# Patient Record
Sex: Male | Born: 1950
Health system: Southern US, Community
[De-identification: ages and names within clinical notes are randomized; demographics above are authoritative.]

## PROBLEM LIST (undated history)

## (undated) DIAGNOSIS — Z860100 Personal history of colon polyps, unspecified: Secondary | ICD-10-CM

## (undated) DIAGNOSIS — E785 Hyperlipidemia, unspecified: Secondary | ICD-10-CM

## (undated) DIAGNOSIS — E559 Vitamin D deficiency, unspecified: Secondary | ICD-10-CM

## (undated) DIAGNOSIS — G5603 Carpal tunnel syndrome, bilateral upper limbs: Secondary | ICD-10-CM

## (undated) DIAGNOSIS — T7840XA Allergy, unspecified, initial encounter: Secondary | ICD-10-CM

## (undated) DIAGNOSIS — G4730 Sleep apnea, unspecified: Secondary | ICD-10-CM

## (undated) DIAGNOSIS — I10 Essential (primary) hypertension: Secondary | ICD-10-CM

## (undated) DIAGNOSIS — S8290XA Unspecified fracture of unspecified lower leg, initial encounter for closed fracture: Secondary | ICD-10-CM

## (undated) HISTORY — DX: Allergy, unspecified, initial encounter: T78.40XA

## (undated) HISTORY — PX: TOTAL ANKLE REPLACEMENT: SUR1218

## (undated) HISTORY — DX: Vitamin D deficiency, unspecified: E55.9

## (undated) HISTORY — DX: Personal history of colon polyps, unspecified: Z86.0100

## (undated) HISTORY — DX: Essential (primary) hypertension: I10

## (undated) HISTORY — DX: Hyperlipidemia, unspecified: E78.5

## (undated) HISTORY — PX: CYST REMOVAL HAND: SHX6279

## (undated) HISTORY — DX: Personal history of colonic polyps: Z86.010

## (undated) HISTORY — DX: Unspecified fracture of unspecified lower leg, initial encounter for closed fracture: S82.90XA

## (undated) HISTORY — DX: Carpal tunnel syndrome, bilateral upper limbs: G56.03

## (undated) HISTORY — DX: Sleep apnea, unspecified: G47.30

---

## 1969-02-12 HISTORY — PX: LEG SURGERY: SHX1003

## 1997-02-12 HISTORY — PX: SPINE SURGERY: SHX786

## 1997-09-06 ENCOUNTER — Encounter: Admission: RE | Admit: 1997-09-06 | Discharge: 1997-12-05 | Payer: Self-pay | Admitting: Neurological Surgery

## 1999-06-27 ENCOUNTER — Emergency Department (HOSPITAL_COMMUNITY): Admission: EM | Admit: 1999-06-27 | Discharge: 1999-06-27 | Payer: Self-pay | Admitting: Emergency Medicine

## 1999-06-27 ENCOUNTER — Encounter: Payer: Self-pay | Admitting: Emergency Medicine

## 1999-09-06 ENCOUNTER — Encounter: Payer: Self-pay | Admitting: Neurological Surgery

## 1999-09-06 ENCOUNTER — Encounter: Admission: RE | Admit: 1999-09-06 | Discharge: 1999-09-06 | Payer: Self-pay | Admitting: Neurological Surgery

## 1999-09-27 ENCOUNTER — Encounter: Payer: Self-pay | Admitting: Neurological Surgery

## 1999-09-27 ENCOUNTER — Ambulatory Visit (HOSPITAL_COMMUNITY): Admission: RE | Admit: 1999-09-27 | Discharge: 1999-09-27 | Payer: Self-pay | Admitting: Neurological Surgery

## 2001-07-14 ENCOUNTER — Encounter: Payer: Self-pay | Admitting: Orthopedic Surgery

## 2001-07-14 ENCOUNTER — Ambulatory Visit (HOSPITAL_COMMUNITY): Admission: RE | Admit: 2001-07-14 | Discharge: 2001-07-14 | Payer: Self-pay | Admitting: Orthopedic Surgery

## 2010-01-19 ENCOUNTER — Emergency Department (HOSPITAL_COMMUNITY): Admission: EM | Admit: 2010-01-19 | Discharge: 2009-08-30 | Payer: Self-pay | Admitting: Emergency Medicine

## 2012-05-28 ENCOUNTER — Encounter: Payer: Self-pay | Admitting: Family Medicine

## 2012-05-28 ENCOUNTER — Ambulatory Visit (INDEPENDENT_AMBULATORY_CARE_PROVIDER_SITE_OTHER): Payer: 59 | Admitting: Family Medicine

## 2012-05-28 VITALS — BP 133/83 | HR 69 | Temp 97.4°F | Ht 67.0 in | Wt 207.8 lb

## 2012-05-28 DIAGNOSIS — Z23 Encounter for immunization: Secondary | ICD-10-CM

## 2012-05-28 DIAGNOSIS — W57XXXA Bitten or stung by nonvenomous insect and other nonvenomous arthropods, initial encounter: Secondary | ICD-10-CM

## 2012-05-28 DIAGNOSIS — R5381 Other malaise: Secondary | ICD-10-CM

## 2012-05-28 DIAGNOSIS — E559 Vitamin D deficiency, unspecified: Secondary | ICD-10-CM

## 2012-05-28 DIAGNOSIS — Z139 Encounter for screening, unspecified: Secondary | ICD-10-CM

## 2012-05-28 DIAGNOSIS — T148 Other injury of unspecified body region: Secondary | ICD-10-CM

## 2012-05-28 DIAGNOSIS — Z2911 Encounter for prophylactic immunotherapy for respiratory syncytial virus (RSV): Secondary | ICD-10-CM

## 2012-05-28 DIAGNOSIS — R5383 Other fatigue: Secondary | ICD-10-CM

## 2012-05-28 DIAGNOSIS — E785 Hyperlipidemia, unspecified: Secondary | ICD-10-CM

## 2012-05-28 LAB — POCT CBC
Hemoglobin: 14.9 g/dL (ref 14.1–18.1)
MPV: 7.3 fL (ref 0–99.8)
POC Granulocyte: 4.4 (ref 2–6.9)
Platelet Count, POC: 244 10*3/uL (ref 142–424)
RBC: 5.1 M/uL (ref 4.69–6.13)

## 2012-05-28 LAB — HEPATIC FUNCTION PANEL
Albumin: 4.4 g/dL (ref 3.5–5.2)
Alkaline Phosphatase: 76 U/L (ref 39–117)
Indirect Bilirubin: 0.5 mg/dL (ref 0.0–0.9)
Total Bilirubin: 0.7 mg/dL (ref 0.3–1.2)

## 2012-05-28 LAB — BASIC METABOLIC PANEL WITH GFR
Chloride: 101 mEq/L (ref 96–112)
GFR, Est Non African American: >89 mL/min
Potassium: 4.5 mEq/L (ref 3.5–5.3)

## 2012-05-28 MED ORDER — ZOSTER VACCINE LIVE 19400 UNT/0.65ML ~~LOC~~ SOLR: 0.6500 mL | Freq: Once | SUBCUTANEOUS | Status: DC

## 2012-05-28 NOTE — Patient Instructions (Addendum)
Continue current meds and therapeutic lifestyle changes Herpes Zoster Virus Vaccine What is this medicine? HERPES ZOSTER VIRUS VACCINE (HUR peez ZOS ter vahy ruhs vak SEEN) is a vaccine. It is used to prevent shingles in adults 64 years old and over. This vaccine is not used to treat shingles or nerve pain from shingles. This medicine may be used for other purposes; ask your health care provider or pharmacist if you have questions. What should I tell my health care provider before I take this medicine? They need to know if you have any of these conditions: -cancer like leukemia or lymphoma -immune system problems or therapy -infection with fever -tuberculosis -an unusual or allergic reaction to vaccines, neomycin, gelatin, other medicines, foods, dyes, or preservatives -pregnant or trying to get pregnant -breast-feeding How should I use this medicine? This vaccine is for injection under the skin. It is given by a health care professional. Talk to your pediatrician regarding the use of this medicine in children. This medicine is not approved for use in children. Overdosage: If you think you have taken too much of this medicine contact a poison control center or emergency room at once. NOTE: This medicine is only for you. Do not share this medicine with others. What if I miss a dose? This does not apply. What may interact with this medicine? Do not take this medicine with any of the following medications: -adalimumab -anakinra -etanercept -infliximab -medicines to treat cancer -medicines that suppress your immune system This medicine may also interact with the following medications: -immunoglobulins -steroid medicines like prednisone or cortisone This list may not describe all possible interactions. Give your health care provider a list of all the medicines, herbs, non-prescription drugs, or dietary supplements you use. Also tell them if you smoke, drink alcohol, or use illegal drugs.  Some items may interact with your medicine. What should I watch for while using this medicine? Visit your doctor for regular check ups. This vaccine, like all vaccines, may not fully protect everyone. After receiving this vaccine it may be possible to pass chickenpox infection to others. Avoid people with immune system problems, pregnant women who have not had chickenpox, and newborns of women who have not had chickenpox. Talk to your doctor for more information. What side effects may I notice from receiving this medicine? Side effects that you should report to your doctor or health care professional as soon as possible: -allergic reactions like skin rash, itching or hives, swelling of the face, lips, or tongue -breathing problems -feeling faint or lightheaded, falls -fever, flu-like symptoms -pain, tingling, numbness in the hands or feet -swelling of the ankles, feet, hands -unusually weak or tired Side effects that usually do not require medical attention (report to your doctor or health care professional if they continue or are bothersome): -aches or pains -chickenpox-like rash -diarrhea -headache -loss of appetite -nausea, vomiting -redness, pain, swelling at site where injected -runny nose This list may not describe all possible side effects. Call your doctor for medical advice about side effects. You may report side effects to FDA at 1-800-FDA-1088. Where should I keep my medicine? This drug is given in a hospital or clinic and will not be stored at home. NOTE: This sheet is a summary. It may not cover all possible information. If you have questions about this medicine, talk to your doctor, pharmacist, or health care provider.  2013, [employer]vier/[employer]. (07/18/2009 5:43:50 PM)

## 2012-05-28 NOTE — Progress Notes (Signed)
  Subjective:    Patient ID: Daniel Macias, male    DOB: 1950-11-14, 62 y.o.   MRN: [id number]  HPI This patient presents for recheck of multiple medical problems. No one accompanies the patient today.  Patient Active Problem List  Diagnosis  . Hyperlipemia      The allergies, current medications, past medical history, surgical history, family and social history are reviewed.  Immunizations reviewed.  Health maintenance reviewed.  The following items are outstanding: Tdap and Zostavax      Review of Systems  HENT: Negative.   Eyes: Negative.   Respiratory: Negative.   Cardiovascular: Negative.   Gastrointestinal: Negative.   Genitourinary: Negative.   Musculoskeletal: Negative.   Skin: Negative.   Neurological: Negative.   Psychiatric/Behavioral: Negative.        Objective:   Physical Exam BP 133/83  Pulse 69  Temp(Src) 97.4 F (36.3 C) (Oral)  Ht 5\' 7"  (1.702 m)  Wt 207 lb 12.8 oz (94.257 kg)  BMI 32.54 kg/m2  The patient appeared well nourished and normally developed, alert and oriented to time and place. Speech, behavior and judgement appear normal. Vital signs as documented.  Head exam is unremarkable. No scleral icterus or pallor noted. Slight nasal congestion. Neck is without jugular venous distension, thyromegally, or carotid bruits. Carotid upstrokes are brisk bilaterally. No cervical adenopathy. Lungs are clear anteriorly and posteriorly to auscultation. Normal respiratory effort. Cardiac exam reveals regular rate and rhythm @ 72/min. First and second heart sounds normal. No murmurs, rubs or gallops.  Abdominal exam reveals normal bowl sounds, no masses, no organomegaly and no aortic enlargement. No inguinal adenopathy. Extremities are nonedematous and both femoral  pulses are normal. Skin without pallor or jaundice.  Warm and dry, without rash. Multiple bite sites on the trunk  from tick removal. Neurologic exam reveals normal deep tendon reflexes  and normal sensation.          Assessment & Plan:  1. Hyperlipemia - Hepatic function panel; Standing - NMR Lipoprofile with Lipids; Standing  2. Screening - POCT CBC; Standing - BASIC METABOLIC PANEL WITH GFR; Standing  3. Vitamin D deficiency disease - Vitamin D 25 hydroxy; Standing  4.Tick bites  5.Fatigue -Sleep apnea evaluation to be ordered  6. We'll give shingles shot today     Check on the state registry for Tdap

## 2012-05-29 LAB — NMR LIPOPROFILE WITH LIPIDS
HDL Size: 9.2 nm (ref >=9.2)
HDL-C: 59 mg/dL (ref >=40)
LDL (calc): 51 mg/dL (ref <100)
LDL Particle Number: 711 nmol/L (ref <1000)
Triglycerides: 83 mg/dL (ref <150)
VLDL Size: 46.3 nm (ref <=46.6)

## 2012-05-29 LAB — VITAMIN D 25 HYDROXY (VIT D DEFICIENCY, FRACTURES): Vit D, 25-Hydroxy: 24 ng/mL — ABNORMAL LOW (ref 30–89)

## 2012-06-03 ENCOUNTER — Encounter: Payer: Self-pay | Admitting: *Deleted

## 2012-06-03 ENCOUNTER — Ambulatory Visit: Payer: 59 | Attending: Family Medicine | Admitting: Sleep Medicine

## 2012-06-03 VITALS — Ht 68.0 in | Wt 201.0 lb

## 2012-06-03 DIAGNOSIS — R5383 Other fatigue: Secondary | ICD-10-CM

## 2012-06-03 DIAGNOSIS — G4733 Obstructive sleep apnea (adult) (pediatric): Secondary | ICD-10-CM | POA: Insufficient documentation

## 2012-06-03 DIAGNOSIS — Z6831 Body mass index (BMI) 31.0-31.9, adult: Secondary | ICD-10-CM | POA: Insufficient documentation

## 2012-06-03 NOTE — Progress Notes (Signed)
Quick Note:  Letter of blood work results sent to patientt ______

## 2012-06-04 NOTE — Progress Notes (Signed)
Split night protocol not met d/t AHI <15 in 1st 2 hrs total sleep time and at @2  AM (cut off time for Split night)

## 2012-06-07 NOTE — Procedures (Signed)
[facility] Daniel Toto A. Gerilyn Pilgrim, MD     [email or web address]        NAMERYKIN, ROUTE              ACCOUNT NO.:  [id number]  MEDICAL RECORD NO.:  [id number]          PATIENT TYPE:  OUT  LOCATION:  SLEEP LAB                     FACILITY:  [facility]  PHYSICIAN:  Taksh Hjort A. Gerilyn Macias, M.D. DATE OF BIRTH:  1950/04/07  DATE OF STUDY:  06/03/2012                           NOCTURNAL POLYSOMNOGRAM  REFERRING PHYSICIAN:  Ernestina Penna, M.D.  INDICATIONS:  This is a 62 year old man who presents with restless sleep hypersomnia and snoring.  MEDICATIONS:  Vitamin D and Crestor.  EPWORTH SLEEPINESS SCALE:  18.  BMI:  31.  ARCHITECTURAL SUMMARY:  The total recording time is 421 minutes, sleep latency is 9 minutes.  REM latency 75 minutes.  Stage N1 11%, N2 69%, N3 3%, and REM sleep 17%.  RESPIRATORY SUMMARY:  Baseline oxygen saturation is 94%, lowest saturation 74% during REM sleep.  Diagnostic AHI is 13 and RDI 14.  The patient had more events during REM sleep associated with even lower desaturation.  The REM AHI is 30.  LIMB MOVEMENT SUMMARY:  PLM index 0.  ELECTROCARDIOGRAM SUMMARY:  Average heart rate is 77 with no significant dysrhythmias observed.  IMPRESSION:  Mild-to-moderate obstructive sleep apnea syndrome worse during REM sleep.  RECOMMENDATION:  Formal CPAP titration study.  Thanks for this referral.    Lanisha Stepanian A. Gerilyn Macias, M.D.    KAD/MEDQ  D:  06/07/2012 19:43:35  T:  06/07/2012 16:10:96  Job:  [id number]

## 2012-06-11 ENCOUNTER — Telehealth: Payer: Self-pay | Admitting: Family Medicine

## 2012-06-12 NOTE — Telephone Encounter (Signed)
Pt notified of results of labs and sleep study RX for Vit D called into [facility]

## 2012-10-01 ENCOUNTER — Encounter: Payer: Self-pay | Admitting: *Deleted

## 2012-11-25 ENCOUNTER — Encounter (INDEPENDENT_AMBULATORY_CARE_PROVIDER_SITE_OTHER): Payer: Self-pay

## 2012-11-25 ENCOUNTER — Encounter: Payer: Self-pay | Admitting: Family Medicine

## 2012-11-25 ENCOUNTER — Ambulatory Visit (INDEPENDENT_AMBULATORY_CARE_PROVIDER_SITE_OTHER): Payer: 59 | Admitting: Family Medicine

## 2012-11-25 VITALS — BP 128/84 | HR 91 | Temp 97.3°F | Ht 68.0 in | Wt 208.0 lb

## 2012-11-25 DIAGNOSIS — E785 Hyperlipidemia, unspecified: Secondary | ICD-10-CM

## 2012-11-25 DIAGNOSIS — Z0000 Encounter for general adult medical examination without abnormal findings: Secondary | ICD-10-CM

## 2012-11-25 DIAGNOSIS — G4733 Obstructive sleep apnea (adult) (pediatric): Secondary | ICD-10-CM | POA: Insufficient documentation

## 2012-11-25 DIAGNOSIS — R7309 Other abnormal glucose: Secondary | ICD-10-CM

## 2012-11-25 DIAGNOSIS — N400 Enlarged prostate without lower urinary tract symptoms: Secondary | ICD-10-CM | POA: Insufficient documentation

## 2012-11-25 DIAGNOSIS — G4730 Sleep apnea, unspecified: Secondary | ICD-10-CM

## 2012-11-25 DIAGNOSIS — R799 Abnormal finding of blood chemistry, unspecified: Secondary | ICD-10-CM

## 2012-11-25 DIAGNOSIS — E559 Vitamin D deficiency, unspecified: Secondary | ICD-10-CM

## 2012-11-25 DIAGNOSIS — Z23 Encounter for immunization: Secondary | ICD-10-CM

## 2012-11-25 DIAGNOSIS — R7989 Other specified abnormal findings of blood chemistry: Secondary | ICD-10-CM

## 2012-11-25 LAB — POCT CBC
HCT, POC: 46 % (ref 43.5–53.7)
MCHC: 33.2 g/dL (ref 31.8–35.4)
MCV: 85.9 fL (ref 80–97)
RDW, POC: 12.8 %
WBC: 7.2 10*3/uL (ref 4.6–10.2)

## 2012-11-25 NOTE — Patient Instructions (Signed)
Continue current medications. Continue good therapeutic lifestyle changes.  Fall precautions discussed with patient. Follow up as planned and earlier as needed.   

## 2012-11-25 NOTE — Progress Notes (Signed)
Subjective:    Patient ID: Daniel Macias, male    DOB: January 28, 1951, 62 y.o.   MRN: [id number]  HPI Pt here for follow up and management of chronic medical problems and annual exam. Patient is doing well and he has no particular complaints. The patient's family history was reviewed with him today. Both parents died at 32 years of age. His father died of COPD. His mother had a history of hypertension and most likely died from metastatic uterine cancer .     Patient Active Problem List   Diagnosis Date Noted  . Hyperlipemia 05/28/2012  . Vitamin D deficiency disease 05/28/2012   Outpatient Encounter Prescriptions as of 11/25/2012  Medication Sig Dispense Refill  . cholecalciferol (VITAMIN D) 1000 UNITS tablet Take 1,000 Units by mouth daily.      . Probiotic Product (PROBIOTIC DAILY PO) Take 1 capsule by mouth daily.      . rosuvastatin (CRESTOR) 10 MG tablet Take 10 mg by mouth daily.       No facility-administered encounter medications on file as of 11/25/2012.    Review of Systems  Constitutional: Negative.   HENT: Negative.   Eyes: Negative.   Respiratory: Negative.   Cardiovascular: Negative.   Gastrointestinal: Negative.   Endocrine: Negative.   Genitourinary: Negative.   Musculoskeletal: Negative.   Skin: Negative.   Allergic/Immunologic: Negative.   Neurological: Negative.   Hematological: Negative.   Psychiatric/Behavioral: Negative.        Objective:   Physical Exam  Nursing note and vitals reviewed. Constitutional: He is oriented to person, place, and time. He appears well-developed and well-nourished.  HENT:  Head: Normocephalic and atraumatic.  Right Ear: External ear normal.  Left Ear: External ear normal.  Nose: Nose normal.  Mouth/Throat: Oropharynx is clear and moist. No oropharyngeal exudate.  Eyes: Conjunctivae and EOM are normal. Pupils are equal, round, and reactive to light. Right eye exhibits no discharge. Left eye exhibits no discharge. No  scleral icterus.  Neck: Normal range of motion. Neck supple. No tracheal deviation present. No thyromegaly present.  No carotid bruits  Cardiovascular: Normal rate, regular rhythm, normal heart sounds and intact distal pulses.  Exam reveals no gallop and no friction rub.   No murmur heard. At 72 per minute  Pulmonary/Chest: Effort normal and breath sounds normal. No respiratory distress. He has no wheezes. He has no rales. He exhibits no tenderness.  Abdominal: Soft. Bowel sounds are normal. He exhibits no distension and no mass. There is no tenderness. There is no rebound and no guarding.  Genitourinary: Rectum normal and penis normal.  Prostate was slightly enlarged. There were no nodules or lumps. There were no rectal masses. There was no inguinal hernia palpated.  Musculoskeletal: Normal range of motion. He exhibits no edema and no tenderness.  Lymphadenopathy:    He has no cervical adenopathy.  Neurological: He is alert and oriented to person, place, and time. He has normal reflexes. No cranial nerve deficit.  Skin: Skin is warm and dry. No rash noted. He is not diaphoretic. No erythema. No pallor.  Psychiatric: He has a normal mood and affect. His behavior is normal. Judgment and thought content normal.   BP 128/84  Pulse 91  Temp(Src) 97.3 F (36.3 C) (Oral)  Ht 5\' 8"  (1.727 m)  Wt 208 lb (94.348 kg)  BMI 31.63 kg/m2        Assessment & Plan:   1. Annual physical exam   2. Vitamin D deficiency disease  3. Hyperlipidemia   4. Need for Tdap vaccination   5. Unspecified sleep apnea   6. BPH (benign prostatic hyperplasia)   7. Obstructive sleep apnea    Orders Placed This Encounter  Procedures  . Tdap vaccine greater than or equal to 7yo IM  . Hepatic function panel  . BMP8+EGFR  . NMR, lipoprofile  . Vit D  25 hydroxy (rtn osteoporosis monitoring)  . PSA, total and free  . POCT CBC  . Cpap titration    Standing Status: Future     Number of Occurrences:       Standing Expiration Date: 11/25/2013    Order Specific Question:  Where should this test be performed:    Answer:  [facility]   Meds ordered this encounter  Medications  . Probiotic Product (PROBIOTIC DAILY PO)    Sig: Take 1 capsule by mouth daily.   Patient Instructions  Continue current medications. Continue good therapeutic lifestyle changes.  Fall precautions discussed with patient. Follow up as planned and earlier as needed.     We will arrange for followup visit with the neurologist for the additional sleep study that is needed  Nyra Capes MD

## 2012-11-27 ENCOUNTER — Ambulatory Visit: Payer: 59 | Admitting: Family Medicine

## 2012-11-27 LAB — NMR, LIPOPROFILE
HDL Particle Number: 40.9 umol/L (ref >=30.5)
LDL Size: 21.5 nm (ref >20.5)
LDLC SERPL CALC-MCNC: 63 mg/dL (ref <100)
LP-IR Score: 34 (ref <=45)

## 2012-11-27 LAB — BMP8+EGFR
BUN/Creatinine Ratio: 16 (ref 10–22)
BUN: 15 mg/dL (ref 8–27)
CO2: 22 mmol/L (ref 18–29)
Chloride: 103 mmol/L (ref 97–108)
Glucose: 122 mg/dL — ABNORMAL HIGH (ref 65–99)
Potassium: 5 mmol/L (ref 3.5–5.2)

## 2012-11-27 LAB — HEPATIC FUNCTION PANEL
AST: 21 IU/L (ref 0–40)
Albumin: 4.6 g/dL (ref 3.6–4.8)
Alkaline Phosphatase: 85 IU/L (ref 39–117)
Bilirubin, Direct: 0.11 mg/dL (ref 0.00–0.40)

## 2012-11-27 LAB — PSA, TOTAL AND FREE: PSA, Free Pct: 41.7 %

## 2012-11-27 LAB — VITAMIN D 25 HYDROXY (VIT D DEFICIENCY, FRACTURES): Vit D, 25-Hydroxy: 31.9 ng/mL (ref 30.0–100.0)

## 2012-12-02 ENCOUNTER — Other Ambulatory Visit: Payer: Self-pay | Admitting: Family Medicine

## 2012-12-02 LAB — POCT GLYCOSYLATED HEMOGLOBIN (HGB A1C): Hemoglobin A1C: 6.4

## 2012-12-02 NOTE — Telephone Encounter (Signed)
Last seen 11/25/12 [facility]  Last Vit D level 31.9  10/14

## 2012-12-02 NOTE — Addendum Note (Signed)
Addended by: Monica Becton on: 12/02/2012 06:41 PM   Modules accepted: Orders

## 2012-12-03 ENCOUNTER — Encounter: Payer: Self-pay | Admitting: *Deleted

## 2013-02-17 ENCOUNTER — Telehealth: Payer: Self-pay | Admitting: Family Medicine

## 2013-02-17 NOTE — Telephone Encounter (Signed)
appt 1/7 with moore

## 2013-02-18 ENCOUNTER — Encounter: Payer: Self-pay | Admitting: Family Medicine

## 2013-02-18 ENCOUNTER — Ambulatory Visit (INDEPENDENT_AMBULATORY_CARE_PROVIDER_SITE_OTHER): Payer: 59 | Admitting: Family Medicine

## 2013-02-18 VITALS — BP 143/83 | HR 85 | Temp 97.6°F | Ht 68.0 in | Wt 212.0 lb

## 2013-02-18 DIAGNOSIS — R51 Headache: Secondary | ICD-10-CM

## 2013-02-18 DIAGNOSIS — I10 Essential (primary) hypertension: Secondary | ICD-10-CM

## 2013-02-18 DIAGNOSIS — J329 Chronic sinusitis, unspecified: Secondary | ICD-10-CM

## 2013-02-18 DIAGNOSIS — J3489 Other specified disorders of nose and nasal sinuses: Secondary | ICD-10-CM

## 2013-02-18 DIAGNOSIS — R0981 Nasal congestion: Secondary | ICD-10-CM

## 2013-02-18 DIAGNOSIS — R05 Cough: Secondary | ICD-10-CM

## 2013-02-18 DIAGNOSIS — R059 Cough, unspecified: Secondary | ICD-10-CM

## 2013-02-18 MED ORDER — FLUTICASONE PROPIONATE 50 MCG/ACT NA SUSP: 2.0000 | Freq: Every day | NASAL | Status: DC

## 2013-02-18 MED ORDER — LISINOPRIL 10 MG PO TABS: 10.0000 mg | ORAL_TABLET | Freq: Every day | ORAL | Status: DC

## 2013-02-18 MED ORDER — AZELASTINE HCL 0.1 % NA SOLN: 2.0000 | Freq: Every day | NASAL | Status: DC

## 2013-02-18 MED ORDER — AMOXICILLIN 500 MG PO CAPS: 500.0000 mg | ORAL_CAPSULE | Freq: Three times a day (TID) | ORAL | Status: DC

## 2013-02-18 NOTE — Patient Instructions (Signed)
Take medications as directed, continue to watch salt intake Return to clinic in 2-4 weeks and get a BMP and have the nurse check your blood pressure and bring blood pressure readings from home at that time. Bring your monitor with you when you come back so we can compare it his accuracy to our blood pressure monitor Continue to drink plenty of fluids

## 2013-02-18 NOTE — Progress Notes (Signed)
Subjective:    Patient ID: Daniel Macias, male    DOB: 11/21/1950, 63 y.o.   MRN: [id number]  HPI Patient here today for sinus issues and headache for approximately 3 weeks. He has been taking a lot of Advil for the headaches. His blood pressures at home have been running anywhere from 140-150/90. I. he is sunburned recently and it was 150/76. He was able to monitor blood pressures at home. He watches his salt intake. The drainage from the congestion is green in color. And he does have a slight cough.       Patient Active Problem List   Diagnosis Date Noted  . BPH (benign prostatic hyperplasia) 11/25/2012  . Obstructive sleep apnea 11/25/2012  . Hyperlipemia 05/28/2012  . Vitamin D deficiency disease 05/28/2012   Outpatient Encounter Prescriptions as of 02/18/2013  Medication Sig  . cholecalciferol (VITAMIN D) 1000 UNITS tablet Take 1,000 Units by mouth daily.  . Probiotic Product (PROBIOTIC DAILY PO) Take 1 capsule by mouth daily.  . rosuvastatin (CRESTOR) 10 MG tablet Take 10 mg by mouth daily.  . Vitamin D, Ergocalciferol, (DRISDOL) 50000 UNITS CAPS capsule TAKE ONE CAPSULE EVERY WEEK    Review of Systems  Constitutional: Negative.   HENT: Positive for sinus pressure. Negative for ear pain.   Eyes: Negative.  Negative for pain and itching.  Respiratory: Negative.  Negative for cough.   Cardiovascular: Negative.   Gastrointestinal: Negative.   Endocrine: Negative.   Genitourinary: Negative.   Musculoskeletal: Negative.   Skin: Negative.   Allergic/Immunologic: Negative.   Neurological: Positive for headaches. Negative for dizziness.  Hematological: Negative.   Psychiatric/Behavioral: Negative.        Objective:   Physical Exam  Nursing note and vitals reviewed. Constitutional: He is oriented to person, place, and time. He appears well-developed and well-nourished. No distress.  HENT:  Head: Normocephalic and atraumatic.  Right Ear: External ear normal.  Left  Ear: External ear normal.  Nose: Nose normal.  Mouth/Throat: Oropharynx is clear and moist. No oropharyngeal exudate.  Nasal congestion bilaterally and redness and posterior throat.  Eyes: Conjunctivae and EOM are normal. Pupils are equal, round, and reactive to light. Right eye exhibits no discharge. Left eye exhibits no discharge. No scleral icterus.  Neck: Normal range of motion. Neck supple. No thyromegaly present.  Cardiovascular: Normal rate, regular rhythm, normal heart sounds and intact distal pulses.  Exam reveals no gallop and no friction rub.   No murmur heard. Pulmonary/Chest: Effort normal and breath sounds normal. No respiratory distress. He has no wheezes. He has no rales. He exhibits no tenderness.  Dry cough  Abdominal: Soft. Bowel sounds are normal.  Genitourinary: Rectum normal, prostate normal and penis normal.  Musculoskeletal: Normal range of motion.  Lymphadenopathy:    He has no cervical adenopathy.  Neurological: He is alert and oriented to person, place, and time. He has normal reflexes.  Skin: Skin is warm and dry. No rash noted.  Psychiatric: He has a normal mood and affect. His behavior is normal. Judgment and thought content normal.   BP 143/83  Pulse 85  Temp(Src) 97.6 F (36.4 C) (Oral)  Ht 5\' 8"  (1.727 m)  Wt 212 lb (96.163 kg)  BMI 32.24 kg/m2        Assessment & Plan:  1. Rhinosinusitis  2. Hypertension  3. Headache(784.0)  4. Head congestion  5. Cough  Flonase, Astelin, amoxicillin, and lisinopril 10, were called in to the pharmacy  Patient Instructions  Take medications as directed, continue to watch salt intake Return to clinic in 2-4 weeks and get a BMP and have the nurse check your blood pressure and bring blood pressure readings from home at that time. Bring your monitor with you when you come back so we can compare it his accuracy to our blood pressure monitor Continue to drink plenty of fluids     Arrie Senate MD

## 2013-02-26 ENCOUNTER — Other Ambulatory Visit: Payer: Self-pay | Admitting: Family Medicine

## 2013-04-21 ENCOUNTER — Telehealth: Payer: Self-pay | Admitting: Family Medicine

## 2013-04-22 MED ORDER — LISINOPRIL 10 MG PO TABS: 10.0000 mg | ORAL_TABLET | Freq: Every day | ORAL | Status: DC

## 2013-04-22 NOTE — Telephone Encounter (Signed)
Done, has appt next month

## 2013-04-23 ENCOUNTER — Other Ambulatory Visit: Payer: Self-pay | Admitting: Family Medicine

## 2013-05-27 ENCOUNTER — Encounter: Payer: Self-pay | Admitting: Family Medicine

## 2013-05-27 ENCOUNTER — Ambulatory Visit (INDEPENDENT_AMBULATORY_CARE_PROVIDER_SITE_OTHER): Payer: 59 | Admitting: Family Medicine

## 2013-05-27 VITALS — BP 131/85 | HR 75 | Temp 97.4°F | Ht 68.0 in | Wt 210.0 lb

## 2013-05-27 DIAGNOSIS — N400 Enlarged prostate without lower urinary tract symptoms: Secondary | ICD-10-CM

## 2013-05-27 DIAGNOSIS — E559 Vitamin D deficiency, unspecified: Secondary | ICD-10-CM

## 2013-05-27 DIAGNOSIS — E785 Hyperlipidemia, unspecified: Secondary | ICD-10-CM

## 2013-05-27 DIAGNOSIS — I10 Essential (primary) hypertension: Secondary | ICD-10-CM

## 2013-05-27 DIAGNOSIS — R000 Tachycardia, unspecified: Secondary | ICD-10-CM

## 2013-05-27 DIAGNOSIS — G4730 Sleep apnea, unspecified: Secondary | ICD-10-CM

## 2013-05-27 DIAGNOSIS — J309 Allergic rhinitis, unspecified: Secondary | ICD-10-CM

## 2013-05-27 DIAGNOSIS — E8881 Metabolic syndrome: Secondary | ICD-10-CM | POA: Insufficient documentation

## 2013-05-27 LAB — POCT CBC
Granulocyte percent: 65.9 %G (ref 37–80)
HCT, POC: 44.8 % (ref 43.5–53.7)
Hemoglobin: 14.6 g/dL (ref 14.1–18.1)
LYMPH, POC: 1.8 (ref 0.6–3.4)
MCH, POC: 28.1 pg (ref 27–31.2)
MCHC: 32.5 g/dL (ref 31.8–35.4)
MCV: 86.4 fL (ref 80–97)
MPV: 7.3 fL (ref 0–99.8)
POC Granulocyte: 3.7 (ref 2–6.9)
POC LYMPH %: 31.3 % (ref 10–50)
Platelet Count, POC: 217 10*3/uL (ref 142–424)
RBC: 5.2 M/uL (ref 4.69–6.13)
RDW, POC: 12.8 %
WBC: 5.6 10*3/uL (ref 4.6–10.2)

## 2013-05-27 LAB — POCT GLYCOSYLATED HEMOGLOBIN (HGB A1C): HEMOGLOBIN A1C: 6

## 2013-05-27 MED ORDER — ROSUVASTATIN CALCIUM 10 MG PO TABS: ORAL_TABLET | ORAL | Status: DC

## 2013-05-27 MED ORDER — LISINOPRIL 10 MG PO TABS: 10.0000 mg | ORAL_TABLET | Freq: Every day | ORAL | Status: DC

## 2013-05-27 NOTE — Addendum Note (Signed)
Addended by: Zannie Cove on: 05/27/2013 10:24 AM   Modules accepted: Orders

## 2013-05-27 NOTE — Patient Instructions (Addendum)
Continue current medications. Continue good therapeutic lifestyle changes which include good diet and exercise. Fall precautions discussed with patient. If an FOBT was given today- please return it to our front desk. If you are over 65 years old - you may need Prevnar 31 or the adult Pneumonia vaccine.  We will refill your lisinopril for one year one month at a time We will give you a 3 months supply of Crestor with a coupon card to help to save money Schedule you to see the pulmonologist because of a history of sleep apnea We will schedule you for a visit with the cardiologist because of these episodes of tachycardia Continue to avoid caffeine You will receive the Prevnar vaccine when you turn 63

## 2013-05-27 NOTE — Progress Notes (Signed)
Subjective:    Patient ID: Daniel Macias, male    DOB: [birth year], 63 y.o.   MRN: [id number]  HPI Pt here for follow up and management of chronic medical problems. The patient is doing well with no specific complaints. He will be given an FOBT to return today. He will also get lab work today. The patient will wait  until he is 83 to get his Prevnar vaccine. He has a history of obstructive sleep apnea but does not use a CPAP machine.       Patient Active Problem List   Diagnosis Date Noted  . BPH (benign prostatic hyperplasia) 11/25/2012  . Obstructive sleep apnea 11/25/2012  . Hyperlipemia 05/28/2012  . Vitamin D deficiency disease 05/28/2012   Outpatient Encounter Prescriptions as of 05/27/2013  Medication Sig  . CRESTOR 10 MG tablet TAKE 1 TABLET EVERY DAY  . lisinopril (PRINIVIL,ZESTRIL) 10 MG tablet Take 1 tablet (10 mg total) by mouth daily.  . Vitamin D, Ergocalciferol, (DRISDOL) 50000 UNITS CAPS capsule TAKE ONE CAPSULE EVERY WEEK  . [DISCONTINUED] cholecalciferol (VITAMIN D) 1000 UNITS tablet Take 1,000 Units by mouth daily.  Marland Kitchen azelastine (ASTELIN) 137 MCG/SPRAY nasal spray Place 2 sprays into both nostrils at bedtime. Use in each nostril as directed  . fluticasone (FLONASE) 50 MCG/ACT nasal spray Place 2 sprays into both nostrils at bedtime.  . [DISCONTINUED] amoxicillin (AMOXIL) 500 MG capsule Take 1 capsule (500 mg total) by mouth 3 (three) times daily.  . [DISCONTINUED] Probiotic Product (PROBIOTIC DAILY PO) Take 1 capsule by mouth daily.    Review of Systems  Constitutional: Negative.   HENT: Negative.   Eyes: Negative.   Respiratory: Negative.   Cardiovascular: Negative.   Gastrointestinal: Negative.   Endocrine: Negative.   Genitourinary: Negative.   Musculoskeletal: Negative.   Skin: Negative.   Allergic/Immunologic: Negative.   Neurological: Negative.   Hematological: Negative.   Psychiatric/Behavioral: Negative.        Objective:   Physical Exam   Nursing note and vitals reviewed. Constitutional: He is oriented to person, place, and time. He appears well-developed and well-nourished. No distress.  HENT:  Head: Normocephalic and atraumatic.  Right Ear: External ear normal.  Left Ear: External ear normal.  Mouth/Throat: Oropharynx is clear and moist. No oropharyngeal exudate.  Nasal congestion and rhinitis left greater than right  Eyes: Conjunctivae and EOM are normal. Pupils are equal, round, and reactive to light. Right eye exhibits no discharge. Left eye exhibits no discharge. No scleral icterus.  Neck: Normal range of motion. Neck supple. No thyromegaly present.  Cardiovascular: Normal rate, regular rhythm, normal heart sounds and intact distal pulses.  Exam reveals no gallop and no friction rub.   No murmur heard. At 84 per minute  Pulmonary/Chest: Effort normal and breath sounds normal. No respiratory distress. He has no wheezes. He has no rales. He exhibits no tenderness.  Abdominal: Soft. Bowel sounds are normal. He exhibits no mass. There is no tenderness. There is no rebound and no guarding.  Musculoskeletal: Normal range of motion.  Lymphadenopathy:    He has no cervical adenopathy.  Neurological: He is alert and oriented to person, place, and time. He has normal reflexes. No cranial nerve deficit.  Skin: Skin is warm and dry. No rash noted. No erythema. No pallor.  Psychiatric: He has a normal mood and affect. His behavior is normal. Judgment and thought content normal.   BP 131/85  Pulse 75  Temp(Src) 97.4 F (36.3 C) (Oral)  Ht '5\' 8"'  (1.727 m)  Wt 210 lb (95.255 kg)  BMI 31.94 kg/m2        Assessment & Plan:  1. BPH (benign prostatic hyperplasia) - POCT CBC  2. Hyperlipemia - POCT CBC - BMP8+EGFR - Hepatic function panel - NMR, lipoprofile  3. Vitamin D deficiency disease - POCT CBC - Vit D  25 hydroxy (rtn osteoporosis monitoring)  4. Metabolic syndrome - POCT glycosylated hemoglobin (Hb  A1C)  5. Allergic rhinitis  6. Hypertension  7. Tachycardia -Appointment with cardiology  Meds ordered this encounter  Medications  . lisinopril (PRINIVIL,ZESTRIL) 10 MG tablet    Sig: Take 1 tablet (10 mg total) by mouth daily.    Dispense:  30 tablet    Refill:  11  . rosuvastatin (CRESTOR) 10 MG tablet    Sig: TAKE 1 TABLET EVERY DAY    Dispense:  90 tablet    Refill:  0   Patient Instructions  Continue current medications. Continue good therapeutic lifestyle changes which include good diet and exercise. Fall precautions discussed with patient. If an FOBT was given today- please return it to our front desk. If you are over 60 years old - you may need Prevnar 60 or the adult Pneumonia vaccine.  We will refill your lisinopril for one year one month at a time We will give you a 3 months supply of Crestor with a coupon card to help to save money Schedule you to see the pulmonologist because of a history of sleep apnea We will schedule you for a visit with the cardiologist because of these episodes of tachycardia Continue to avoid caffeine You will receive the Prevnar vaccine when you turn 63    Arrie Senate MD

## 2013-05-28 LAB — NMR, LIPOPROFILE
Cholesterol: 133 mg/dL (ref <200)
HDL Cholesterol by NMR: 54 mg/dL (ref >=40)
HDL Particle Number: 41 umol/L (ref >=30.5)
LDL Particle Number: 729 nmol/L (ref <1000)
LDL Size: 21.2 nm (ref >20.5)
LDLC SERPL CALC-MCNC: 63 mg/dL (ref <100)
LP-IR Score: <25 (ref <=45)
Small LDL Particle Number: 340 nmol/L (ref <=527)
Triglycerides by NMR: 79 mg/dL (ref <150)

## 2013-05-28 LAB — HEPATIC FUNCTION PANEL
ALT: 27 IU/L (ref 0–44)
AST: 24 IU/L (ref 0–40)
Albumin: 4.3 g/dL (ref 3.6–4.8)
Alkaline Phosphatase: 72 IU/L (ref 39–117)
Bilirubin, Direct: 0.13 mg/dL (ref 0.00–0.40)
Total Bilirubin: 0.4 mg/dL (ref 0.0–1.2)
Total Protein: 6.9 g/dL (ref 6.0–8.5)

## 2013-05-28 LAB — BMP8+EGFR
BUN/Creatinine Ratio: 14 (ref 10–22)
BUN: 12 mg/dL (ref 8–27)
CO2: 26 mmol/L (ref 18–29)
Calcium: 9.1 mg/dL (ref 8.6–10.2)
Chloride: 101 mmol/L (ref 97–108)
Creatinine, Ser: 0.88 mg/dL (ref 0.76–1.27)
GFR calc Af Amer: 105 mL/min/{1.73_m2} (ref >59)
GFR calc non Af Amer: 91 mL/min/{1.73_m2} (ref >59)
Glucose: 107 mg/dL — ABNORMAL HIGH (ref 65–99)
Potassium: 4.7 mmol/L (ref 3.5–5.2)
Sodium: 139 mmol/L (ref 134–144)

## 2013-05-28 LAB — VITAMIN D 25 HYDROXY (VIT D DEFICIENCY, FRACTURES): Vit D, 25-Hydroxy: 26.7 ng/mL — ABNORMAL LOW (ref 30.0–100.0)

## 2013-05-30 ENCOUNTER — Other Ambulatory Visit: Payer: Self-pay | Admitting: Family Medicine

## 2013-06-09 ENCOUNTER — Other Ambulatory Visit: Payer: 59

## 2013-06-09 NOTE — Progress Notes (Signed)
Pt came in for lab  only 

## 2013-06-11 LAB — FECAL OCCULT BLOOD, IMMUNOCHEMICAL: Fecal Occult Bld: NEGATIVE

## 2013-06-15 ENCOUNTER — Encounter: Payer: Self-pay | Admitting: *Deleted

## 2013-06-24 ENCOUNTER — Encounter: Payer: Self-pay | Admitting: Pulmonary Disease

## 2013-06-24 ENCOUNTER — Ambulatory Visit (INDEPENDENT_AMBULATORY_CARE_PROVIDER_SITE_OTHER): Payer: 59 | Admitting: Pulmonary Disease

## 2013-06-24 VITALS — BP 140/98 | HR 84 | Ht 68.0 in | Wt 214.0 lb

## 2013-06-24 DIAGNOSIS — G4733 Obstructive sleep apnea (adult) (pediatric): Secondary | ICD-10-CM

## 2013-06-24 NOTE — Progress Notes (Signed)
Chief Complaint  Patient presents with  . Sleep Study    referred by Dr. Laurance Flatten for OSA    History of Present Illness: Daniel Macias is a 63 y.o. male for evaluation of sleep problems.  He had a sleep study a year ago, and was found to have mild sleep apnea.  He has not been set up on therapy.  He was recently started on blood pressure medicine, and there was concern his apnea was contributing to this.  He has noticed more trouble with his sleep.  He snores, and is a restless sleeper.  He had back surgery years ago, and has trouble sleeping on his back.  He will get sleepy after lunch, and when watching TV.  He goes to sleep at 11 pm.  He falls asleep after 15 minutes.  He wakes up one or two times to switch positions.  He gets out of bed at 8 am.  He feels tired in the morning.  He denies morning headache.  He does not use anything to help him fall sleep or stay awake.  He denies sleep walking, sleep talking, bruxism, or nightmares.  There is no history of restless legs.  He denies sleep hallucinations, sleep paralysis, or cataplexy.  The Epworth score is 10 out of 24.  Tests: PSG 06/07/12 >> AHI 13, SaO2 74%  Daniel Macias  has a past medical history of Hyperlipidemia; Vitamin D deficiency; colonic polyps; Carpal tunnel syndrome, bilateral; and Allergy.  Daniel Macias  has past surgical history that includes Spine surgery (1999); Cyst removal hand (Right); and Leg Surgery (Left, 1971).  Prior to Admission medications   Medication Sig Start Date End Date Taking? Authorizing Provider  azelastine (ASTELIN) 137 MCG/SPRAY nasal spray Place 2 sprays into both nostrils at bedtime. Use in each nostril as directed 02/18/13  Yes Chipper Herb, MD  CRESTOR 10 MG tablet TAKE 1 TABLET EVERY DAY   Yes Chipper Herb, MD  fluticasone [facility]) 50 MCG/ACT nasal spray Place 2 sprays into both nostrils at bedtime. 02/18/13  Yes Chipper Herb, MD  lisinopril (PRINIVIL,ZESTRIL) 10 MG tablet Take 1  tablet (10 mg total) by mouth daily. 05/27/13  Yes Chipper Herb, MD  Vitamin D, Ergocalciferol, (DRISDOL) 50000 UNITS CAPS capsule TAKE ONE CAPSULE EVERY WEEK   Yes Chipper Herb, MD    Allergies  Allergen Reactions  . Morphine And Related Itching    His family history includes Cancer in his mother; Emphysema in his father; Hypertension in his mother.  He  reports that he has never smoked. He does not have any smokeless tobacco history on file. He reports that he drinks alcohol. He reports that he does not use illicit drugs.  Review of Systems  Constitutional: Negative for fever and unexpected weight change.  HENT: Negative for congestion, dental problem, ear pain, nosebleeds, postnasal drip, rhinorrhea, sinus pressure, sneezing, sore throat and trouble swallowing.   Eyes: Negative for redness and itching.  Respiratory: Negative for cough, chest tightness, shortness of breath and wheezing.   Cardiovascular: Negative for palpitations and leg swelling.  Gastrointestinal: Negative for nausea and vomiting.  Genitourinary: Negative for dysuria.  Musculoskeletal: Negative for joint swelling.  Skin: Negative for rash.  Neurological: Negative for headaches.  Hematological: Does not bruise/bleed easily.  Psychiatric/Behavioral: Negative for dysphoric mood. The patient is not nervous/anxious.    Physical Exam:  General - No distress ENT - No sinus tenderness, no oral exudate, no LAN, no  thyromegaly, TM clear, pupils equal/reactive, MP 3, extensive dental work Cardiac - s1s2 regular, no murmur, pulses symmetric Chest - No wheeze/rales/dullness, good air entry, normal respiratory excursion Back - No focal tenderness Abd - Soft, non-tender, no organomegaly, + bowel sounds Ext - No edema Neuro - Normal strength, cranial nerves intact Skin - No rashes Psych - Normal mood, and behavior  Assessment/plan:  Chesley Mires, M.D. Pager [phone]

## 2013-06-24 NOTE — Progress Notes (Deleted)
   Subjective:    Patient ID: Daniel Macias, male    DOB: 11/02/1950, 63 y.o.   MRN: [id number]  HPI    Review of Systems  Constitutional: Negative for fever and unexpected weight change.  HENT: Negative for congestion, dental problem, ear pain, nosebleeds, postnasal drip, rhinorrhea, sinus pressure, sneezing, sore throat and trouble swallowing.   Eyes: Negative for redness and itching.  Respiratory: Negative for cough, chest tightness, shortness of breath and wheezing.   Cardiovascular: Negative for palpitations and leg swelling.  Gastrointestinal: Negative for nausea and vomiting.  Genitourinary: Negative for dysuria.  Musculoskeletal: Negative for joint swelling.  Skin: Negative for rash.  Neurological: Negative for headaches.  Hematological: Does not bruise/bleed easily.  Psychiatric/Behavioral: Negative for dysphoric mood. The patient is not nervous/anxious.        Objective:   Physical Exam        Assessment & Plan:

## 2013-06-24 NOTE — Patient Instructions (Signed)
Will arrange for CPAP set up Follow up in 2 months 

## 2013-06-24 NOTE — Assessment & Plan Note (Signed)
He has snoring, sleep disruption, and daytime sleepiness.  He has history of hypertension.  His sleep study shows mild sleep apnea with oxygen desaturation.  I have reviewed the recent sleep study results with the patient.  We discussed how sleep apnea can affect various health problems including risks for hypertension, cardiovascular disease, and diabetes.  We also discussed how sleep disruption can increase risks for accident, such as while driving.  Weight loss as a means of improving sleep apnea was also reviewed.  Additional treatment options discussed were CPAP therapy, oral appliance, and surgical intervention.  Will arrange for auto CPAP set up.

## 2013-07-15 ENCOUNTER — Encounter: Payer: Self-pay | Admitting: *Deleted

## 2013-07-17 ENCOUNTER — Telehealth: Payer: Self-pay | Admitting: Cardiology

## 2013-07-22 ENCOUNTER — Institutional Professional Consult (permissible substitution): Payer: 59 | Admitting: Cardiology

## 2013-08-23 ENCOUNTER — Other Ambulatory Visit: Payer: Self-pay | Admitting: Family Medicine

## 2013-08-26 ENCOUNTER — Encounter (INDEPENDENT_AMBULATORY_CARE_PROVIDER_SITE_OTHER): Payer: Self-pay

## 2013-08-26 ENCOUNTER — Other Ambulatory Visit: Payer: Self-pay | Admitting: Family Medicine

## 2013-08-26 ENCOUNTER — Ambulatory Visit (INDEPENDENT_AMBULATORY_CARE_PROVIDER_SITE_OTHER): Payer: 59 | Admitting: Pulmonary Disease

## 2013-08-26 ENCOUNTER — Encounter: Payer: Self-pay | Admitting: Pulmonary Disease

## 2013-08-26 VITALS — BP 102/68 | HR 89 | Ht 68.0 in | Wt 207.0 lb

## 2013-08-26 DIAGNOSIS — G4733 Obstructive sleep apnea (adult) (pediatric): Secondary | ICD-10-CM

## 2013-08-26 DIAGNOSIS — Z9989 Dependence on other enabling machines and devices: Secondary | ICD-10-CM

## 2013-08-26 NOTE — Progress Notes (Signed)
Chief Complaint  Patient presents with  . Follow-up    Pt wearing cpap 6 hours per night, every night. Pt states he doesn't sleep on his back, has trouble keeping mask on when he lays on his side.      History of Present Illness: Daniel Macias is a 63 y.o. male with OSA.  He is sleeping 6 hours per night.  He has full face mask.  He uses machine every night.  His mask shifts when he sleeps on his side.  He feels more rested during the day.  TESTS: PSG 06/07/12 >> AHI 13, SaO2 74%  Daniel Macias  has a past medical history of Hyperlipidemia; Vitamin D deficiency; colonic polyps; Carpal tunnel syndrome, bilateral; and Allergy.  Daniel Macias  has past surgical history that includes Spine surgery (1999); Cyst removal hand (Right); and Leg Surgery (Left, 1971).  Prior to Admission medications   Medication Sig Start Date End Date Taking? Authorizing Provider  azelastine (ASTELIN) 137 MCG/SPRAY nasal spray Place 2 sprays into both nostrils at bedtime. Use in each nostril as directed 02/18/13  Yes Chipper Herb, MD  CRESTOR 10 MG tablet TAKE 1 TABLET EVERY DAY   Yes Chipper Herb, MD  fluticasone [facility]) 50 MCG/ACT nasal spray Place 2 sprays into both nostrils daily as needed. 02/18/13  Yes Chipper Herb, MD  lisinopril (PRINIVIL,ZESTRIL) 10 MG tablet Take 1 tablet (10 mg total) by mouth daily. 05/27/13  Yes Chipper Herb, MD  Vitamin D, Ergocalciferol, (DRISDOL) 50000 UNITS CAPS capsule TAKE ONE CAPSULE EVERY WEEK   Yes Chipper Herb, MD    Allergies  Allergen Reactions  . Morphine And Related Itching     Physical Exam:  General - No distress ENT - No sinus tenderness, no oral exudate, no LAN, MP 3, extensive dental work Cardiac - s1s2 regular, no murmur Chest - No wheeze/rales/dullness Back - No focal tenderness Abd - Soft, non-tender Ext - No edema Neuro - Normal strength Skin - No rashes Psych - normal mood, and behavior   Assessment/Plan:  Daniel Mires,  MD [place] [facility] Pager:  [phone]

## 2013-08-26 NOTE — Patient Instructions (Signed)
Will get report from CPAP machine Follow up in 1 year

## 2013-08-26 NOTE — Assessment & Plan Note (Signed)
He is compliant with CPAP and reports benefit.  Will get report from his machine.  Discussed the importance of allowing enough time to sleep.  Discussed option of using CPAP pillow to help with mask fit issues.

## 2013-09-01 ENCOUNTER — Other Ambulatory Visit: Payer: Self-pay | Admitting: Family Medicine

## 2013-09-16 ENCOUNTER — Telehealth: Payer: Self-pay | Admitting: Pulmonary Disease

## 2013-09-16 NOTE — Telephone Encounter (Signed)
Auto CPAP 08/11/13 to 09/09/13 >> used on 27 of 30 nights with average 6 hrs and 9 min.  Average AHI is 0.6 with median CPAP 11 cm H2O and 95 th percentile CPAP 13 cm H20.  Will have my nurse inform pt that CPAP reports shows excellent control of sleep apnea.  No change to current set up needed.

## 2013-09-17 NOTE — Telephone Encounter (Signed)
Results have been explained to patient, pt expressed understanding. Nothing further needed.  

## 2013-09-30 ENCOUNTER — Encounter: Payer: Self-pay | Admitting: Cardiology

## 2013-09-30 ENCOUNTER — Ambulatory Visit (INDEPENDENT_AMBULATORY_CARE_PROVIDER_SITE_OTHER): Payer: 59 | Admitting: Cardiology

## 2013-09-30 ENCOUNTER — Institutional Professional Consult (permissible substitution): Payer: 59 | Admitting: Cardiology

## 2013-09-30 ENCOUNTER — Other Ambulatory Visit: Payer: Self-pay | Admitting: Family Medicine

## 2013-09-30 VITALS — BP 116/75 | HR 81 | Ht 68.0 in | Wt 202.0 lb

## 2013-09-30 DIAGNOSIS — R002 Palpitations: Secondary | ICD-10-CM | POA: Insufficient documentation

## 2013-09-30 DIAGNOSIS — R000 Tachycardia, unspecified: Secondary | ICD-10-CM

## 2013-09-30 DIAGNOSIS — I10 Essential (primary) hypertension: Secondary | ICD-10-CM

## 2013-09-30 NOTE — Patient Instructions (Signed)
The current medical regimen is effective;  continue present plan and medications.  Follow up as needed 

## 2013-09-30 NOTE — Progress Notes (Signed)
HPI The patient presents for evaluation of palpitations. He has a history of occasional rapid heart rates. This typically happens when he has been active or at least making some sunlight getting out of his truck. He doesn't think he's had any episodes for about 4 months. He's had 3 or 4 episodes over the last 5 years. He will feel his heart racing like he ran a race. It'll last for about 45 minutes. He has not get chest pressure, neck or arm discomfort. He does not get particularly short of breath and has no PND or orthopnea. He has not had any presyncope or syncope. Actually since he started using CPAP recently he's had none of these episodes.  Allergies  Allergen Reactions  . Morphine And Related Itching    Current Outpatient Prescriptions  Medication Sig Dispense Refill  . azelastine (ASTELIN) 137 MCG/SPRAY nasal spray Place 2 sprays into both nostrils at bedtime. Use in each nostril as directed  30 mL  6  . CRESTOR 10 MG tablet TAKE 1 TABLET EVERY DAY  90 tablet  0  . fluticasone (FLONASE) 50 MCG/ACT nasal spray Place 2 sprays into both nostrils daily as needed.      Marland Kitchen lisinopril (PRINIVIL,ZESTRIL) 10 MG tablet Take 1 tablet (10 mg total) by mouth daily.  30 tablet  11  . Vitamin D, Ergocalciferol, (DRISDOL) 50000 UNITS CAPS capsule TAKE ONE CAPSULE EVERY WEEK  12 capsule  1   No current facility-administered medications for this visit.    Past Medical History  Diagnosis Date  . Hyperlipidemia   . Vitamin D deficiency   . Hx of colonic polyps   . Carpal tunnel syndrome, bilateral   . Allergy     Past Surgical History  Procedure Laterality Date  . Spine surgery  1999    DDD  . Cyst removal hand Right     wrist   . Leg surgery Left 1971    Family History  Problem Relation Age of Onset  . Hypertension Mother   . Cancer Mother     uterine  . Emphysema Father     History   Social History  . Marital Status: Married    Spouse Name: N/A    Number of Children: N/A  .  Years of Education: N/A   Occupational History  . Not on file.   Social History Main Topics  . Smoking status: Never Smoker   . Smokeless tobacco: Not on file  . Alcohol Use: Yes     Comment: occasional  . Drug Use: No  . Sexual Activity: Not on file   Other Topics Concern  . Not on file   Social History Narrative  . No narrative on file    ROS:  Positive for reflux. Otherwise as stated in the history of present illness and negative for all other systems.  PHYSICAL EXAM BP 116/75  Pulse 81  Ht 5\' 8"  (1.727 m)  Wt 202 lb (91.627 kg)  BMI 30.72 kg/m2 GENERAL:  Well appearing HEENT:  Pupils equal round and reactive, fundi not visualized, oral mucosa unremarkable NECK:  No jugular venous distention, waveform within normal limits, carotid upstroke brisk and symmetric, no bruits, no thyromegaly LYMPHATICS:  No cervical, inguinal adenopathy LUNGS:  Clear to auscultation bilaterally BACK:  No CVA tenderness CHEST:  Unremarkable HEART:  PMI not displaced or sustained,S1 and S2 within normal limits, no S3, no S4, no clicks, no rubs, no murmurs ABD:  Flat, positive bowel sounds normal  in frequency in pitch, no bruits, no rebound, no guarding, no midline pulsatile mass, no hepatomegaly, no splenomegaly EXT:  2 plus pulses throughout, no edema, no cyanosis no clubbing SKIN:  No rashes no nodules NEURO:  Cranial nerves II through XII grossly intact, motor grossly intact throughout [facility]:  Cognitively intact, oriented to person place and time   EKG:  Sinus rhythm, leftward axis, incomplete right bundle-branch block, no acute ST-T wave changes, rate 81  09/30/2013   ASSESSMENT AND PLAN  TACHYCARDIA: the patient has had no recurrence of this recently. He might be having a supraventricular tachycardia. We did discuss this and I reviewed the physiology as well as vagal maneuvers that he might employ. I would like him to try to get a rhythm strip or EKG when he is having one of these  episodes. However, due to the fact that they are infrequent I don't think that further testing is indicated at this point. I will defer lab work to the next time he sees Dr. Laurance Flatten if he still having any symptoms. He should have a TSH.

## 2013-09-30 NOTE — Progress Notes (Signed)
Future order put in for thyroid panel.

## 2013-11-16 ENCOUNTER — Other Ambulatory Visit: Payer: Self-pay | Admitting: Family Medicine

## 2013-11-26 ENCOUNTER — Encounter: Payer: Self-pay | Admitting: Family Medicine

## 2013-11-26 ENCOUNTER — Encounter (INDEPENDENT_AMBULATORY_CARE_PROVIDER_SITE_OTHER): Payer: Self-pay

## 2013-11-26 ENCOUNTER — Ambulatory Visit (INDEPENDENT_AMBULATORY_CARE_PROVIDER_SITE_OTHER): Payer: 59 | Admitting: Family Medicine

## 2013-11-26 VITALS — BP 115/78 | HR 75 | Temp 97.1°F | Ht 68.0 in | Wt 207.0 lb

## 2013-11-26 DIAGNOSIS — G4733 Obstructive sleep apnea (adult) (pediatric): Secondary | ICD-10-CM

## 2013-11-26 DIAGNOSIS — E785 Hyperlipidemia, unspecified: Secondary | ICD-10-CM

## 2013-11-26 DIAGNOSIS — I10 Essential (primary) hypertension: Secondary | ICD-10-CM

## 2013-11-26 DIAGNOSIS — E559 Vitamin D deficiency, unspecified: Secondary | ICD-10-CM

## 2013-11-26 DIAGNOSIS — E8881 Metabolic syndrome: Secondary | ICD-10-CM

## 2013-11-26 DIAGNOSIS — N400 Enlarged prostate without lower urinary tract symptoms: Secondary | ICD-10-CM

## 2013-11-26 LAB — POCT CBC
GRANULOCYTE PERCENT: 66.1 % (ref 37–80)
HEMATOCRIT: 44 % (ref 43.5–53.7)
HEMOGLOBIN: 14.4 g/dL (ref 14.1–18.1)
Lymph, poc: 1.8 (ref 0.6–3.4)
MCH, POC: 28.1 pg (ref 27–31.2)
MCHC: 32.7 g/dL (ref 31.8–35.4)
MCV: 85.9 fL (ref 80–97)
MPV: 7.2 fL (ref 0–99.8)
POC Granulocyte: 4 (ref 2–6.9)
POC LYMPH PERCENT: 29.6 %L (ref 10–50)
Platelet Count, POC: 229 10*3/uL (ref 142–424)
RBC: 5.1 M/uL (ref 4.69–6.13)
RDW, POC: 12.8 %
WBC: 6.1 10*3/uL (ref 4.6–10.2)

## 2013-11-26 MED ORDER — LISINOPRIL 10 MG PO TABS: 10.0000 mg | ORAL_TABLET | Freq: Every day | ORAL | Status: DC

## 2013-11-26 NOTE — Progress Notes (Signed)
Subjective:    Patient ID: GIBSON LAD, male    DOB: 07-17-1950, 63 y.o.   MRN: [id number]  HPI Pt here for follow up and management of chronic medical problems. The patient comes in for his 6 month followup for hypertension hyperlipidemia and metabolic syndrome. He has no specific complaints. He sees for lab work today and a chest x-ray. He will get his flu shot at work and will wait for Medicare or to start before he gets his Prevnar vaccine. The patient has been diagnosed with obstructive sleep apnea and he indicates that his treatment may have helped his fatigue issues during the day some. He is still wearing the equipment and getting used to it.       Patient Active Problem List   Diagnosis Date Noted  . Palpitation 09/30/2013  . Allergic rhinitis 05/27/2013  . Hypertension 05/27/2013  . Metabolic syndrome 26/83/4196  . BPH (benign prostatic hyperplasia) 11/25/2012  . Obstructive sleep apnea 11/25/2012  . Hyperlipemia 05/28/2012  . Vitamin D deficiency disease 05/28/2012   Outpatient Encounter Prescriptions as of 11/26/2013  Medication Sig  . azelastine (ASTELIN) 137 MCG/SPRAY nasal spray Place 2 sprays into both nostrils at bedtime. Use in each nostril as directed  . CRESTOR 10 MG tablet TAKE 1 TABLET EVERY DAY  . fluticasone (FLONASE) 50 MCG/ACT nasal spray Place 2 sprays into both nostrils daily as needed.  Marland Kitchen lisinopril (PRINIVIL,ZESTRIL) 10 MG tablet Take 1 tablet (10 mg total) by mouth daily.  . Vitamin D, Ergocalciferol, (DRISDOL) 50000 UNITS CAPS capsule TAKE ONE CAPSULE EVERY WEEK    Review of Systems  Constitutional: Negative.   HENT: Negative.   Eyes: Negative.   Respiratory: Negative.   Cardiovascular: Negative.   Gastrointestinal: Negative.   Endocrine: Negative.   Genitourinary: Negative.   Musculoskeletal: Negative.   Skin: Negative.   Allergic/Immunologic: Negative.   Neurological: Negative.   Hematological: Negative.   Psychiatric/Behavioral:  Negative.        Objective:   Physical Exam  Nursing note and vitals reviewed. Constitutional: He is oriented to person, place, and time. He appears well-developed and well-nourished.  HENT:  Head: Normocephalic and atraumatic.  Right Ear: External ear normal.  Left Ear: External ear normal.  Nose: Nose normal.  Mouth/Throat: Oropharynx is clear and moist. No oropharyngeal exudate.  Eyes: Conjunctivae and EOM are normal. Pupils are equal, round, and reactive to light. Right eye exhibits no discharge. Left eye exhibits no discharge. No scleral icterus.  Neck: Normal range of motion. Neck supple. No thyromegaly present.  No carotid bruit  Cardiovascular: Normal rate, regular rhythm, normal heart sounds and intact distal pulses.  Exam reveals no gallop and no friction rub.   No murmur heard. At 60 per minute  Pulmonary/Chest: Effort normal and breath sounds normal. No respiratory distress. He has no wheezes. He has no rales. He exhibits no tenderness.  Abdominal: Soft. Bowel sounds are normal. He exhibits no mass. There is no tenderness. There is no rebound and no guarding.  Musculoskeletal: Normal range of motion. He exhibits no edema and no tenderness.  Lymphadenopathy:    He has no cervical adenopathy.  Neurological: He is alert and oriented to person, place, and time. He has normal reflexes. No cranial nerve deficit.  Skin: Skin is warm and dry. No rash noted. No erythema. No pallor.  Psychiatric: He has a normal mood and affect. His behavior is normal. Judgment and thought content normal.   BP 115/78  Pulse 75  Temp(Src) 97.1 F (36.2 C) (Oral)  Ht _0  (1.727 m)  Wt 207 lb (93.895 kg)  BMI 31.48 kg/m2        Assessment & Plan:  1. Metabolic syndrome - POCT CBC  2. Vitamin D deficiency disease - POCT CBC - Vit D  25 hydroxy (rtn osteoporosis monitoring)  3. Essential hypertension - POCT CBC - BMP8+EGFR - Hepatic function panel  4. BPH (benign prostatic  hyperplasia) - POCT CBC  5. Hyperlipemia - POCT CBC - NMR, lipoprofile  6. Obstructive sleep apnea  Meds ordered this encounter  Medications  . lisinopril (PRINIVIL,ZESTRIL) 10 MG tablet    Sig: Take 1 tablet (10 mg total) by mouth daily.    Dispense:  90 tablet    Refill:  3    Patient Instructions                       Medicare Annual Wellness Visit  [facility] and the medical providers at [facility] strive to bring you the best medical care.  In doing so we not only want to address your current medical conditions and concerns but also to detect new conditions early and prevent illness, disease and health-related problems.    Medicare offers a yearly Wellness Visit which allows our clinical staff to assess your need for preventative services including immunizations, lifestyle education, counseling to decrease risk of preventable diseases and screening for fall risk and other medical concerns.    This visit is provided free of charge (no copay) for all Medicare recipients. The clinical pharmacists at [facility] have begun to conduct these Wellness Visits which will also include a thorough review of all your medications.    As you primary medical provider recommend that you make an appointment for your Annual Wellness Visit if you have not done so already this year.  You may set up this appointment before you leave today or you may call back ([phone]) and schedule an appointment.  Please make sure when you call that you mention that you are scheduling your Annual Wellness Visit with the clinical pharmacist so that the appointment may be made for the proper length of time.     Continue current medications. Continue good therapeutic lifestyle changes which include good diet and exercise. Fall precautions discussed with patient. If an FOBT was given today- please return it to our front desk. If you are over 32 years old - you may need  Prevnar 40 or the adult Pneumonia vaccine.  Flu Shots will be available at our office starting mid- September. Please call and schedule a FLU CLINIC APPOINTMENT.   The patient has plans to get his flu shot tomorrow He will return to the clinic for his Prevnar vaccine If he still remains lightheaded with bending over and standing up, especially in the summertime, he can reduce his lisinopril to one half by mouth daily. He should continue to drink plenty of fluids and avoid caffeine   Arrie Senate MD

## 2013-11-26 NOTE — Patient Instructions (Addendum)
Medicare Annual Wellness Visit  [facility] and the medical providers at [facility] strive to bring you the best medical care.  In doing so we not only want to address your current medical conditions and concerns but also to detect new conditions early and prevent illness, disease and health-related problems.    Medicare offers a yearly Wellness Visit which allows our clinical staff to assess your need for preventative services including immunizations, lifestyle education, counseling to decrease risk of preventable diseases and screening for fall risk and other medical concerns.    This visit is provided free of charge (no copay) for all Medicare recipients. The clinical pharmacists at [facility] have begun to conduct these Wellness Visits which will also include a thorough review of all your medications.    As you primary medical provider recommend that you make an appointment for your Annual Wellness Visit if you have not done so already this year.  You may set up this appointment before you leave today or you may call back [phone]) and schedule an appointment.  Please make sure when you call that you mention that you are scheduling your Annual Wellness Visit with the clinical pharmacist so that the appointment may be made for the proper length of time.     Continue current medications. Continue good therapeutic lifestyle changes which include good diet and exercise. Fall precautions discussed with patient. If an FOBT was given today- please return it to our front desk. If you are over 66 years old - you may need Prevnar 57 or the adult Pneumonia vaccine.  Flu Shots will be available at our office starting mid- September. Please call and schedule a FLU CLINIC APPOINTMENT.   The patient has plans to get his flu shot tomorrow He will return to the clinic for his Prevnar vaccine If he still remains lightheaded with bending  over and standing up, especially in the summertime, he can reduce his lisinopril to one half by mouth daily. He should continue to drink plenty of fluids and avoid caffeine

## 2013-11-27 ENCOUNTER — Telehealth: Payer: Self-pay | Admitting: Family Medicine

## 2013-11-27 LAB — HEPATIC FUNCTION PANEL
ALBUMIN: 4.5 g/dL (ref 3.6–4.8)
ALK PHOS: 73 IU/L (ref 39–117)
ALT: 23 IU/L (ref 0–44)
AST: 27 IU/L (ref 0–40)
Bilirubin, Direct: 0.19 mg/dL (ref 0.00–0.40)
Total Bilirubin: 0.6 mg/dL (ref 0.0–1.2)
Total Protein: 7.2 g/dL (ref 6.0–8.5)

## 2013-11-27 LAB — BMP8+EGFR
BUN / CREAT RATIO: 14 (ref 10–22)
BUN: 14 mg/dL (ref 8–27)
CO2: 24 mmol/L (ref 18–29)
CREATININE: 1 mg/dL (ref 0.76–1.27)
Calcium: 8.9 mg/dL (ref 8.6–10.2)
Chloride: 100 mmol/L (ref 97–108)
GFR, EST AFRICAN AMERICAN: 91 mL/min/{1.73_m2} (ref >59)
GFR, EST NON AFRICAN AMERICAN: 79 mL/min/{1.73_m2} (ref >59)
Glucose: 107 mg/dL — ABNORMAL HIGH (ref 65–99)
Potassium: 4.5 mmol/L (ref 3.5–5.2)
Sodium: 140 mmol/L (ref 134–144)

## 2013-11-27 LAB — VITAMIN D 25 HYDROXY (VIT D DEFICIENCY, FRACTURES): VIT D 25 HYDROXY: 35.6 ng/mL (ref 30.0–100.0)

## 2013-11-27 LAB — NMR, LIPOPROFILE
Cholesterol: 130 mg/dL (ref 100–199)
HDL Cholesterol by NMR: 54 mg/dL (ref >39)
HDL Particle Number: 37.4 umol/L (ref >=30.5)
LDL Particle Number: 709 nmol/L (ref <1000)
LDL Size: 20.9 nm (ref >20.5)
LDLC SERPL CALC-MCNC: 63 mg/dL (ref 0–99)
LP-IR Score: 42 (ref <=45)
SMALL LDL PARTICLE NUMBER: 231 nmol/L (ref <=527)
Triglycerides by NMR: 66 mg/dL (ref 0–149)

## 2013-11-27 NOTE — Telephone Encounter (Signed)
Message copied by Cline Crock on Fri Nov 27, 2013  3:17 PM ------      Message from: Chipper Herb      Created: Fri Nov 27, 2013  7:42 AM       The blood sugar slightly elevated at 107. The creatinine, the most important kidney function test is within normal limits. The electrolytes including potassium are within normal limit      All liver function tests are within normal limits      All cholesterol numbers with advanced lipid testing are excellent and at goal----continue with current treatment and aggressive therapeutic lifestyle changes which include diet and exercise      The vitamin D level is within normal limits at 35.6.------ the patient should continue with his current vitamin D dose, which appears to be 50,000 units once weekly ------

## 2013-12-02 ENCOUNTER — Other Ambulatory Visit: Payer: Self-pay | Admitting: Family Medicine

## 2013-12-07 ENCOUNTER — Encounter: Payer: Self-pay | Admitting: *Deleted

## 2013-12-07 NOTE — Telephone Encounter (Signed)
Patient aware.

## 2014-01-12 ENCOUNTER — Other Ambulatory Visit: Payer: Self-pay | Admitting: Family Medicine

## 2014-01-13 NOTE — Telephone Encounter (Signed)
Last seen 11/26/13 [facility]  Last Vit D 11/26/13 35.6 normal

## 2014-02-13 ENCOUNTER — Other Ambulatory Visit: Payer: Self-pay | Admitting: Family Medicine

## 2014-02-15 NOTE — Telephone Encounter (Signed)
Last seen 11/26/13 [facility]  Last Vit D 11/26/13 normal 35.6

## 2014-05-31 ENCOUNTER — Ambulatory Visit (INDEPENDENT_AMBULATORY_CARE_PROVIDER_SITE_OTHER): Payer: 59

## 2014-05-31 ENCOUNTER — Ambulatory Visit (INDEPENDENT_AMBULATORY_CARE_PROVIDER_SITE_OTHER): Payer: 59 | Admitting: Family Medicine

## 2014-05-31 ENCOUNTER — Encounter: Payer: Self-pay | Admitting: Family Medicine

## 2014-05-31 VITALS — BP 104/69 | HR 81 | Temp 97.2°F | Ht 68.0 in | Wt 213.0 lb

## 2014-05-31 DIAGNOSIS — E559 Vitamin D deficiency, unspecified: Secondary | ICD-10-CM | POA: Diagnosis not present

## 2014-05-31 DIAGNOSIS — E785 Hyperlipidemia, unspecified: Secondary | ICD-10-CM | POA: Diagnosis not present

## 2014-05-31 DIAGNOSIS — N400 Enlarged prostate without lower urinary tract symptoms: Secondary | ICD-10-CM | POA: Diagnosis not present

## 2014-05-31 DIAGNOSIS — E8881 Metabolic syndrome: Secondary | ICD-10-CM

## 2014-05-31 DIAGNOSIS — I10 Essential (primary) hypertension: Secondary | ICD-10-CM

## 2014-05-31 DIAGNOSIS — G4733 Obstructive sleep apnea (adult) (pediatric): Secondary | ICD-10-CM | POA: Diagnosis not present

## 2014-05-31 LAB — POCT UA - MICROSCOPIC ONLY
Bacteria, U Microscopic: NEGATIVE
CASTS, UR, LPF, POC: NEGATIVE
CRYSTALS, UR, HPF, POC: NEGATIVE
MUCUS UA: NEGATIVE
RBC, urine, microscopic: NEGATIVE
WBC, Ur, HPF, POC: NEGATIVE
YEAST UA: NEGATIVE

## 2014-05-31 LAB — POCT CBC
GRANULOCYTE PERCENT: 60.1 % (ref 37–80)
HCT, POC: 45.6 % (ref 43.5–53.7)
HEMOGLOBIN: 14.4 g/dL (ref 14.1–18.1)
LYMPH, POC: 2.1 (ref 0.6–3.4)
MCH, POC: 27.3 pg (ref 27–31.2)
MCHC: 31.7 g/dL — AB (ref 31.8–35.4)
MCV: 86.1 fL (ref 80–97)
MPV: 7.4 fL (ref 0–99.8)
POC Granulocyte: 3.7 (ref 2–6.9)
POC LYMPH %: 34.2 % (ref 10–50)
Platelet Count, POC: 247 10*3/uL (ref 142–424)
RBC: 5.29 M/uL (ref 4.69–6.13)
RDW, POC: 12.8 %
WBC: 6.2 10*3/uL (ref 4.6–10.2)

## 2014-05-31 LAB — POCT URINALYSIS DIPSTICK
Bilirubin, UA: NEGATIVE
Blood, UA: NEGATIVE
GLUCOSE UA: NEGATIVE
Ketones, UA: NEGATIVE
Leukocytes, UA: NEGATIVE
NITRITE UA: NEGATIVE
PROTEIN UA: NEGATIVE
SPEC GRAV UA: 1.015
Urobilinogen, UA: NEGATIVE
pH, UA: 6

## 2014-05-31 MED ORDER — FLUTICASONE PROPIONATE 50 MCG/ACT NA SUSP: 2.0000 | Freq: Every day | NASAL | Status: DC | PRN

## 2014-05-31 MED ORDER — AZELASTINE HCL 0.1 % NA SOLN: 2.0000 | Freq: Every day | NASAL | Status: DC

## 2014-05-31 MED ORDER — LISINOPRIL 10 MG PO TABS: 10.0000 mg | ORAL_TABLET | Freq: Every day | ORAL | Status: DC

## 2014-05-31 MED ORDER — VITAMIN D (ERGOCALCIFEROL) 1.25 MG (50000 UNIT) PO CAPS: ORAL_CAPSULE | ORAL | Status: DC

## 2014-05-31 MED ORDER — ROSUVASTATIN CALCIUM 10 MG PO TABS: 10.0000 mg | ORAL_TABLET | Freq: Every day | ORAL | Status: DC

## 2014-05-31 NOTE — Patient Instructions (Addendum)
Medicare Annual Wellness Visit  [facility] and the medical providers at [facility] strive to bring you the best medical care.  In doing so we not only want to address your current medical conditions and concerns but also to detect new conditions early and prevent illness, disease and health-related problems.    Medicare offers a yearly Wellness Visit which allows our clinical staff to assess your need for preventative services including immunizations, lifestyle education, counseling to decrease risk of preventable diseases and screening for fall risk and other medical concerns.    This visit is provided free of charge (no copay) for all Medicare recipients. The clinical pharmacists at [facility] have begun to conduct these Wellness Visits which will also include a thorough review of all your medications.    As you primary medical provider recommend that you make an appointment for your Annual Wellness Visit if you have not done so already this year.  You may set up this appointment before you leave today or you may call back ([phone]) and schedule an appointment.  Please make sure when you call that you mention that you are scheduling your Annual Wellness Visit with the clinical pharmacist so that the appointment may be made for the proper length of time.     Continue current medications. Continue good therapeutic lifestyle changes which include good diet and exercise. Fall precautions discussed with patient. If an FOBT was given today- please return it to our front desk. If you are over 34 years old - you may need Prevnar 39 or the adult Pneumonia vaccine.  Flu Shots are still available at our office. If you still haven't had one please call to set up a nurse visit to get one.   After your visit with [place] today you will receive a survey in the mail or online from [employer] regarding your care with [place]. Please take a moment to  fill this out. Your feedback is very important to [place] as you can help [place] better understand your patient needs as well as improve your experience and satisfaction. WE CARE ABOUT YOU!!!   Continue current medication You will receive the Prevnar vaccine today and it may make your arm sore You will need to get the Pneumovax the adult pneumonia vaccine in 1 year Return the FOBT Her next colonoscopy is not due until 2023 You will need yearly rectal exams

## 2014-05-31 NOTE — Progress Notes (Signed)
Subjective:    Patient ID: Daniel Macias, male    DOB: 01-10-1951, 64 y.o.   MRN: [id number]  HPI Pt here for follow up and management of chronic medical problems which includes hypertension and hyperlipidemia. He is taking medications regularly. The patient has been doing well with no specific complaints. He does have occasional indigestion when he eats certain spicy foods but this is not a regular thing. He denies chest pain shortness of breath trouble swallowing blood in the stool or black tarry bowel movements. He has no voiding issues or problems. He questioned that he might could use some medication for erectile dysfunction. He does have BPH history. But no symptoms related to this.        Patient Active Problem List   Diagnosis Date Noted  . Palpitation 09/30/2013  . Allergic rhinitis 05/27/2013  . Hypertension 05/27/2013  . Metabolic syndrome 02/77/4128  . BPH (benign prostatic hyperplasia) 11/25/2012  . Obstructive sleep apnea 11/25/2012  . Hyperlipemia 05/28/2012  . Vitamin D deficiency disease 05/28/2012   Outpatient Encounter Prescriptions as of 05/31/2014  Medication Sig  . azelastine (ASTELIN) 137 MCG/SPRAY nasal spray Place 2 sprays into both nostrils at bedtime. Use in each nostril as directed  . CRESTOR 10 MG tablet TAKE 1 TABLET EVERY DAY  . fluticasone (FLONASE) 50 MCG/ACT nasal spray Place 2 sprays into both nostrils daily as needed.  Marland Kitchen lisinopril (PRINIVIL,ZESTRIL) 10 MG tablet Take 1 tablet (10 mg total) by mouth daily.  . Vitamin D, Ergocalciferol, (DRISDOL) 50000 UNITS CAPS capsule TAKE 1 CAPSULE EVERY 7 DAYS (Patient not taking: Reported on 05/31/2014)  . [DISCONTINUED] Vitamin D, Ergocalciferol, (DRISDOL) 50000 UNITS CAPS capsule TAKE ONE CAPSULE EVERY WEEK    Review of Systems  Constitutional: Negative.   HENT: Negative.   Eyes: Negative.   Respiratory: Negative.   Cardiovascular: Negative.   Gastrointestinal: Negative.   Endocrine: Negative.     Genitourinary: Negative.   Musculoskeletal: Negative.   Skin: Negative.   Allergic/Immunologic: Negative.   Neurological: Negative.   Hematological: Negative.   Psychiatric/Behavioral: Negative.        Objective:   Physical Exam  Constitutional: He is oriented to person, place, and time. He appears well-developed and well-nourished. No distress.  The patient is alert and in good spirits with no specific complaints today.  HENT:  Head: Normocephalic and atraumatic.  Right Ear: External ear normal.  Left Ear: External ear normal.  Mouth/Throat: Oropharynx is clear and moist. No oropharyngeal exudate.  Minimal nasal congestion  Eyes: Conjunctivae and EOM are normal. Pupils are equal, round, and reactive to light. Right eye exhibits no discharge. Left eye exhibits no discharge. No scleral icterus.  Neck: Normal range of motion. Neck supple. No thyromegaly present.  No anterior cervical adenopathy or carotid bruits auscultated  Cardiovascular: Normal rate, regular rhythm, normal heart sounds and intact distal pulses.  Exam reveals no gallop and no friction rub.   No murmur heard. Rhythm is regular at 84/m  Pulmonary/Chest: Effort normal and breath sounds normal. No respiratory distress. He has no wheezes. He has no rales. He exhibits no tenderness.  No axillary adenopathy. Clear anteriorly and posteriorly  Abdominal: Soft. Bowel sounds are normal. He exhibits no mass. There is no tenderness. There is no rebound and no guarding.  The abdomen is mildly obese without masses tenderness or organ enlargement or bruits  Genitourinary: Rectum normal and penis normal.  There were no inguinal nodes. The prostate is slightly enlarged without lumps  or masses. There were no rectal masses. There are no inguinal hernias palpable. External genitalia were within normal limits.  Musculoskeletal: Normal range of motion. He exhibits no edema or tenderness.  Lymphadenopathy:    He has no cervical  adenopathy.  Neurological: He is alert and oriented to person, place, and time. He has normal reflexes. No cranial nerve deficit.  Skin: Skin is warm and dry. No rash noted. No erythema. No pallor.  Patient does have a warty-type skin lesion on his right posterior shoulder and upper chest and he will be scheduled for a time to come back for cryotherapy with this.  Psychiatric: He has a normal mood and affect. His behavior is normal. Judgment and thought content normal.  Nursing note and vitals reviewed.   BP 104/69 mmHg  Pulse 81  Temp(Src) 97.2 F (36.2 C) (Oral)  Ht 5' 8" (1.727 m)  Wt 213 lb (96.616 kg)  BMI 32.39 kg/m2  [facility] reading (PRIMARY) by  Dr. Brunilda Payor x-ray--  no active disease, degenerative changes in spine                                     Assessment & Plan:  1. Vitamin D deficiency disease -The patient has not had his vitamin D 50,000 units for a couple weeks and we will be rechecking this in the lab today and most likely he will need to continue with this dosage. It will be refilled. - POCT CBC - Vit D  25 hydroxy (rtn osteoporosis monitoring)  2. Essential hypertension -The blood pressure is good and we will continue with current treatment - POCT CBC - BMP8+EGFR - Hepatic function panel - DG Chest 2 View; Future  3. Metabolic syndrome -The patient continues with exercise and physical activity and continues to watch his diet as closely as possible. - POCT CBC - BMP8+EGFR  4. BPH (benign prostatic hyperplasia) -The patient is having no problems with this at the current time and we will follow-up with a PSA today and make sure this is stable. - POCT CBC - POCT UA - Microscopic Only - POCT urinalysis dipstick - PSA, total and free  5. Hyperlipemia -He will continue with his current treatment and aggressive therapeutic lifestyle changes pending results of lab work today. - POCT CBC - NMR, lipoprofile  6. Obstructive sleep apnea -He is doing better  and feeling more energy as result of using his CPAP. - POCT CBC  Meds ordered this encounter  Medications  . azelastine (ASTELIN) 0.1 % nasal spray    Sig: Place 2 sprays into both nostrils at bedtime. Use in each nostril as directed    Dispense:  30 mL    Refill:  6  . rosuvastatin (CRESTOR) 10 MG tablet    Sig: Take 1 tablet (10 mg total) by mouth daily.    Dispense:  90 tablet    Refill:  3  . fluticasone (FLONASE) 50 MCG/ACT nasal spray    Sig: Place 2 sprays into both nostrils daily as needed.    Dispense:  16 g    Refill:  6  . lisinopril (PRINIVIL,ZESTRIL) 10 MG tablet    Sig: Take 1 tablet (10 mg total) by mouth daily.    Dispense:  90 tablet    Refill:  3  . Vitamin D, Ergocalciferol, (DRISDOL) 50000 UNITS CAPS capsule    Sig: TAKE 1 CAPSULE EVERY 7 DAYS  Dispense:  12 capsule    Refill:  6   Patient Instructions                       Medicare Annual Wellness Visit  [facility] and the medical providers at [facility] strive to bring you the best medical care.  In doing so we not only want to address your current medical conditions and concerns but also to detect new conditions early and prevent illness, disease and health-related problems.    Medicare offers a yearly Wellness Visit which allows our clinical staff to assess your need for preventative services including immunizations, lifestyle education, counseling to decrease risk of preventable diseases and screening for fall risk and other medical concerns.    This visit is provided free of charge (no copay) for all Medicare recipients. The clinical pharmacists at [facility] have begun to conduct these Wellness Visits which will also include a thorough review of all your medications.    As you primary medical provider recommend that you make an appointment for your Annual Wellness Visit if you have not done so already this year.  You may set up this appointment before you  leave today or you may call back ([phone]) and schedule an appointment.  Please make sure when you call that you mention that you are scheduling your Annual Wellness Visit with the clinical pharmacist so that the appointment may be made for the proper length of time.     Continue current medications. Continue good therapeutic lifestyle changes which include good diet and exercise. Fall precautions discussed with patient. If an FOBT was given today- please return it to our front desk. If you are over 65 years old - you may need Prevnar 21 or the adult Pneumonia vaccine.  Flu Shots are still available at our office. If you still haven't had one please call to set up a nurse visit to get one.   After your visit with [place] today you will receive a survey in the mail or online from [employer] regarding your care with [place]. Please take a moment to fill this out. Your feedback is very important to [place] as you can help [place] better understand your patient needs as well as improve your experience and satisfaction. WE CARE ABOUT YOU!!!   Continue current medication You will receive the Prevnar vaccine today and it may make your arm sore You will need to get the Pneumovax the adult pneumonia vaccine in 1 year Return the FOBT Her next colonoscopy is not due until 2023 You will need yearly rectal exams   Patient was given samples of Cialis 5 to try to see if this will help his erectile dysfunction.  Arrie Senate MD

## 2014-06-01 ENCOUNTER — Ambulatory Visit (INDEPENDENT_AMBULATORY_CARE_PROVIDER_SITE_OTHER): Payer: 59 | Admitting: *Deleted

## 2014-06-01 DIAGNOSIS — Z23 Encounter for immunization: Secondary | ICD-10-CM

## 2014-06-01 LAB — BMP8+EGFR
BUN/Creatinine Ratio: 19 (ref 10–22)
BUN: 15 mg/dL (ref 8–27)
CO2: 24 mmol/L (ref 18–29)
Calcium: 9 mg/dL (ref 8.6–10.2)
Chloride: 100 mmol/L (ref 97–108)
Creatinine, Ser: 0.81 mg/dL (ref 0.76–1.27)
GFR, EST AFRICAN AMERICAN: 108 mL/min/{1.73_m2} (ref >59)
GFR, EST NON AFRICAN AMERICAN: 93 mL/min/{1.73_m2} (ref >59)
GLUCOSE: 102 mg/dL — AB (ref 65–99)
Potassium: 4.7 mmol/L (ref 3.5–5.2)
Sodium: 139 mmol/L (ref 134–144)

## 2014-06-01 LAB — HEPATIC FUNCTION PANEL
ALBUMIN: 4.3 g/dL (ref 3.6–4.8)
ALK PHOS: 79 IU/L (ref 39–117)
ALT: 24 IU/L (ref 0–44)
AST: 22 IU/L (ref 0–40)
BILIRUBIN TOTAL: 0.5 mg/dL (ref 0.0–1.2)
BILIRUBIN, DIRECT: 0.15 mg/dL (ref 0.00–0.40)
TOTAL PROTEIN: 7.1 g/dL (ref 6.0–8.5)

## 2014-06-01 LAB — NMR, LIPOPROFILE
CHOLESTEROL: 141 mg/dL (ref 100–199)
HDL CHOLESTEROL BY NMR: 58 mg/dL (ref >39)
HDL Particle Number: 38.1 umol/L (ref >=30.5)
LDL PARTICLE NUMBER: 681 nmol/L (ref <1000)
LDL Size: 21 nm (ref >20.5)
LDL-C: 69 mg/dL (ref 0–99)
LP-IR Score: 40 (ref <=45)
Small LDL Particle Number: 317 nmol/L (ref <=527)
TRIGLYCERIDES BY NMR: 71 mg/dL (ref 0–149)

## 2014-06-01 LAB — PSA, TOTAL AND FREE
PSA FREE: 0.26 ng/mL
PSA, Free Pct: 43.3 %
PSA: 0.6 ng/mL (ref 0.0–4.0)

## 2014-06-01 LAB — VITAMIN D 25 HYDROXY (VIT D DEFICIENCY, FRACTURES): VIT D 25 HYDROXY: 29 ng/mL — AB (ref 30.0–100.0)

## 2014-06-01 NOTE — Patient Instructions (Signed)
Pneumococcal Conjugate Vaccine: What You Need to Know  Your doctor recommends that you, or your child, get a dose of PCV13 today.  1. Why get vaccinated?  Pneumococcal conjugate vaccine (called PCV13 or Prevnar 13) is recommended to protect infants and toddlers, and some older children and adults with certain health conditions, from pneumococcal disease.  Pneumococcal disease is caused by infection with Streptococcus pneumoniae bacteria. These bacteria can spread from person to person through close contact.  Pneumococcal disease can lead to severe health problems, including pneumonia, blood infections, and meningitis.  Meningitis is an infection of the covering of the brain. Pneumococcal meningitis is fairly rare (less than 1 case per 100,000 people each year), but it leads to other health problems, including deafness and brain damage. In children, it is fatal in about 1 case out of 10.  Children younger than two are at higher risk for serious disease than older children.  People with certain medical conditions, people over age 65, and cigarette smokers are also at higher risk.  Before vaccine, pneumococcal infections caused many problems each year in the [place] in children younger than 5, including:  · more than 700 cases of meningitis,  · 13,000 blood infections,  · about 5 million ear infections, and  · about 200 deaths.  About 4,000 adults still die each year because of pneumococcal infections.  Pneumococcal infections can be hard to treat because some strains are resistant to antibiotics. This makes prevention through vaccination even more important.  2. PCV13 vaccine  There are more than 90 types of pneumococcal bacteria. PCV13 protects against 13 of them. These 13 strains cause most severe infections in children and about half of infections in adults.   PCV13 is routinely given to children at 2, 4, 6, and 12-15 months of age. Children in this age range are at greatest risk for serious diseases caused  by pneumococcal infection.  PCV13 vaccine may also be recommended for some older children or adults. Your doctor can give you details.  A second type of pneumococcal vaccine, called PPSV23, may also be given to some children and adults, including anyone over age 65. There is a separate Vaccine Information Statement for this vaccine.  3. Precautions   Anyone who has ever had a life-threatening allergic reaction to a dose of this vaccine, to an earlier pneumococcal vaccine called PCV7 (or Prevnar), or to any vaccine containing diphtheria toxoid (for example, DTaP), should not get PCV13.  Anyone with a severe allergy to any component of PCV13 should not get the vaccine. Tell your doctor if the person being vaccinated has any severe allergies.  If the person scheduled for vaccination is sick, your doctor might decide to reschedule the shot on another day.  Your doctor can give you more information about any of these precautions.  4. What are the risks of PCV13 vaccine?   With any medicine, including vaccines, there is a chance of side effects. These are usually mild and go away on their own, but serious reactions are also possible.  Reported problems associated with PCV13 vary by dose and age, but generally:  · About half of children became drowsy after the shot, had a temporary loss of appetite, or had redness or tenderness where the shot was given.  · About 1 out of 3 had swelling where the shot was given.  · About 1 out of 3 had a mild fever, and about 1 in 20 had a higher fever (over 102.2°F).  ·   Up to about 8 out of 10 became fussy or irritable.  Adults receiving the vaccine have reported redness, pain, and swelling where the shot was given. Mild fever, fatigue, headache, chills, or muscle pain have also been reported.  Life-threatening allergic reactions from any vaccine are very rare.  5. What if there is a serious reaction?  What should I look for?  · Look for anything that concerns you, such as signs of a  severe allergic reaction, very high fever, or behavior changes.  Signs of a severe allergic reaction can include hives, swelling of the face and throat, difficulty breathing, a fast heartbeat, dizziness, and weakness. These would start a few minutes to a few hours after the vaccination.  What should I do?  · If you think it is a severe allergic reaction or other emergency that can't wait, call 9-1-1 or get the person to the nearest hospital. Otherwise, call your doctor.  · Afterward, the reaction should be reported to the Vaccine Adverse Event Reporting System (VAERS). Your doctor might file this report, or you can do it yourself through the VAERS web site at www.vaers.hhs.gov, or by calling 1-800-822-7967.  VAERS is only for reporting reactions. They do not give medical advice.  6. The National Vaccine Injury Compensation Program  The National Vaccine Injury Compensation Program (VICP) is a federal program that was created to compensate people who may have been injured by certain vaccines.  Persons who believe they may have been injured by a vaccine can learn about the program and about filing a claim by calling 1-800-338-2382 or visiting the VICP website at www.hrsa.gov/vaccinecompensation.  7. How can I learn more?  · Ask your doctor.  · Call your local or state health department.  · Contact the Centers for Disease Control and Prevention (CDC):  ¨ Call 1-800-232-4636 (1-800-CDC-INFO) or  ¨ Visit CDC's website at www.cdc.gov/vaccines  CDC PCV13 Vaccine VIS (Interim) (04/11/11)  Document Released: 11/26/2005 Document Revised: 06/15/2013 Document Reviewed: 03/20/2013  ExitCare® Patient Information ©2015 [employer]. This information is not intended to replace advice given to you by your health care provider. Make sure you discuss any questions you have with your health care provider.

## 2014-06-02 ENCOUNTER — Telehealth: Payer: Self-pay | Admitting: *Deleted

## 2014-06-02 NOTE — Telephone Encounter (Signed)
lmtcb regarding test results. 

## 2014-06-02 NOTE — Telephone Encounter (Signed)
-----   Message from Chipper Herb, MD sent at 06/01/2014  7:33 AM EDT ----- The blood sugar slightly elevated at 102. The creatinine, the most important kidney function test is within normal limits. The electrolytes including potassium are within normal limits. All liver function tests are within normal limits. All cholesterol numbers with advanced lipid testing are excellent and at goal with a total LDL particle number of 681. The LDL C is good at 69. The triglycerides are good at 71. The patient should continue with his Crestor and with his aggressive therapeutic lifestyle changes which include diet and exercise The PSA remains low and within normal limits. The vitamin D level is low and the patient indicated he had not been taking the vitamin D for the past couple of weeks. He should resume his vitamin D 50,000 units weekly and this prescription has already been refill. He should not stop taking it in the future.

## 2014-06-04 NOTE — Telephone Encounter (Signed)
Patient aware.

## 2014-06-24 ENCOUNTER — Other Ambulatory Visit: Payer: Medicare Other

## 2014-06-24 DIAGNOSIS — Z1212 Encounter for screening for malignant neoplasm of rectum: Secondary | ICD-10-CM

## 2014-06-24 NOTE — Progress Notes (Signed)
Lab only 

## 2014-06-26 LAB — FECAL OCCULT BLOOD, IMMUNOCHEMICAL: Fecal Occult Bld: NEGATIVE

## 2014-10-13 ENCOUNTER — Other Ambulatory Visit: Payer: Self-pay | Admitting: Family Medicine

## 2014-12-01 ENCOUNTER — Encounter: Payer: Self-pay | Admitting: Family Medicine

## 2014-12-01 ENCOUNTER — Ambulatory Visit (INDEPENDENT_AMBULATORY_CARE_PROVIDER_SITE_OTHER): Payer: 59 | Admitting: Family Medicine

## 2014-12-01 ENCOUNTER — Ambulatory Visit (INDEPENDENT_AMBULATORY_CARE_PROVIDER_SITE_OTHER): Payer: 59

## 2014-12-01 VITALS — BP 106/71 | HR 77 | Temp 97.9°F | Ht 68.0 in | Wt 206.0 lb

## 2014-12-01 DIAGNOSIS — E8881 Metabolic syndrome: Secondary | ICD-10-CM | POA: Diagnosis not present

## 2014-12-01 DIAGNOSIS — J301 Allergic rhinitis due to pollen: Secondary | ICD-10-CM

## 2014-12-01 DIAGNOSIS — N400 Enlarged prostate without lower urinary tract symptoms: Secondary | ICD-10-CM

## 2014-12-01 DIAGNOSIS — M25572 Pain in left ankle and joints of left foot: Secondary | ICD-10-CM

## 2014-12-01 DIAGNOSIS — I10 Essential (primary) hypertension: Secondary | ICD-10-CM | POA: Diagnosis not present

## 2014-12-01 DIAGNOSIS — E785 Hyperlipidemia, unspecified: Secondary | ICD-10-CM

## 2014-12-01 DIAGNOSIS — E559 Vitamin D deficiency, unspecified: Secondary | ICD-10-CM | POA: Diagnosis not present

## 2014-12-01 DIAGNOSIS — Z23 Encounter for immunization: Secondary | ICD-10-CM

## 2014-12-01 MED ORDER — MELOXICAM 15 MG PO TABS: 15.0000 mg | ORAL_TABLET | Freq: Every day | ORAL | Status: DC

## 2014-12-01 NOTE — Progress Notes (Signed)
Subjective:    Patient ID: Daniel Macias, male    DOB: November 26, 1950, 64 y.o.   MRN: [id number]  HPI Pt here for follow up and management of chronic medical problems which includes hypertension and hyperlipidemia. He is taking medications regularly. The patient does today complain of some left ankle pain. He had a history of surgery on this in 1971. His ankle was injured when he was thrown off of a horse at that time. Now it throbs and it hurts with walking on it and it's mostly in the top of the foot between the medial and lateral malleoli. The patient wants to get his flu shot today. The patient denies chest pain shortness of breath heartburn trouble swallowing nausea vomiting diarrhea or blood in the stool. He is passing his water without problems.       Patient Active Problem List   Diagnosis Date Noted  . Palpitation 09/30/2013  . Allergic rhinitis 05/27/2013  . Hypertension 05/27/2013  . Metabolic syndrome 08/12/1599  . BPH (benign prostatic hyperplasia) 11/25/2012  . Obstructive sleep apnea 11/25/2012  . Hyperlipemia 05/28/2012  . Vitamin D deficiency disease 05/28/2012   Outpatient Encounter Prescriptions as of 12/01/2014  Medication Sig  . azelastine (ASTELIN) 0.1 % nasal spray Place 2 sprays into both nostrils at bedtime. Use in each nostril as directed  . fluticasone (FLONASE) 50 MCG/ACT nasal spray Place 2 sprays into both nostrils daily as needed.  Marland Kitchen lisinopril (PRINIVIL,ZESTRIL) 10 MG tablet TAKE 1 TABLET (10 MG TOTAL) BY MOUTH DAILY.  . rosuvastatin (CRESTOR) 10 MG tablet Take 1 tablet (10 mg total) by mouth daily.  . Vitamin D, Ergocalciferol, (DRISDOL) 50000 UNITS CAPS capsule TAKE 1 CAPSULE EVERY 7 DAYS   No facility-administered encounter medications on file as of 12/01/2014.      Review of Systems  Constitutional: Negative.   HENT: Negative.   Eyes: Negative.   Respiratory: Negative.   Cardiovascular: Negative.   Gastrointestinal: Negative.   Endocrine:  Negative.   Genitourinary: Negative.   Musculoskeletal: Positive for arthralgias (left ankle pain - hx of surgery).  Skin: Negative.   Allergic/Immunologic: Negative.   Neurological: Negative.   Hematological: Negative.   Psychiatric/Behavioral: Negative.        Objective:   Physical Exam  Constitutional: He is oriented to person, place, and time. He appears well-developed and well-nourished. No distress.  HENT:  Head: Normocephalic and atraumatic.  Right Ear: External ear normal.  Left Ear: External ear normal.  Mouth/Throat: Oropharynx is clear and moist. No oropharyngeal exudate.  There is some nasal congestion bilaterally  Eyes: Conjunctivae and EOM are normal. Pupils are equal, round, and reactive to light. Right eye exhibits no discharge. Left eye exhibits no discharge. No scleral icterus.  Neck: Normal range of motion. Neck supple. No thyromegaly present.  Without bruits thyromegaly or adenopathy  Cardiovascular: Normal rate, regular rhythm, normal heart sounds and intact distal pulses.  Exam reveals no friction rub.   No murmur heard. The rhythm is regular at 84/m  Pulmonary/Chest: Effort normal and breath sounds normal. No respiratory distress. He has no wheezes. He has no rales. He exhibits no tenderness.  There is no axillary adenopathy there is no chest wall masses and the lungs are clear anteriorly and posteriorly  Abdominal: Soft. Bowel sounds are normal. He exhibits no mass. There is no tenderness. There is no rebound and no guarding.  The abdomen is nontender without organ enlargement masses or bruits and there are no inguinal nodes.  Musculoskeletal: Normal range of motion. He exhibits tenderness. He exhibits no edema.  There is definite tenderness in the dorsal foot between the medial and lateral malleoli. Is no rubor or erythema.  Lymphadenopathy:    He has no cervical adenopathy.  Neurological: He is alert and oriented to person, place, and time. He has normal  reflexes. No cranial nerve deficit.  Skin: Skin is warm and dry. No rash noted. No erythema.  Psychiatric: He has a normal mood and affect. His behavior is normal. Judgment and thought content normal.  Nursing note and vitals reviewed.  BP 106/71 mmHg  Pulse 77  Temp(Src) 97.9 F (36.6 C) (Oral)  Ht '5\' 8"'  (1.727 m)  Wt 206 lb (93.441 kg)  BMI 31.33 kg/m2  [facility] reading (PRIMARY) by  Dr.Davian Macias-left ankle--old fracture and degenerative changes in the tarsal joints                                        Assessment & Plan:  1. Vitamin D deficiency disease -Continue current treatment pending results of lab work - CBC with Differential/Platelet - Vit D  25 hydroxy (rtn osteoporosis monitoring)  2. Essential hypertension -The blood pressure is excellent today and his weight loss has a good deal of the dens and he needs to continue to work on this as well as watching his sodium intake. He should continue with his current treatment. - CBC with Differential/Platelet - BMP8+EGFR - Hepatic function panel  3. BPH (benign prostatic hyperplasia) -Is having no symptoms with passing his water - CBC with Differential/Platelet  4. Hyperlipemia -Continue current treatment pending results of lab work - CBC with Differential/Platelet - NMR, lipoprofile  5. Metabolic syndrome -Continue with weight loss and diet and drinking plenty of water - CBC with Differential/Platelet - BMP8+EGFR  6. Allergic rhinitis due to pollen -Continue to use Flonase and Astelin as needed specifically during times of greatest allergen exposure  7. Left ankle pain -X-ray today and take anti-inflammatory medicines and if no improvement in a couple weeks will refer to orthopedic specialist for further evaluation and possible steroid injection - meloxicam (MOBIC) 15 MG tablet; Take 1 tablet (15 mg total) by mouth daily.  Dispense: 30 tablet; Refill: 0 - DG Ankle Complete Left; Future  Meds ordered this encounter    Medications  . meloxicam (MOBIC) 15 MG tablet    Sig: Take 1 tablet (15 mg total) by mouth daily.    Dispense:  30 tablet    Refill:  0   Patient Instructions                       Medicare Annual Wellness Visit  Daniel Macias and the medical providers at [facility] strive to bring you the best medical care.  In doing so we not only want to address your current medical conditions and concerns but also to detect new conditions early and prevent illness, disease and health-related problems.    Medicare offers a yearly Wellness Visit which allows our clinical staff to assess your need for preventative services including immunizations, lifestyle education, counseling to decrease risk of preventable diseases and screening for fall risk and other medical concerns.    This visit is provided free of charge (no copay) for all Medicare recipients. The clinical pharmacists at [facility] have begun to conduct these Wellness Visits which  will also include a thorough review of all your medications.    As you primary medical provider recommend that you make an appointment for your Annual Wellness Visit if you have not done so already this year.  You may set up this appointment before you leave today or you may call back ([phone]) and schedule an appointment.  Please make sure when you call that you mention that you are scheduling your Annual Wellness Visit with the clinical pharmacist so that the appointment may be made for the proper length of time.     Continue current medications. Continue good therapeutic lifestyle changes which include good diet and exercise. Fall precautions discussed with patient. If an FOBT was given today- please return it to our front desk. If you are over 15 years old - you may need Prevnar 12 or the adult Pneumonia vaccine.  **Flu shots are available--- please call and schedule a FLU-CLINIC appointment**  After your visit with  [place] today you will receive a survey in the mail or online from [employer] regarding your care with [place]. Please take a moment to fill this out. Your feedback is very important to [place] as you can help [place] better understand your patient needs as well as improve your experience and satisfaction. WE CARE ABOUT YOU!!!   Take the anti-inflammatory medicine as directed. Take a whole one for the first 4 or 5 days and then reduce it to one half a one daily and make sure you take this after eating and if it bothers her stomach discontinue it. Call us in a couple weeks if you are no better and we will make arrangements for you to see Dr. Doran Durand with [facility] orthopedics This winter drink plenty of fluids and stay well hydrated Continue to work on the diet and keep the weight down as much as possible Continue to watch sodium intake   Arrie Senate MD

## 2014-12-01 NOTE — Patient Instructions (Addendum)
Medicare Annual Wellness Visit  Daniel Macias and the medical providers at [facility] strive to bring you the best medical care.  In doing so we not only want to address your current medical conditions and concerns but also to detect new conditions early and prevent illness, disease and health-related problems.    Medicare offers a yearly Wellness Visit which allows our clinical staff to assess your need for preventative services including immunizations, lifestyle education, counseling to decrease risk of preventable diseases and screening for fall risk and other medical concerns.    This visit is provided free of charge (no copay) for all Medicare recipients. The clinical pharmacists at [facility] have begun to conduct these Wellness Visits which will also include a thorough review of all your medications.    As you primary medical provider recommend that you make an appointment for your Annual Wellness Visit if you have not done so already this year.  You may set up this appointment before you leave today or you may call back ([phone]) and schedule an appointment.  Please make sure when you call that you mention that you are scheduling your Annual Wellness Visit with the clinical pharmacist so that the appointment may be made for the proper length of time.     Continue current medications. Continue good therapeutic lifestyle changes which include good diet and exercise. Fall precautions discussed with patient. If an FOBT was given today- please return it to our front desk. If you are over 30 years old - you may need Prevnar 55 or the adult Pneumonia vaccine.  **Flu shots are available--- please call and schedule a FLU-CLINIC appointment**  After your visit with [place] today you will receive a survey in the mail or online from [employer] regarding your care with [place]. Please take a moment to fill this out. Your feedback is very  important to [place] as you can help [place] better understand your patient needs as well as improve your experience and satisfaction. WE CARE ABOUT YOU!!!   Take the anti-inflammatory medicine as directed. Take a whole one for the first 4 or 5 days and then reduce it to one half a one daily and make sure you take this after eating and if it bothers her stomach discontinue it. Call us in a couple weeks if you are no better and we will make arrangements for you to see Dr. Doran Durand with [facility] orthopedics This winter drink plenty of fluids and stay well hydrated Continue to work on the diet and keep the weight down as much as possible Continue to watch sodium intake

## 2014-12-02 LAB — HEPATIC FUNCTION PANEL
ALK PHOS: 75 IU/L (ref 39–117)
ALT: 20 IU/L (ref 0–44)
AST: 16 IU/L (ref 0–40)
Albumin: 4.5 g/dL (ref 3.6–4.8)
BILIRUBIN, DIRECT: 0.19 mg/dL (ref 0.00–0.40)
Bilirubin Total: 0.6 mg/dL (ref 0.0–1.2)
Total Protein: 7.2 g/dL (ref 6.0–8.5)

## 2014-12-02 LAB — CBC WITH DIFFERENTIAL/PLATELET
BASOS: 0 %
Basophils Absolute: 0 10*3/uL (ref 0.0–0.2)
EOS (ABSOLUTE): 0.1 10*3/uL (ref 0.0–0.4)
EOS: 1 %
HEMATOCRIT: 44 % (ref 37.5–51.0)
HEMOGLOBIN: 15.1 g/dL (ref 12.6–17.7)
IMMATURE GRANS (ABS): 0 10*3/uL (ref 0.0–0.1)
Immature Granulocytes: 0 %
LYMPHS: 32 %
Lymphocytes Absolute: 2 10*3/uL (ref 0.7–3.1)
MCH: 30.4 pg (ref 26.6–33.0)
MCHC: 34.3 g/dL (ref 31.5–35.7)
MCV: 89 fL (ref 79–97)
MONOCYTES: 8 %
Monocytes Absolute: 0.5 10*3/uL (ref 0.1–0.9)
NEUTROS ABS: 3.8 10*3/uL (ref 1.4–7.0)
Neutrophils: 59 %
Platelets: 253 10*3/uL (ref 150–379)
RBC: 4.97 x10E6/uL (ref 4.14–5.80)
RDW: 13.7 % (ref 12.3–15.4)
WBC: 6.5 10*3/uL (ref 3.4–10.8)

## 2014-12-02 LAB — NMR, LIPOPROFILE
Cholesterol: 142 mg/dL (ref 100–199)
HDL Cholesterol by NMR: 56 mg/dL (ref >39)
HDL Particle Number: 38.7 umol/L (ref >=30.5)
LDL PARTICLE NUMBER: 803 nmol/L (ref <1000)
LDL Size: 20.9 nm (ref >20.5)
LDL-C: 73 mg/dL (ref 0–99)
LP-IR SCORE: 50 — AB (ref <=45)
Small LDL Particle Number: 392 nmol/L (ref <=527)
Triglycerides by NMR: 64 mg/dL (ref 0–149)

## 2014-12-02 LAB — BMP8+EGFR
BUN / CREAT RATIO: 22 (ref 10–22)
BUN: 16 mg/dL (ref 8–27)
CO2: 25 mmol/L (ref 18–29)
CREATININE: 0.72 mg/dL — AB (ref 0.76–1.27)
Calcium: 9.2 mg/dL (ref 8.6–10.2)
Chloride: 99 mmol/L (ref 97–106)
GFR, EST AFRICAN AMERICAN: 112 mL/min/{1.73_m2} (ref >59)
GFR, EST NON AFRICAN AMERICAN: 97 mL/min/{1.73_m2} (ref >59)
Glucose: 112 mg/dL — ABNORMAL HIGH (ref 65–99)
Potassium: 4.6 mmol/L (ref 3.5–5.2)
Sodium: 139 mmol/L (ref 136–144)

## 2014-12-02 LAB — VITAMIN D 25 HYDROXY (VIT D DEFICIENCY, FRACTURES): Vit D, 25-Hydroxy: 40.5 ng/mL (ref 30.0–100.0)

## 2014-12-27 ENCOUNTER — Other Ambulatory Visit: Payer: Self-pay | Admitting: Family Medicine

## 2015-01-10 ENCOUNTER — Encounter (INDEPENDENT_AMBULATORY_CARE_PROVIDER_SITE_OTHER): Payer: Self-pay

## 2015-01-10 ENCOUNTER — Ambulatory Visit (INDEPENDENT_AMBULATORY_CARE_PROVIDER_SITE_OTHER): Payer: 59 | Admitting: Family Medicine

## 2015-01-10 ENCOUNTER — Encounter: Payer: Self-pay | Admitting: Family Medicine

## 2015-01-10 VITALS — BP 126/76 | HR 79 | Temp 97.4°F | Ht 68.0 in | Wt 208.0 lb

## 2015-01-10 DIAGNOSIS — R05 Cough: Secondary | ICD-10-CM

## 2015-01-10 DIAGNOSIS — R059 Cough, unspecified: Secondary | ICD-10-CM

## 2015-01-10 DIAGNOSIS — J40 Bronchitis, not specified as acute or chronic: Secondary | ICD-10-CM

## 2015-01-10 DIAGNOSIS — J209 Acute bronchitis, unspecified: Secondary | ICD-10-CM

## 2015-01-10 MED ORDER — PREDNISONE 10 MG PO TABS: ORAL_TABLET | ORAL | Status: DC

## 2015-01-10 MED ORDER — ALBUTEROL SULFATE HFA 108 (90 BASE) MCG/ACT IN AERS: 1.0000 | INHALATION_SPRAY | Freq: Four times a day (QID) | RESPIRATORY_TRACT | Status: DC | PRN

## 2015-01-10 MED ORDER — AZITHROMYCIN 250 MG PO TABS: ORAL_TABLET | ORAL | Status: DC

## 2015-01-10 MED ORDER — METHYLPREDNISOLONE ACETATE 80 MG/ML IJ SUSP
60.0000 mg | Freq: Once | INTRAMUSCULAR | Status: AC
  Administered 2015-01-10: 60 mg via INTRAMUSCULAR

## 2015-01-10 NOTE — Progress Notes (Signed)
Subjective:    Patient ID: Daniel Macias, male    DOB: November 11, 1950, 64 y.o.   MRN: [id number]  HPI Patient here today for cough and congestion that started about 2 weeks ago. The patient has had cough and thick green sputum. He is also has some wheezing. The patient has recently been to [place] to a floor show and all the way back developed this cough and congestion this was about 2 weeks ago. He has trouble talking without coughing. He is not been running a lot of fever.      Patient Active Problem List   Diagnosis Date Noted  . Palpitation 09/30/2013  . Allergic rhinitis 05/27/2013  . Hypertension 05/27/2013  . Metabolic syndrome [id number]  . BPH (benign prostatic hyperplasia) 11/25/2012  . Obstructive sleep apnea 11/25/2012  . Hyperlipemia 05/28/2012  . Vitamin D deficiency disease 05/28/2012   Outpatient Encounter Prescriptions as of 01/10/2015  Medication Sig  . azelastine (ASTELIN) 0.1 % nasal spray Place 2 sprays into both nostrils at bedtime. Use in each nostril as directed  . fluticasone (FLONASE) 50 MCG/ACT nasal spray Place 2 sprays into both nostrils daily as needed.  Marland Kitchen lisinopril (PRINIVIL,ZESTRIL) 10 MG tablet TAKE 1 TABLET (10 MG TOTAL) BY MOUTH DAILY.  . meloxicam (MOBIC) 15 MG tablet TAKE 1 TABLET (15 MG TOTAL) BY MOUTH DAILY.  . rosuvastatin (CRESTOR) 10 MG tablet Take 1 tablet (10 mg total) by mouth daily.  . Vitamin D, Ergocalciferol, (DRISDOL) 50000 UNITS CAPS capsule TAKE 1 CAPSULE EVERY 7 DAYS   No facility-administered encounter medications on file as of 01/10/2015.      Review of Systems  Constitutional: Negative.  Negative for fever.  HENT: Positive for congestion (green and thick).   Eyes: Negative.   Respiratory: Positive for cough and wheezing.   Cardiovascular: Negative.   Gastrointestinal: Negative.   Endocrine: Negative.   Genitourinary: Negative.   Musculoskeletal: Negative.   Skin: Negative.   Allergic/Immunologic: Negative.     Neurological: Negative.   Hematological: Negative.   Psychiatric/Behavioral: Negative.        Objective:   Physical Exam  Constitutional: He is oriented to person, place, and time. He appears well-developed and well-nourished. No distress.  HENT:  Head: Normocephalic and atraumatic.  Right Ear: External ear normal.  Left Ear: External ear normal.  Mouth/Throat: No oropharyngeal exudate.  Some nasal congestion bilaterally and slight swelling in the posterior throat  Eyes: Conjunctivae and EOM are normal. Pupils are equal, round, and reactive to light. Right eye exhibits no discharge. Left eye exhibits no discharge. No scleral icterus.  Neck: Normal range of motion. Neck supple. No thyromegaly present.  No adenopathy  Cardiovascular: Normal rate, regular rhythm, normal heart sounds and intact distal pulses.  Exam reveals no gallop and no friction rub.   No murmur heard. Pulmonary/Chest: Effort normal. No respiratory distress. He has no wheezes. He has no rales. He exhibits no tenderness.  With deep breathing patient has a cough that is dry. There are no rales or rhonchi.  Lymphadenopathy:    He has no cervical adenopathy.  Neurological: He is alert and oriented to person, place, and time.  Skin: Skin is warm and dry. No rash noted.  Psychiatric: He has a normal mood and affect. His behavior is normal. Judgment and thought content normal.  Nursing note and vitals reviewed.   BP 126/76 mmHg  Pulse 79  Temp(Src) 97.4 F (36.3 C) (Oral)  Ht 5\' 8"  (1.727 m)  Wt  208 lb (94.348 kg)  BMI 31.63 kg/m2       Assessment & Plan:  1. Cough -Take Mucinex, blue and white in color as directed twice daily with a large glass of water - CBC with Differential/Platelet - methylPREDNISolone acetate (DEPO-MEDROL) injection 60 mg; Inject 0.75 mLs (60 mg total) into the muscle once.  2. Bronchitis with bronchospasm -Take antibiotic and prednisone as directed. The patient will be given a Z-Pak  and he will also take a tapering course of prednisone. -Use a cool mist humidifier if possible in the home at nighttime  Meds ordered this encounter  Medications  . methylPREDNISolone acetate (DEPO-MEDROL) injection 60 mg    Sig:   . predniSONE (DELTASONE) 10 MG tablet    Sig: Take 1 tab PO QID x 2 days,then take 1 tab PO TID x 2 days, then take 1 tab PO BID x 2 days, then 1 tab PO QD x 2 days, then stop.    Dispense:  20 tablet    Refill:  0   Patient Instructions  Drink plenty of fluids and stay well hydrated Take Mucinex, blue and white in color, 1 twice daily with a large glass of water for cough and congestion. Take this regularly for several weeks Keep the house as cool as possible Use nasal saline frequently each nostril through the day Take antibiotic and prednisone as directed   Arrie Senate MD

## 2015-01-10 NOTE — Patient Instructions (Addendum)
Drink plenty of fluids and stay well hydrated Take Mucinex, blue and white in color, 1 twice daily with a large glass of water for cough and congestion. Take this regularly for several weeks Keep the house as cool as possible Use nasal saline frequently each nostril through the day Take antibiotic and prednisone as directed We will call the patient with the results of the CBC is sent as those results become available If the cough gets worse or he starts running high fever he should come back to the office for recheck and get a chest x-ray.

## 2015-01-11 LAB — CBC WITH DIFFERENTIAL/PLATELET
BASOS: 1 %
Basophils Absolute: 0 10*3/uL (ref 0.0–0.2)
EOS (ABSOLUTE): 0.1 10*3/uL (ref 0.0–0.4)
EOS: 2 %
HEMATOCRIT: 42.5 % (ref 37.5–51.0)
Hemoglobin: 14.6 g/dL (ref 12.6–17.7)
IMMATURE GRANULOCYTES: 0 %
Immature Grans (Abs): 0 10*3/uL (ref 0.0–0.1)
LYMPHS ABS: 1.5 10*3/uL (ref 0.7–3.1)
Lymphs: 27 %
MCH: 30.3 pg (ref 26.6–33.0)
MCHC: 34.4 g/dL (ref 31.5–35.7)
MCV: 88 fL (ref 79–97)
MONOS ABS: 0.5 10*3/uL (ref 0.1–0.9)
Monocytes: 9 %
NEUTROS ABS: 3.5 10*3/uL (ref 1.4–7.0)
Neutrophils: 61 %
Platelets: 273 10*3/uL (ref 150–379)
RBC: 4.82 x10E6/uL (ref 4.14–5.80)
RDW: 13.7 % (ref 12.3–15.4)
WBC: 5.7 10*3/uL (ref 3.4–10.8)

## 2015-01-14 ENCOUNTER — Other Ambulatory Visit: Payer: Self-pay | Admitting: Family Medicine

## 2015-02-03 ENCOUNTER — Encounter: Payer: Self-pay | Admitting: Family Medicine

## 2015-02-03 ENCOUNTER — Ambulatory Visit (INDEPENDENT_AMBULATORY_CARE_PROVIDER_SITE_OTHER): Payer: 59 | Admitting: Family Medicine

## 2015-02-03 VITALS — BP 122/71 | HR 85 | Temp 98.0°F | Ht 68.0 in | Wt 210.0 lb

## 2015-02-03 DIAGNOSIS — J029 Acute pharyngitis, unspecified: Secondary | ICD-10-CM | POA: Diagnosis not present

## 2015-02-03 LAB — POCT RAPID STREP A (OFFICE): Rapid Strep A Screen: POSITIVE — AB

## 2015-02-03 MED ORDER — AMOXICILLIN 875 MG PO TABS: 875.0000 mg | ORAL_TABLET | Freq: Two times a day (BID) | ORAL | Status: DC

## 2015-02-03 NOTE — Progress Notes (Signed)
   Subjective:    Patient ID: Daniel Macias, male    DOB: 04/04/1950, 63 y.o.   MRN: [id number]  HPI 64 year old gentleman with sore throat. He's had some nasal drainage and discharge. About a month ago he was treated for a bronchial infection. He woke up this morning with a bad sore throat. He denies fever or headache. He has never had strep throat and we discussed the chances of this being strep throat are small given that history.    Review of Systems  Constitutional: Negative.   HENT: Positive for congestion and voice change.   Respiratory: Positive for cough.   Cardiovascular: Negative.       BP 122/71 mmHg  Pulse 85  Temp(Src) 98 F (36.7 C) (Oral)  Ht 5\' 8"  (1.727 m)  Wt 210 lb (95.255 kg)  BMI 31.94 kg/m2  Objective:   Physical Exam  Constitutional: He appears well-developed and well-nourished.  HENT:  Throat is pink but not very red there is no exudate and there is no significant adenopathy. Sinuses are nontender to percussion  Pulmonary/Chest: Effort normal and breath sounds normal.          Assessment & Plan:  1. Sore throat To my surprise, strep test was easily positive. Will treat with amoxicillin 500 mg 3 times a day for 10 days. - POCT rapid strep A Wardell Honour MD

## 2015-02-22 ENCOUNTER — Telehealth: Payer: Self-pay | Admitting: Family Medicine

## 2015-02-22 DIAGNOSIS — R05 Cough: Secondary | ICD-10-CM

## 2015-02-22 DIAGNOSIS — R059 Cough, unspecified: Secondary | ICD-10-CM

## 2015-02-22 NOTE — Telephone Encounter (Signed)
Chest x-ray, CBC, give sample of Symbicort or breo, call in a prescription for Levaquin 500 one daily for 7 days and get appointment with pulmonology. Continue Mucinex and nasal saline When patient comes in for chest x-ray let me listen to chest briefly in x-ray Continue with cool mist humidification

## 2015-02-22 NOTE — Telephone Encounter (Signed)
Pt still has lingering cough -  Since 01/10/15 he has taken   Depo Medrol 60 shot pred 10 taper And AMOX 500 for 10 days  Still doing Mucinex regularly.  Still doing flonase and AYR  Still has cough  - non productive Any other suggestions

## 2015-02-23 ENCOUNTER — Ambulatory Visit (INDEPENDENT_AMBULATORY_CARE_PROVIDER_SITE_OTHER): Payer: 59

## 2015-02-23 ENCOUNTER — Other Ambulatory Visit: Payer: Self-pay | Admitting: *Deleted

## 2015-02-23 ENCOUNTER — Other Ambulatory Visit (INDEPENDENT_AMBULATORY_CARE_PROVIDER_SITE_OTHER): Payer: 59

## 2015-02-23 DIAGNOSIS — R059 Cough, unspecified: Secondary | ICD-10-CM

## 2015-02-23 DIAGNOSIS — R05 Cough: Secondary | ICD-10-CM

## 2015-02-23 MED ORDER — LEVOFLOXACIN 500 MG PO TABS: 500.0000 mg | ORAL_TABLET | Freq: Every day | ORAL | Status: DC

## 2015-02-23 MED ORDER — FLUTICASONE FUROATE-VILANTEROL 100-25 MCG/INH IN AEPB: 1.0000 | INHALATION_SPRAY | Freq: Every day | RESPIRATORY_TRACT | Status: DC

## 2015-02-23 MED ORDER — BUDESONIDE-FORMOTEROL FUMARATE 160-4.5 MCG/ACT IN AERO: 2.0000 | INHALATION_SPRAY | Freq: Two times a day (BID) | RESPIRATORY_TRACT | Status: DC

## 2015-02-23 NOTE — Progress Notes (Signed)
Lab only 

## 2015-02-23 NOTE — Telephone Encounter (Signed)
Pt aware  -= he will come by at 10:00 for cxr and cbc Breo sample to be given and levaquin called in

## 2015-02-24 LAB — CBC WITH DIFFERENTIAL/PLATELET
BASOS ABS: 0 10*3/uL (ref 0.0–0.2)
Basos: 1 %
EOS (ABSOLUTE): 0.1 10*3/uL (ref 0.0–0.4)
Eos: 2 %
HEMATOCRIT: 44.4 % (ref 37.5–51.0)
Hemoglobin: 14.8 g/dL (ref 12.6–17.7)
IMMATURE GRANULOCYTES: 0 %
Immature Grans (Abs): 0 10*3/uL (ref 0.0–0.1)
LYMPHS ABS: 2.4 10*3/uL (ref 0.7–3.1)
Lymphs: 36 %
MCH: 29.5 pg (ref 26.6–33.0)
MCHC: 33.3 g/dL (ref 31.5–35.7)
MCV: 89 fL (ref 79–97)
MONOS ABS: 0.5 10*3/uL (ref 0.1–0.9)
Monocytes: 8 %
NEUTROS PCT: 53 %
Neutrophils Absolute: 3.6 10*3/uL (ref 1.4–7.0)
PLATELETS: 260 10*3/uL (ref 150–379)
RBC: 5.01 x10E6/uL (ref 4.14–5.80)
RDW: 13.2 % (ref 12.3–15.4)
WBC: 6.6 10*3/uL (ref 3.4–10.8)

## 2015-03-29 ENCOUNTER — Emergency Department (HOSPITAL_COMMUNITY)
Admission: EM | Admit: 2015-03-29 | Discharge: 2015-03-29 | Disposition: A | Discharge status: Home / Self Care | Payer: 59 | Attending: Emergency Medicine | Admitting: Emergency Medicine

## 2015-03-29 ENCOUNTER — Encounter (HOSPITAL_COMMUNITY): Payer: Self-pay | Admitting: Emergency Medicine

## 2015-03-29 DIAGNOSIS — E785 Hyperlipidemia, unspecified: Secondary | ICD-10-CM | POA: Insufficient documentation

## 2015-03-29 DIAGNOSIS — E559 Vitamin D deficiency, unspecified: Secondary | ICD-10-CM | POA: Insufficient documentation

## 2015-03-29 DIAGNOSIS — Z86010 Personal history of colonic polyps: Secondary | ICD-10-CM | POA: Insufficient documentation

## 2015-03-29 DIAGNOSIS — G4730 Sleep apnea, unspecified: Secondary | ICD-10-CM | POA: Insufficient documentation

## 2015-03-29 DIAGNOSIS — Z79899 Other long term (current) drug therapy: Secondary | ICD-10-CM | POA: Insufficient documentation

## 2015-03-29 DIAGNOSIS — I471 Supraventricular tachycardia: Secondary | ICD-10-CM

## 2015-03-29 DIAGNOSIS — Z9981 Dependence on supplemental oxygen: Secondary | ICD-10-CM | POA: Insufficient documentation

## 2015-03-29 LAB — BASIC METABOLIC PANEL
ANION GAP: 11 (ref 5–15)
BUN: 20 mg/dL (ref 6–20)
CHLORIDE: 103 mmol/L (ref 101–111)
CO2: 25 mmol/L (ref 22–32)
Calcium: 9.2 mg/dL (ref 8.9–10.3)
Creatinine, Ser: 1.03 mg/dL (ref 0.61–1.24)
GFR calc Af Amer: >60 mL/min (ref >60)
Glucose, Bld: 135 mg/dL — ABNORMAL HIGH (ref 65–99)
POTASSIUM: 3.9 mmol/L (ref 3.5–5.1)
SODIUM: 139 mmol/L (ref 135–145)

## 2015-03-29 LAB — CBC
HCT: 46.4 % (ref 39.0–52.0)
HEMOGLOBIN: 16 g/dL (ref 13.0–17.0)
MCH: 29.9 pg (ref 26.0–34.0)
MCHC: 34.5 g/dL (ref 30.0–36.0)
MCV: 86.7 fL (ref 78.0–100.0)
PLATELETS: 283 10*3/uL (ref 150–400)
RBC: 5.35 MIL/uL (ref 4.22–5.81)
RDW: 12.9 % (ref 11.5–15.5)
WBC: 11.6 10*3/uL — AB (ref 4.0–10.5)

## 2015-03-29 LAB — I-STAT TROPONIN, ED: TROPONIN I, POC: 0.04 ng/mL (ref 0.00–0.08)

## 2015-03-29 NOTE — ED Notes (Signed)
Patient here with complaint of chest palpitations with history of SVT. Previous incidents usually resolved with cessation of activity. HR 160 in triage.

## 2015-03-29 NOTE — Discharge Instructions (Signed)
Please follow up with your cardiologist!  Return to ER for new or worsening symptoms, any additional concerns.

## 2015-03-29 NOTE — ED Provider Notes (Signed)
CSN: [id number]     Arrival date & time 03/29/15  2059 History   First MD Initiated Contact with Patient 03/29/15 2218     Chief Complaint  Patient presents with  . Palpitations  . Tachycardia     (Consider location/radiation/quality/duration/timing/severity/associated sxs/prior Treatment) The history is provided by the patient and medical records. No language interpreter was used.    Daniel Macias is a 65 y.o. male  with a PMH of HTN, HLD, OSA who presents to the Emergency Department complaining of palpitations associated with fatigue that began today at 3:30 pm. Patient has history of similar events - this feels similar, however duration was much longer. Usually lasts 20 minutes - 1.5 hours but today was ongoing for > 6 hours. No increased level of activity today. At present, patient feels as if symptoms are resolving but not completely resolved. He was seen for the same by cardiology, Dr. Percival Spanish, who stated that this was likely SVT but did not have a rhythm captured to confirm. No medications taken PTA other than usual home meds. No alleviating or aggravating factors noted. Denies chest pain, shortness of breath, LOC.   Past Medical History  Diagnosis Date  . Hyperlipidemia   . Vitamin D deficiency   . Hx of colonic polyps   . Carpal tunnel syndrome, bilateral   . Allergy   . Sleep apnea     CPAP   Past Surgical History  Procedure Laterality Date  . Spine surgery  1999    DDD  . Cyst removal hand Right     wrist   . Leg surgery Left 1971   Family History  Problem Relation Age of Onset  . Hypertension Mother   . Cancer Mother     uterine  . Emphysema Father    Social History  Substance Use Topics  . Smoking status: Never Smoker   . Smokeless tobacco: None  . Alcohol Use: Yes     Comment: occasional    Review of Systems  Constitutional: Positive for fatigue. Negative for fever and chills.  HENT: Negative for congestion and sore throat.   Eyes: Negative for  visual disturbance.  Respiratory: Negative for cough, shortness of breath and wheezing.   Cardiovascular: Positive for palpitations. Negative for chest pain and leg swelling.  Gastrointestinal: Negative for nausea, vomiting and abdominal pain.  Genitourinary: Negative for dysuria.  Musculoskeletal: Negative for back pain and neck pain.  Skin: Negative for rash.  Neurological: Negative for syncope, weakness and headaches.      Allergies  Morphine and related  Home Medications   Prior to Admission medications   Medication Sig Start Date End Date Taking? Authorizing Provider  fluticasone (FLONASE) 50 MCG/ACT nasal spray Place 2 sprays into both nostrils daily as needed. 05/31/14  Yes Chipper Herb, MD  lisinopril (PRINIVIL,ZESTRIL) 10 MG tablet TAKE 1 TABLET (10 MG TOTAL) BY MOUTH DAILY. 01/14/15  Yes Chipper Herb, MD  rosuvastatin (CRESTOR) 10 MG tablet Take 1 tablet (10 mg total) by mouth daily. 05/31/14  Yes Chipper Herb, MD  Vitamin D, Ergocalciferol, (DRISDOL) 50000 UNITS CAPS capsule TAKE 1 CAPSULE EVERY 7 DAYS 05/31/14  Yes Chipper Herb, MD  amoxicillin (AMOXIL) 875 MG tablet Take 1 tablet (875 mg total) by mouth 2 (two) times daily. Patient not taking: Reported on 03/29/2015 02/03/15   Wardell Honour, MD  budesonide-formoterol [facility]) 160-4.5 MCG/ACT inhaler Inhale 2 puffs into the lungs 2 (two) times daily. Patient not taking: Reported  on 03/29/2015 02/23/15   Chipper Herb, MD  Fluticasone Furoate-Vilanterol 100-25 MCG/INH AEPB Inhale 1 puff into the lungs daily. Patient not taking: Reported on 03/29/2015 02/23/15   Chipper Herb, MD  levofloxacin (LEVAQUIN) 500 MG tablet Take 1 tablet (500 mg total) by mouth daily. Patient not taking: Reported on 03/29/2015 02/23/15   Chipper Herb, MD   BP 115/76 mmHg  Pulse 92  Temp(Src) 98.1 F (36.7 C) (Oral)  Resp 21  SpO2 97% Physical Exam  Constitutional: He is oriented to person, place, and time. He appears well-developed  and well-nourished.  Alert and in no acute distress  HENT:  Head: Normocephalic and atraumatic.  Cardiovascular: Normal rate, regular rhythm, normal heart sounds and intact distal pulses.  Exam reveals no gallop and no friction rub.   No murmur heard. Pulmonary/Chest: Effort normal and breath sounds normal. No respiratory distress. He has no wheezes. He has no rales. He exhibits no tenderness.  Abdominal: Soft. He exhibits no distension. There is no tenderness.  Musculoskeletal: He exhibits no edema.  Neurological: He is alert and oriented to person, place, and time.  Skin: Skin is warm and dry. No rash noted.  Cap refill < 3 seconds.  Psychiatric: He has a normal mood and affect. His behavior is normal. Judgment and thought content normal.  Nursing note and vitals reviewed.   ED Course  Procedures (including critical care time) Labs Review Labs Reviewed  BASIC METABOLIC PANEL - Abnormal; Notable for the following:    Glucose, Bld 135 (*)    All other components within normal limits  CBC - Abnormal; Notable for the following:    WBC 11.6 (*)    All other components within normal limits  I-STAT TROPOININ, ED    Imaging Review No results found. I have personally reviewed and evaluated these images and lab results as part of my medical decision-making.   EKG Interpretation None      MDM   Final diagnoses:  SVT (supraventricular tachycardia) ([place])   Daniel Macias presents with palpitations and fatigue. Initial EKG showed SVT with ventricular rate of 164 While in ED, patient felt improved, EKG in NSR with rate decreased to 96.   Labs: CBC, BMP, troponin reassuring.   Patient re-evaluated prior to discharge - symptoms have resolved. Patient feels comfortable going home and appears reasonable to follow up as encouraged.   A&P: SVT, now resolved - Follow up with cardiology for further evaluation and management.   Patient seen by and discussed with Dr. Lita Mains who  agrees with treatment plan.   [facility], PA-C 03/29/15 2325  Julianne Rice, MD 03/30/15 2111

## 2015-03-29 NOTE — ED Notes (Signed)
Dr Yelverton at bedside.  

## 2015-04-06 ENCOUNTER — Encounter: Payer: Self-pay | Admitting: Family Medicine

## 2015-04-06 ENCOUNTER — Ambulatory Visit (INDEPENDENT_AMBULATORY_CARE_PROVIDER_SITE_OTHER): Payer: Medicare Other | Admitting: Family Medicine

## 2015-04-06 ENCOUNTER — Encounter: Payer: Self-pay | Admitting: *Deleted

## 2015-04-06 ENCOUNTER — Telehealth: Payer: Self-pay | Admitting: Family Medicine

## 2015-04-06 VITALS — BP 123/64 | HR 86 | Temp 97.2°F | Ht 68.0 in | Wt 211.4 lb

## 2015-04-06 DIAGNOSIS — J0100 Acute maxillary sinusitis, unspecified: Secondary | ICD-10-CM | POA: Diagnosis not present

## 2015-04-06 MED ORDER — AMOXICILLIN-POT CLAVULANATE 875-125 MG PO TABS: 1.0000 | ORAL_TABLET | Freq: Two times a day (BID) | ORAL | Status: DC

## 2015-04-06 MED ORDER — LOSARTAN POTASSIUM 100 MG PO TABS: 100.0000 mg | ORAL_TABLET | Freq: Every day | ORAL | Status: DC

## 2015-04-06 MED ORDER — HYDROCODONE-HOMATROPINE 5-1.5 MG/5ML PO SYRP: 5.0000 mL | ORAL_SOLUTION | Freq: Three times a day (TID) | ORAL | Status: DC | PRN

## 2015-04-06 NOTE — Progress Notes (Signed)
   Subjective:    Patient ID: ONDRA KAMAI, male    DOB: 1950-12-19, 65 y.o.   MRN: [id number]  HPI 65 year old man with the productive cough sinus pressure pain drainage. He's had several rounds of antibiotics for bronchitis. He has also had some steroids. He has taken some of his wife's cough medicine and really thought that was effective.  Patient Active Problem List   Diagnosis Date Noted  . Palpitation 09/30/2013  . Allergic rhinitis 05/27/2013  . Hypertension 05/27/2013  . Metabolic syndrome [id number]  . BPH (benign prostatic hyperplasia) 11/25/2012  . Obstructive sleep apnea 11/25/2012  . Hyperlipemia 05/28/2012  . Vitamin D deficiency disease 05/28/2012   Outpatient Encounter Prescriptions as of 04/06/2015  Medication Sig  . budesonide-formoterol (SYMBICORT) 160-4.5 MCG/ACT inhaler Inhale 2 puffs into the lungs 2 (two) times daily.  . fluticasone (FLONASE) 50 MCG/ACT nasal spray Place 2 sprays into both nostrils daily as needed.  . Fluticasone Furoate-Vilanterol 100-25 MCG/INH AEPB Inhale 1 puff into the lungs daily.  Marland Kitchen lisinopril (PRINIVIL,ZESTRIL) 10 MG tablet TAKE 1 TABLET (10 MG TOTAL) BY MOUTH DAILY.  . rosuvastatin (CRESTOR) 10 MG tablet Take 1 tablet (10 mg total) by mouth daily.  . Vitamin D, Ergocalciferol, (DRISDOL) 50000 UNITS CAPS capsule TAKE 1 CAPSULE EVERY 7 DAYS  . [DISCONTINUED] amoxicillin (AMOXIL) 875 MG tablet Take 1 tablet (875 mg total) by mouth 2 (two) times daily. (Patient not taking: Reported on 03/29/2015)  . [DISCONTINUED] levofloxacin (LEVAQUIN) 500 MG tablet Take 1 tablet (500 mg total) by mouth daily. (Patient not taking: Reported on 03/29/2015)   No facility-administered encounter medications on file as of 04/06/2015.      Review of Systems  Constitutional: Negative.   HENT: Positive for congestion.   Respiratory: Positive for cough.        Objective:   Physical Exam  Constitutional: He is oriented to person, place, and time. He appears  well-developed and well-nourished.  HENT:  Maxillary sinuses are tender to percussion  Cardiovascular: Normal rate and regular rhythm.   Pulmonary/Chest: Effort normal and breath sounds normal. He has no wheezes.  Neurological: He is alert and oriented to person, place, and time.          Assessment & Plan:  1. Acute maxillary sinusitis, recurrence not specified I believe he has a sinus infection. The drainage is triggering the cough but I'm also concerned that the lisinopril may play a role in his cough. Begin Augmentin 875 twice a day. Hycodan 1 teaspoon 3 times a day as needed for cough. Discontinued 10 mg lisinopril and begin losartan 100 mg. Follow blood pressure and cough.  Wardell Honour MD

## 2015-04-06 NOTE — Addendum Note (Signed)
Addended by: Jamelle Haring on: 04/06/2015 10:30 AM   Modules accepted: Miquel Dunn

## 2015-04-22 ENCOUNTER — Ambulatory Visit (INDEPENDENT_AMBULATORY_CARE_PROVIDER_SITE_OTHER): Payer: Medicare Other | Admitting: Family Medicine

## 2015-04-22 ENCOUNTER — Encounter: Payer: Self-pay | Admitting: Family Medicine

## 2015-04-22 VITALS — BP 104/66 | HR 80 | Temp 97.3°F | Ht 68.0 in | Wt 210.0 lb

## 2015-04-22 DIAGNOSIS — I471 Supraventricular tachycardia: Secondary | ICD-10-CM

## 2015-04-22 DIAGNOSIS — I10 Essential (primary) hypertension: Secondary | ICD-10-CM

## 2015-04-22 DIAGNOSIS — E785 Hyperlipidemia, unspecified: Secondary | ICD-10-CM

## 2015-04-22 DIAGNOSIS — E559 Vitamin D deficiency, unspecified: Secondary | ICD-10-CM | POA: Diagnosis not present

## 2015-04-22 DIAGNOSIS — N400 Enlarged prostate without lower urinary tract symptoms: Secondary | ICD-10-CM | POA: Diagnosis not present

## 2015-04-22 MED ORDER — HYDROCODONE-HOMATROPINE 5-1.5 MG/5ML PO SYRP: 5.0000 mL | ORAL_SOLUTION | Freq: Two times a day (BID) | ORAL | Status: DC | PRN

## 2015-04-22 NOTE — Patient Instructions (Addendum)
Medicare Annual Wellness Visit  [facility] and the medical providers at [facility] strive to bring you the best medical care.  In doing so we not only want to address your current medical conditions and concerns but also to detect new conditions early and prevent illness, disease and health-related problems.    Medicare offers a yearly Wellness Visit which allows our clinical staff to assess your need for preventative services including immunizations, lifestyle education, counseling to decrease risk of preventable diseases and screening for fall risk and other medical concerns.    This visit is provided free of charge (no copay) for all Medicare recipients. The clinical pharmacists at [facility] have begun to conduct these Wellness Visits which will also include a thorough review of all your medications.    As you primary medical provider recommend that you make an appointment for your Annual Wellness Visit if you have not done so already this year.  You may set up this appointment before you leave today or you may call back [phone] and schedule an appointment.  Please make sure when you call that you mention that you are scheduling your Annual Wellness Visit with the clinical pharmacist so that the appointment may be made for the proper length of time.     Continue current medications. Continue good therapeutic lifestyle changes which include good diet and exercise. Fall precautions discussed with patient. If an FOBT was given today- please return it to our front desk. If you are over 26 years old - you may need Prevnar 64 or the adult Pneumonia vaccine.  **Flu shots are available--- please call and schedule a FLU-CLINIC appointment**  After your visit with [place] today you will receive a survey in the mail or online from [employer] regarding your care with [place]. Please take a moment to fill this out. Your feedback is very  important to [place] as you can help [place] better understand your patient needs as well as improve your experience and satisfaction. WE CARE ABOUT YOU!!!   The patient should reduce his low Sartin to one half of 100 mg and monitor his blood pressures frequently over the next couple weeks. If he is not seeing the cardiologist by that time he should bring these readings by the office for [place] to review. He still has a cough. We will see if reducing the low Sartin will help the cough and normalize the blood pressure. If he sees the cardiologist before the next 2 weeks she should take these readings with him to see the cardiologist He should avoid caffeine as much as possible He should start a probiotic like align to see if this helps with his bowel movements We will refill his Hycodan cough medicine to use as needed because of his persistent cough We will call with the lab work once it these results become available

## 2015-04-22 NOTE — Progress Notes (Signed)
Subjective:    Patient ID: Daniel Macias, male    DOB: 16-Feb-1950, 65 y.o.   MRN: [id number]  HPI Pt here for follow up and management of chronic medical problems which includes hypertension and hyperlipidemia. He is taking medications regularly. It is significant to note that this patient had a recent episode that was prolonged of supraventricular tachycardia and ended up in the emergency room. This lasted for about 5 hours according to the patient. He was not started on any additional medication. He has not seen the cardiologist since this event and we will arrange for him to see the cardiologist again about this. The patient still has a cough and it was concerning that the ACE inhibitor might be contributing to this to. He is now taking low Sartin and his blood pressures are running fairly low and we will consider maybe reducing his losartan to 50 mg versus 100 mg. He will get lab work today. The patient denies any chest pain or chest pressure or shortness of breath. He did not have any during his supraventricular tachycardia episode. He says he just felt like he had run a marathon. He is not having any problems with swallowing heartburn indigestion nausea vomiting diarrhea or blood in the stool. He does complain of stools that are soft and cleansing problems after bowel movements. He is passing his water without problems.      Patient Active Problem List   Diagnosis Date Noted  . Palpitation 09/30/2013  . Allergic rhinitis 05/27/2013  . Hypertension 05/27/2013  . Metabolic syndrome 35/57/3220  . BPH (benign prostatic hyperplasia) 11/25/2012  . Obstructive sleep apnea 11/25/2012  . Hyperlipemia 05/28/2012  . Vitamin D deficiency disease 05/28/2012   Outpatient Encounter Prescriptions as of 04/22/2015  Medication Sig  . budesonide-formoterol (SYMBICORT) 160-4.5 MCG/ACT inhaler Inhale 2 puffs into the lungs 2 (two) times daily.  . fluticasone (FLONASE) 50 MCG/ACT nasal spray Place 2  sprays into both nostrils daily as needed.  Marland Kitchen losartan (COZAAR) 100 MG tablet Take 1 tablet (100 mg total) by mouth daily.  . rosuvastatin (CRESTOR) 10 MG tablet Take 1 tablet (10 mg total) by mouth daily.  . Vitamin D, Ergocalciferol, (DRISDOL) 50000 UNITS CAPS capsule TAKE 1 CAPSULE EVERY 7 DAYS  . HYDROcodone-homatropine (HYCODAN) 5-1.5 MG/5ML syrup Take 5 mLs by mouth every 8 (eight) hours as needed for cough. (Patient not taking: Reported on 04/22/2015)  . [DISCONTINUED] amoxicillin (AMOXIL) 875 MG tablet Take 1 tablet (875 mg total) by mouth 2 (two) times daily. (Patient not taking: Reported on 03/29/2015)  . [DISCONTINUED] amoxicillin-clavulanate (AUGMENTIN) 875-125 MG tablet Take 1 tablet by mouth 2 (two) times daily.  . [DISCONTINUED] Fluticasone Furoate-Vilanterol 100-25 MCG/INH AEPB Inhale 1 puff into the lungs daily.  . [DISCONTINUED] levofloxacin (LEVAQUIN) 500 MG tablet Take 1 tablet (500 mg total) by mouth daily. (Patient not taking: Reported on 03/29/2015)  . [DISCONTINUED] lisinopril (PRINIVIL,ZESTRIL) 10 MG tablet TAKE 1 TABLET (10 MG TOTAL) BY MOUTH DAILY.   No facility-administered encounter medications on file as of 04/22/2015.      Review of Systems  Constitutional: Negative.   HENT: Negative.   Eyes: Negative.   Respiratory: Positive for cough.   Cardiovascular: Negative.   Gastrointestinal: Negative.   Endocrine: Negative.   Genitourinary: Negative.   Musculoskeletal: Negative.   Skin: Negative.   Allergic/Immunologic: Negative.   Neurological: Negative.   Hematological: Negative.   Psychiatric/Behavioral: Negative.        Objective:   Physical Exam  Constitutional: He is oriented to person, place, and time. He appears well-developed and well-nourished. No distress.  HENT:  Head: Normocephalic and atraumatic.  Right Ear: External ear normal.  Left Ear: External ear normal.  Nose: Nose normal.  Mouth/Throat: Oropharynx is clear and moist. No oropharyngeal  exudate.  Eyes: Conjunctivae and EOM are normal. Pupils are equal, round, and reactive to light. Right eye exhibits no discharge. Left eye exhibits no discharge. No scleral icterus.  Neck: Normal range of motion. Neck supple. No thyromegaly present.  Cardiovascular: Normal rate, regular rhythm, normal heart sounds and intact distal pulses.   No murmur heard. The heart has a regular rate and rhythm at 72/m  Pulmonary/Chest: Effort normal and breath sounds normal. No respiratory distress. He has no wheezes. He has no rales. He exhibits no tenderness.  Clear anteriorly and posteriorly is no axillary adenopathy  Abdominal: Soft. Bowel sounds are normal. He exhibits no mass. There is no tenderness. There is no rebound and no guarding.  No abdominal tenderness liver or spleen enlargement or inguinal adenopathy  Musculoskeletal: Normal range of motion. He exhibits no edema or tenderness.  Lymphadenopathy:    He has no cervical adenopathy.  Neurological: He is alert and oriented to person, place, and time. He has normal reflexes. No cranial nerve deficit.  Skin: Skin is warm and dry. No rash noted.  Psychiatric: He has a normal mood and affect. His behavior is normal. Judgment and thought content normal.  Nursing note and vitals reviewed.  BP 104/66 mmHg  Pulse 80  Temp(Src) 97.3 F (36.3 C) (Oral)  Ht '5\' 8"'  (1.727 m)  Wt 210 lb (95.255 kg)  BMI 31.94 kg/m2        Assessment & Plan:  1. Vitamin D deficiency disease -Continue vitamin D replacement pending results of lab work - CBC with Differential/Platelet - VITAMIN D 25 Hydroxy (Vit-D Deficiency, Fractures)  2. Hyperlipemia -Continue cholesterol treatment and aggressive therapeutic lifestyle changes - CBC with Differential/Platelet - NMR, lipoprofile - Ambulatory referral to Cardiology  3. Essential hypertension -Reduce losartan to 50 mg daily and check blood pressures regularly and monitor this reduction in medicine on the cough  symptoms - BMP8+EGFR - CBC with Differential/Platelet - Hepatic function panel - Ambulatory referral to Cardiology  4. BPH (benign prostatic hyperplasia) -Prostate exam will be done next visit - CBC with Differential/Platelet  5. SVT (supraventricular tachycardia) ([facility]) -Avoid caffeine as much as possible and follow-up with cardiology as planned - CBC with Differential/Platelet - Ambulatory referral to Cardiology  Meds ordered this encounter  Medications  . HYDROcodone-homatropine (HYCODAN) 5-1.5 MG/5ML syrup    Sig: Take 5 mLs by mouth every 12 (twelve) hours as needed for cough.    Dispense:  120 mL    Refill:  0   Patient Instructions                       Medicare Annual Wellness Visit  [facility] and the medical providers at [facility] strive to bring you the best medical care.  In doing so we not only want to address your current medical conditions and concerns but also to detect new conditions early and prevent illness, disease and health-related problems.    Medicare offers a yearly Wellness Visit which allows our clinical staff to assess your need for preventative services including immunizations, lifestyle education, counseling to decrease risk of preventable diseases and screening for fall risk and other medical concerns.  This visit is provided free of charge (no copay) for all Medicare recipients. The clinical pharmacists at [facility] have begun to conduct these Wellness Visits which will also include a thorough review of all your medications.    As you primary medical provider recommend that you make an appointment for your Annual Wellness Visit if you have not done so already this year.  You may set up this appointment before you leave today or you may call back [phone]) and schedule an appointment.  Please make sure when you call that you mention that you are scheduling your Annual Wellness Visit with the clinical  pharmacist so that the appointment may be made for the proper length of time.     Continue current medications. Continue good therapeutic lifestyle changes which include good diet and exercise. Fall precautions discussed with patient. If an FOBT was given today- please return it to our front desk. If you are over 27 years old - you may need Prevnar 36 or the adult Pneumonia vaccine.  **Flu shots are available--- please call and schedule a FLU-CLINIC appointment**  After your visit with [place] today you will receive a survey in the mail or online from [employer] regarding your care with [place]. Please take a moment to fill this out. Your feedback is very important to [place] as you can help [place] better understand your patient needs as well as improve your experience and satisfaction. WE CARE ABOUT YOU!!!   The patient should reduce his low Sartin to one half of 100 mg and monitor his blood pressures frequently over the next couple weeks. If he is not seeing the cardiologist by that time he should bring these readings by the office for [place] to review. He still has a cough. We will see if reducing the low Sartin will help the cough and normalize the blood pressure. If he sees the cardiologist before the next 2 weeks she should take these readings with him to see the cardiologist He should avoid caffeine as much as possible He should start a probiotic like align to see if this helps with his bowel movements We will refill his Hycodan cough medicine to use as needed because of his persistent cough We will call with the lab work once it these results become available   Arrie Senate MD

## 2015-04-23 LAB — BMP8+EGFR
BUN / CREAT RATIO: 17 (ref 10–22)
BUN: 13 mg/dL (ref 8–27)
CALCIUM: 8.9 mg/dL (ref 8.6–10.2)
CHLORIDE: 103 mmol/L (ref 96–106)
CO2: 23 mmol/L (ref 18–29)
Creatinine, Ser: 0.77 mg/dL (ref 0.76–1.27)
GFR, EST AFRICAN AMERICAN: 109 mL/min/{1.73_m2} (ref >59)
GFR, EST NON AFRICAN AMERICAN: 95 mL/min/{1.73_m2} (ref >59)
Glucose: 119 mg/dL — ABNORMAL HIGH (ref 65–99)
POTASSIUM: 4.2 mmol/L (ref 3.5–5.2)
SODIUM: 141 mmol/L (ref 134–144)

## 2015-04-23 LAB — CBC WITH DIFFERENTIAL/PLATELET
BASOS ABS: 0 10*3/uL (ref 0.0–0.2)
BASOS: 1 %
EOS (ABSOLUTE): 0.1 10*3/uL (ref 0.0–0.4)
Eos: 1 %
Hematocrit: 41.5 % (ref 37.5–51.0)
Hemoglobin: 14.1 g/dL (ref 12.6–17.7)
IMMATURE GRANS (ABS): 0 10*3/uL (ref 0.0–0.1)
Immature Granulocytes: 0 %
LYMPHS ABS: 1.3 10*3/uL (ref 0.7–3.1)
LYMPHS: 32 %
MCH: 29.9 pg (ref 26.6–33.0)
MCHC: 34 g/dL (ref 31.5–35.7)
MCV: 88 fL (ref 79–97)
MONOS ABS: 0.3 10*3/uL (ref 0.1–0.9)
Monocytes: 8 %
NEUTROS ABS: 2.4 10*3/uL (ref 1.4–7.0)
Neutrophils: 58 %
PLATELETS: 244 10*3/uL (ref 150–379)
RBC: 4.71 x10E6/uL (ref 4.14–5.80)
RDW: 13.6 % (ref 12.3–15.4)
WBC: 4.1 10*3/uL (ref 3.4–10.8)

## 2015-04-23 LAB — HEPATIC FUNCTION PANEL
ALT: 23 IU/L (ref 0–44)
AST: 23 IU/L (ref 0–40)
Albumin: 4 g/dL (ref 3.6–4.8)
Alkaline Phosphatase: 66 IU/L (ref 39–117)
BILIRUBIN, DIRECT: 0.16 mg/dL (ref 0.00–0.40)
Bilirubin Total: 0.4 mg/dL (ref 0.0–1.2)
TOTAL PROTEIN: 7 g/dL (ref 6.0–8.5)

## 2015-04-23 LAB — VITAMIN D 25 HYDROXY (VIT D DEFICIENCY, FRACTURES): VIT D 25 HYDROXY: 42.5 ng/mL (ref 30.0–100.0)

## 2015-04-23 LAB — NMR, LIPOPROFILE
Cholesterol: 126 mg/dL (ref 100–199)
HDL Cholesterol by NMR: 54 mg/dL (ref >39)
HDL Particle Number: 38.6 umol/L (ref >=30.5)
LDL PARTICLE NUMBER: 604 nmol/L (ref <1000)
LDL SIZE: 20.8 nm (ref >20.5)
LDL-C: 61 mg/dL (ref 0–99)
LP-IR SCORE: 27 (ref <=45)
Small LDL Particle Number: 259 nmol/L (ref <=527)
TRIGLYCERIDES BY NMR: 55 mg/dL (ref 0–149)

## 2015-06-02 ENCOUNTER — Ambulatory Visit (INDEPENDENT_AMBULATORY_CARE_PROVIDER_SITE_OTHER): Payer: Medicare Other | Admitting: Pharmacist

## 2015-06-02 ENCOUNTER — Encounter: Payer: Self-pay | Admitting: Pharmacist

## 2015-06-02 VITALS — BP 122/74 | HR 88 | Ht 67.5 in | Wt 212.0 lb

## 2015-06-02 DIAGNOSIS — Z23 Encounter for immunization: Secondary | ICD-10-CM | POA: Diagnosis not present

## 2015-06-02 DIAGNOSIS — Z0000 Encounter for general adult medical examination without abnormal findings: Secondary | ICD-10-CM

## 2015-06-02 DIAGNOSIS — R7303 Prediabetes: Secondary | ICD-10-CM | POA: Insufficient documentation

## 2015-06-02 NOTE — Patient Instructions (Addendum)
[name] , Thank you for taking time to come for your Medicare Wellness Visit. I appreciate your ongoing commitment to your health goals. Please review the following plan we discussed and let me know if I can assist you in the future.   These are the goals we discussed: Stay active - goal is 150 minutes of physical activity each week.   Increase non-starchy vegetables - carrots, green bean, squash, zucchini, tomatoes, onions, peppers, spinach and other green leafy vegetables, cabbage, lettuce, cucumbers, asparagus, okra (not fried), eggplant Limit sugar and processed foods (cakes, cookies, ice cream, crackers and chips) Increase fresh fruit but limit serving sizes 1/2 cup or about the size of tennis or baseball Limit red meat to no more than 1-2 times per week (serving size about the size of your palm) Choose whole grains / lean proteins - whole wheat bread, quinoa, whole grain rice (1/2 cup), fish, chicken, [place] Avoid sugar and calorie containing beverages - soda, sweet tea and juice.  Choose water or unsweetened tea instead.   This is a list of the screening recommended for you and due dates:  Health Maintenance  Topic Date Due  . Pneumonia vaccines (2 of 2 - PPSV23) 06/01/2015  .  Hepatitis C: One time screening is recommended by Center for Disease Control  (CDC) for  adults born from [birth year] through 1967.   07/01/2015*  . Stool Blood Test  06/24/2015  . Flu Shot  09/13/2015  . Colon Cancer Screening  11/12/2021  . Tetanus Vaccine  11/26/2022  . Shingles Vaccine  Completed  *Topic was postponed. The date shown is not the original due date.    Health Maintenance, Male A healthy lifestyle and preventative care can promote health and wellness.  Maintain regular health, dental, and eye exams.  Eat a healthy diet. Foods like vegetables, fruits, whole grains, low-fat dairy products, and lean protein foods contain the nutrients you need and are low in calories. Decrease your intake of  foods high in solid fats, added sugars, and salt. Get information about a proper diet from your health care provider, if necessary.  Regular physical exercise is one of the most important things you can do for your health. Most adults should get at least 150 minutes of moderate-intensity exercise (any activity that increases your heart rate and causes you to sweat) each week. In addition, most adults need muscle-strengthening exercises on 2 or more days a week.   Maintain a healthy weight. The body mass index (BMI) is a screening tool to identify possible weight problems. It provides an estimate of body fat based on height and weight. Your health care provider can find your BMI and can help you achieve or maintain a healthy weight. For males 20 years and older:  A BMI below 18.5 is considered underweight.  A BMI of 18.5 to 24.9 is normal.  A BMI of 25 to 29.9 is considered overweight.  A BMI of 30 and above is considered obese.  Maintain normal blood lipids and cholesterol by exercising and minimizing your intake of saturated fat. Eat a balanced diet with plenty of fruits and vegetables. Blood tests for lipids and cholesterol should begin at age 71 and be repeated every 5 years. If your lipid or cholesterol levels are high, you are over age 53, or you are at high risk for heart disease, you may need your cholesterol levels checked more frequently.Ongoing high lipid and cholesterol levels should be treated with medicines if diet and exercise are  not working.  If you smoke, find out from your health care provider how to quit. If you do not use tobacco, do not start.  Lung cancer screening is recommended for adults aged 21-80 years who are at high risk for developing lung cancer because of a history of smoking. A yearly low-dose CT scan of the lungs is recommended for people who have at least a 30-pack-year history of smoking and are current smokers or have quit within the past 15 years. A pack year  of smoking is smoking an average of 1 pack of cigarettes a day for 1 year (for example, a 30-pack-year history of smoking could mean smoking 1 pack a day for 30 years or 2 packs a day for 15 years). Yearly screening should continue until the smoker has stopped smoking for at least 15 years. Yearly screening should be stopped for people who develop a health problem that would prevent them from having lung cancer treatment.  If you choose to drink alcohol, do not have more than 2 drinks per day. One drink is considered to be 12 oz (360 mL) of beer, 5 oz (150 mL) of wine, or 1.5 oz (45 mL) of liquor.  Avoid the use of street drugs. Do not share needles with anyone. Ask for help if you need support or instructions about stopping the use of drugs.  High blood pressure causes heart disease and increases the risk of stroke. High blood pressure is more likely to develop in:  People who have blood pressure in the end of the normal range (100-139/85-89 mm Hg).  People who are overweight or obese.  People who are African American.  If you are 79-17 years of age, have your blood pressure checked every 3-5 years. If you are 42 years of age or older, have your blood pressure checked every year. You should have your blood pressure measured twice--once when you are at a hospital or clinic, and once when you are not at a hospital or clinic. Record the average of the two measurements. To check your blood pressure when you are not at a hospital or clinic, you can use:  An automated blood pressure machine at a pharmacy.  A home blood pressure monitor.  If you are 60-37 years old, ask your health care provider if you should take aspirin to prevent heart disease.  Diabetes screening involves taking a blood sample to check your fasting blood sugar level. This should be done once every 3 years after age 3 if you are at a normal weight and without risk factors for diabetes. Testing should be considered at a younger  age or be carried out more frequently if you are overweight and have at least 1 risk factor for diabetes.  Colorectal cancer can be detected and often prevented. Most routine colorectal cancer screening begins at the age of 27 and continues through age 10. However, your health care provider may recommend screening at an earlier age if you have risk factors for colon cancer. On a yearly basis, your health care provider may provide home test kits to check for hidden blood in the stool. A small camera at the end of a tube may be used to directly examine the colon (sigmoidoscopy or colonoscopy) to detect the earliest forms of colorectal cancer. Talk to your health care provider about this at age 36 when routine screening begins. A direct exam of the colon should be repeated every 5-10 years through age 71, unless early forms of precancerous  polyps or small growths are found.  People who are at an increased risk for hepatitis B should be screened for this virus. You are considered at high risk for hepatitis B if:  You were born in a country where hepatitis B occurs often. Talk with your health care provider about which countries are considered high risk.  Your parents were born in a high-risk country and you have not received a shot to protect against hepatitis B (hepatitis B vaccine).  You have HIV or AIDS.  You use needles to inject street drugs.  You live with, or have sex with, someone who has hepatitis B.  You are a man who has sex with other men (MSM).  You get hemodialysis treatment.  You take certain medicines for conditions like cancer, organ transplantation, and autoimmune conditions.  Hepatitis C blood testing is recommended for all people born from [birth year] through 1965 and any individual with known risk factors for hepatitis C.  Healthy men should no longer receive prostate-specific antigen (PSA) blood tests as part of routine cancer screening. Talk to your health care provider about  prostate cancer screening.  Testicular cancer screening is not recommended for adolescents or adult males who have no symptoms. Screening includes self-exam, a health care provider exam, and other screening tests. Consult with your health care provider about any symptoms you have or any concerns you have about testicular cancer.  Practice safe sex. Use condoms and avoid high-risk sexual practices to reduce the spread of sexually transmitted infections (STIs).  You should be screened for STIs, including gonorrhea and chlamydia if:  You are sexually active and are younger than 24 years.  You are older than 24 years, and your health care provider tells you that you are at risk for this type of infection.  Your sexual activity has changed since you were last screened, and you are at an increased risk for chlamydia or gonorrhea. Ask your health care provider if you are at risk.  If you are at risk of being infected with HIV, it is recommended that you take a prescription medicine daily to prevent HIV infection. This is called pre-exposure prophylaxis (PrEP). You are considered at risk if:  You are a man who has sex with other men (MSM).  You are a heterosexual man who is sexually active with multiple partners.  You take drugs by injection.  You are sexually active with a partner who has HIV.  Talk with your health care provider about whether you are at high risk of being infected with HIV. If you choose to begin PrEP, you should first be tested for HIV. You should then be tested every 3 months for as long as you are taking PrEP.  Use sunscreen. Apply sunscreen liberally and repeatedly throughout the day. You should seek shade when your shadow is shorter than you. Protect yourself by wearing long sleeves, pants, a wide-brimmed hat, and sunglasses year round whenever you are outdoors.  Tell your health care provider of new moles or changes in moles, especially if there is a change in shape or  color. Also, tell your health care provider if a mole is larger than the size of a pencil eraser.  A one-time screening for abdominal aortic aneurysm (AAA) and surgical repair of large AAAs by ultrasound is recommended for men aged 43-75 years who are current or former smokers.  Stay current with your vaccines (immunizations).   This information is not intended to replace advice given to you by  your health care provider. Make sure you discuss any questions you have with your health care provider.   Document Released: 07/28/2007 Document Revised: 02/19/2014 Document Reviewed: 06/26/2010 Elsevier Interactive Patient Education [employer].

## 2015-06-02 NOTE — Progress Notes (Signed)
Patient ID: Daniel Macias, male   DOB: [birth year], 65 y.o.   MRN: [id number]    Subjective:   Daniel Macias is a 65 y.o. male who presents for an Initial Medicare Annual Wellness Visit. Daniel Macias is married.  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  He remains very active and has no compliants today regarding his health  Review of Systems  Review of Systems  Constitutional: Negative.   HENT: Negative.   Eyes: Negative.   Respiratory: Negative.   Cardiovascular: Negative.   Gastrointestinal: Negative.   Genitourinary: Negative.   Musculoskeletal: Negative.   Skin: Negative.   Neurological: Negative.   Endo/Heme/Allergies: Negative.   Psychiatric/Behavioral: Negative.       Current Medications (verified) Outpatient Encounter Prescriptions as of 06/02/2015  Medication Sig  . fluticasone (FLONASE) 50 MCG/ACT nasal spray Place 2 sprays into both nostrils daily as needed.  Marland Kitchen losartan (COZAAR) 100 MG tablet Take 1 tablet (100 mg total) by mouth daily.  . rosuvastatin (CRESTOR) 10 MG tablet Take 1 tablet (10 mg total) by mouth daily.  . Vitamin D, Ergocalciferol, (DRISDOL) 50000 UNITS CAPS capsule TAKE 1 CAPSULE EVERY 7 DAYS  . [DISCONTINUED] budesonide-formoterol (SYMBICORT) 160-4.5 MCG/ACT inhaler Inhale 2 puffs into the lungs 2 (two) times daily. (Patient not taking: Reported on 06/02/2015)  . [DISCONTINUED] HYDROcodone-homatropine (HYCODAN) 5-1.5 MG/5ML syrup Take 5 mLs by mouth every 12 (twelve) hours as needed for cough. (Patient not taking: Reported on 06/02/2015)   No facility-administered encounter medications on file as of 06/02/2015.    Allergies (verified) Morphine and related and Lisinopril   History: Past Medical History  Diagnosis Date  . Hyperlipidemia   . Vitamin D deficiency   . Hx of colonic polyps   . Carpal tunnel syndrome, bilateral   . Allergy   . Sleep apnea     CPAP  . Hypertension   . Lower leg  fracture    Past Surgical History  Procedure Laterality Date  . Spine surgery  1999    DDD  . Cyst removal hand Right     wrist   . Leg surgery Left 1971   Family History  Problem Relation Age of Onset  . Hypertension Mother   . Cancer Mother     uterine -METS to lungs  . Emphysema Father   . Heart disease Sister   . Heart attack Brother 57   Social History   Occupational History  . Not on file.   Social History Main Topics  . Smoking status: Never Smoker   . Smokeless tobacco: Never Used  . Alcohol Use: Yes     Comment: occasional  . Drug Use: No  . Sexual Activity: Yes    Do you feel safe at home?  Yes Are there smokers in your home (other than you)? No  Dietary issues and exercise activities discussed: Current Exercise Habits: The patient has a physically strenous job, but has no regular exercise apart from work. 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 He does not eat a lot of fast food, eats vegetables but has a self reported sweet tooth especially for candy bars.  Drinks mostly water. Cardiac Risk Factors include: advanced age (>68men, >43 women);dyslipidemia;family history of premature cardiovascular disease;male gender;hypertension;obesity (BMI >30kg/m2)  Objective:    Today's Vitals   06/02/15 1507  BP: 122/74  Pulse: 88  Height: 5' 7.5" (1.715 m)  Weight: 212 lb (96.163 kg)  PainSc: 0-No pain   Body mass index is 32.69 kg/(m^2).  A1c = 6.0% (2014)  Activities of Daily Living In your present state of health, do you have any difficulty performing the following activities: 06/02/2015  Hearing? N  Vision? N  Difficulty concentrating or making decisions? N  Walking or climbing stairs? N  Dressing or bathing? N  Doing errands, shopping? N  Preparing Food and eating ? N  Using the Toilet? N  In the past six months, have you accidently leaked urine? N    Do you have problems with loss of bowel control? N  Managing your Medications? N  Managing your Finances? N  Housekeeping or managing your Housekeeping? N     Depression Screen PHQ 2/9 Scores 06/02/2015 04/22/2015 04/06/2015 12/01/2014  PHQ - 2 Score 0 0 0 0     Fall Risk Fall Risk  06/02/2015 04/22/2015 04/06/2015 12/01/2014 05/31/2014  Falls in the past year? No No No No No    Cognitive Function: MMSE - Mini Mental State Exam 06/02/2015  Orientation to time 5  Orientation to Place 5  Registration 3  Attention/ Calculation 5  Recall 3  Language- name 2 objects 2  Language- repeat 1  Language- follow 3 step command 1  Language- read & follow direction 1  Write a sentence 1  Copy design 1  Total score 28    Immunizations and Health Maintenance Immunization History  Administered Date(s) Administered  . Influenza,inj,Quad PF,36+ Mos 12/01/2014  . Influenza-Unspecified 11/12/2012  . Pneumococcal Conjugate-13 06/01/2014  . Pneumococcal Polysaccharide-23 06/02/2015  . Tdap 11/25/2012  . Zoster 05/28/2012   Health Maintenance Due  Topic Date Due  . PNA vac Low Risk Adult (2 of 2 - PPSV23) 06/01/2015    Patient Care Team: Chipper Herb, MD as PCP - General (Family Medicine) Kristeen Miss, MD (Neurosurgery) Juanita Craver, MD (Gastroenterology)  Indicate any recent Medical Services you may have received from other than [facility] providers in the past year (date may be approximate).    Assessment:    Annual Wellness Visit  Pre diabetes Obesity   Screening Tests Health Maintenance  Topic Date Due  . PNA vac Low Risk Adult (2 of 2 - PPSV23) 06/01/2015  . Hepatitis C Screening  07/01/2015 (Originally [birth year])  . COLON CANCER SCREENING ANNUAL FOBT  06/24/2015  . INFLUENZA VACCINE  09/13/2015  . COLONOSCOPY  11/12/2021  . TETANUS/TDAP  11/26/2022  . ZOSTAVAX  Completed        Plan:   During the course of the visit Daniel Macias was educated and counseled about the following  appropriate screening and preventive services:   Vaccines to include Pneumoccal, Influenza,  Td, Zostavax - Patient received Pneumovax23 today.  All other vaccines are UTD  Colorectal cancer screening - UTD  Cardiovascular disease screening - UTD  Diabetes screening - has pre diabetes; last FBG was elevated  Diet - discussed limiting CHO and sugar in diet.  Discussed serving size recommendations.  Discussed healthier snack alternatives.  Glaucoma screening / Eye Exam - UTD  Prostate cancer screening - due with next labs  Advanced Directives - information reviewed and packet given  Physical Activity - increase to 150 minutes weekly   Patient Instructions (the written plan) were given to the patient.   Cherre Robins, [facility]   06/02/2015

## 2015-06-15 ENCOUNTER — Other Ambulatory Visit: Payer: Medicare Other

## 2015-06-15 ENCOUNTER — Ambulatory Visit (INDEPENDENT_AMBULATORY_CARE_PROVIDER_SITE_OTHER): Payer: 59 | Admitting: Cardiology

## 2015-06-15 ENCOUNTER — Encounter: Payer: Self-pay | Admitting: Cardiology

## 2015-06-15 VITALS — BP 120/84 | HR 84 | Ht 68.0 in | Wt 210.0 lb

## 2015-06-15 DIAGNOSIS — R002 Palpitations: Secondary | ICD-10-CM

## 2015-06-15 NOTE — Progress Notes (Signed)
HPI The patient presents for evaluation of palpitations. He has a history of occasional rapid heart rates. I saw him a few years ago for this.  He had another episode on 2/14.  This was the longest episode and it lasted for about 5 hours before he went to the ED.  I reviewed these records. There are complex tachycardia rate 160 to progress spontaneously. Enzymes were normal. He had no other symptoms. He says this is happening maybe 2 or 3 times since the last time I saw him but usually short-lived less than a half hour. He's not able to bring them on. He is very physically active and he gets no other symptoms area he denies chest pressure, neck or arm discomfort. He's had no weight gain or edema. Denies any shortness of breath.   Allergies  Allergen Reactions  . Morphine And Related Itching  . Lisinopril Cough    Current Outpatient Prescriptions  Medication Sig Dispense Refill  . fluticasone (FLONASE) 50 MCG/ACT nasal spray Place 2 sprays into both nostrils daily as needed. 16 g 6  . losartan (COZAAR) 100 MG tablet Take 1 tablet (100 mg total) by mouth daily. 30 tablet 3  . rosuvastatin (CRESTOR) 10 MG tablet Take 1 tablet (10 mg total) by mouth daily. 90 tablet 3  . Vitamin D, Ergocalciferol, (DRISDOL) 50000 UNITS CAPS capsule TAKE 1 CAPSULE EVERY 7 DAYS 12 capsule 6   No current facility-administered medications for this visit.    Past Medical History  Diagnosis Date  . Hyperlipidemia   . Vitamin D deficiency   . Hx of colonic polyps   . Carpal tunnel syndrome, bilateral   . Allergy   . Sleep apnea     CPAP  . Hypertension   . Lower leg fracture     Past Surgical History  Procedure Laterality Date  . Spine surgery  1999    DDD  . Cyst removal hand Right     wrist   . Leg surgery Left 1971    ROS:  Positive for reflux. Otherwise as stated in the history of present illness and negative for all other systems.  PHYSICAL EXAM BP 120/84 mmHg  Pulse 84  Ht 5\' 8"  (1.727 m)   Wt 210 lb (95.255 kg)  BMI 31.94 kg/m2 GENERAL:  Well appearing HEENT:  Pupils equal round and reactive, fundi not visualized, oral mucosa unremarkable NECK:  No jugular venous distention, waveform within normal limits, carotid upstroke brisk and symmetric, no bruits, no thyromegaly LYMPHATICS:  No cervical, inguinal adenopathy LUNGS:  Clear to auscultation bilaterally BACK:  No CVA tenderness CHEST:  Unremarkable HEART:  PMI not displaced or sustained,S1 and S2 within normal limits, no S3, no S4, no clicks, no rubs, no murmurs ABD:  Flat, positive bowel sounds normal in frequency in pitch, no bruits, no rebound, no guarding, no midline pulsatile mass, no hepatomegaly, no splenomegaly EXT:  2 plus pulses throughout, no edema, no cyanosis no clubbing SKIN:  No rashes no nodules NEURO:  Cranial nerves II through XII grossly intact, motor grossly intact throughout PSYCH:  Cognitively intact, oriented to person place and time   ASSESSMENT AND PLAN  SVT:    The patient had SVT.  However, this is still infrequent.  We again went over vagal maneuvers that he could use.  He does not want any meds at this time and I would not suggest the need for this since these are infrequent.  If they start happening with more  frequency he would be a candidate for ablation.   I will check a TSH.  ED records reviewed.

## 2015-06-15 NOTE — Patient Instructions (Signed)
Medication Instructions:  The current medical regimen is effective;  continue present plan and medications.  Labwork: Please have blood work today (TSH) at [facility].  Follow-Up: Follow up as needed with Dr Percival Spanish in [place].  If you need a refill on your cardiac medications before your next appointment, please call your pharmacy.  Thank you for choosing [facility]!!

## 2015-06-16 LAB — TSH: TSH: 4.57 u[IU]/mL — ABNORMAL HIGH (ref 0.450–4.500)

## 2015-06-16 NOTE — Addendum Note (Signed)
Addended by: Jamelle Haring on: 06/16/2015 01:08 PM   Modules accepted: Orders

## 2015-06-19 ENCOUNTER — Other Ambulatory Visit: Payer: Self-pay | Admitting: Family Medicine

## 2015-06-21 ENCOUNTER — Other Ambulatory Visit: Payer: Self-pay | Admitting: Family Medicine

## 2015-06-27 ENCOUNTER — Telehealth: Payer: Self-pay | Admitting: Family Medicine

## 2015-06-27 NOTE — Telephone Encounter (Signed)
Pt aware that this generic is appropriate.

## 2015-07-28 ENCOUNTER — Telehealth: Payer: Self-pay | Admitting: Family Medicine

## 2015-07-28 DIAGNOSIS — W57XXXA Bitten or stung by nonvenomous insect and other nonvenomous arthropods, initial encounter: Secondary | ICD-10-CM

## 2015-07-28 MED ORDER — DOXYCYCLINE HYCLATE 100 MG PO TABS: 100.0000 mg | ORAL_TABLET | Freq: Two times a day (BID) | ORAL | Status: DC

## 2015-07-28 NOTE — Telephone Encounter (Signed)
Pt will come in for labs - but will go ahead and start on DOXY for 3 weeks Per [facility]  - orders placed

## 2015-07-29 ENCOUNTER — Other Ambulatory Visit: Payer: Medicare Other

## 2015-07-29 DIAGNOSIS — W57XXXA Bitten or stung by nonvenomous insect and other nonvenomous arthropods, initial encounter: Secondary | ICD-10-CM

## 2015-08-02 ENCOUNTER — Telehealth: Payer: Self-pay | Admitting: *Deleted

## 2015-08-02 LAB — LYME AB/WESTERN BLOT REFLEX: Lyme IgG/IgM Ab: <0.91 {ISR} (ref 0.00–0.90)

## 2015-08-02 LAB — ROCKY MTN SPOTTED FVR ABS PNL(IGG+IGM)
RMSF IgG: NEGATIVE
RMSF IgM: 0.39 index (ref 0.00–0.89)

## 2015-08-02 NOTE — Telephone Encounter (Signed)
Pt notified of results

## 2015-08-08 ENCOUNTER — Other Ambulatory Visit: Payer: Self-pay | Admitting: Family Medicine

## 2015-08-30 ENCOUNTER — Ambulatory Visit (INDEPENDENT_AMBULATORY_CARE_PROVIDER_SITE_OTHER): Payer: Medicare Other | Admitting: Family Medicine

## 2015-08-30 ENCOUNTER — Other Ambulatory Visit: Payer: Self-pay | Admitting: *Deleted

## 2015-08-30 ENCOUNTER — Encounter: Payer: Self-pay | Admitting: Family Medicine

## 2015-08-30 VITALS — BP 98/64 | HR 75 | Temp 97.4°F | Ht 68.0 in | Wt 208.0 lb

## 2015-08-30 DIAGNOSIS — R8290 Unspecified abnormal findings in urine: Secondary | ICD-10-CM

## 2015-08-30 DIAGNOSIS — Z0000 Encounter for general adult medical examination without abnormal findings: Secondary | ICD-10-CM

## 2015-08-30 DIAGNOSIS — E785 Hyperlipidemia, unspecified: Secondary | ICD-10-CM

## 2015-08-30 DIAGNOSIS — E559 Vitamin D deficiency, unspecified: Secondary | ICD-10-CM

## 2015-08-30 DIAGNOSIS — R002 Palpitations: Secondary | ICD-10-CM

## 2015-08-30 DIAGNOSIS — N400 Enlarged prostate without lower urinary tract symptoms: Secondary | ICD-10-CM

## 2015-08-30 DIAGNOSIS — I10 Essential (primary) hypertension: Secondary | ICD-10-CM

## 2015-08-30 DIAGNOSIS — R1031 Right lower quadrant pain: Secondary | ICD-10-CM

## 2015-08-30 LAB — URINALYSIS, COMPLETE
Bilirubin, UA: NEGATIVE
GLUCOSE, UA: NEGATIVE
KETONES UA: NEGATIVE
Leukocytes, UA: NEGATIVE
NITRITE UA: NEGATIVE
Protein, UA: NEGATIVE
SPEC GRAV UA: 1.025 (ref 1.005–1.030)
UUROB: 0.2 mg/dL (ref 0.2–1.0)
pH, UA: 5 (ref 5.0–7.5)

## 2015-08-30 LAB — MICROSCOPIC EXAMINATION: Bacteria, UA: NONE SEEN

## 2015-08-30 NOTE — Patient Instructions (Addendum)
Medicare Annual Wellness Visit  [facility] and the medical providers at [facility] strive to bring you the best medical care.  In doing so we not only want to address your current medical conditions and concerns but also to detect new conditions early and prevent illness, disease and health-related problems.    Medicare offers a yearly Wellness Visit which allows our clinical staff to assess your need for preventative services including immunizations, lifestyle education, counseling to decrease risk of preventable diseases and screening for fall risk and other medical concerns.    This visit is provided free of charge (no copay) for all Medicare recipients. The clinical pharmacists at [facility] have begun to conduct these Wellness Visits which will also include a thorough review of all your medications.    As you primary medical provider recommend that you make an appointment for your Annual Wellness Visit if you have not done so already this year.  You may set up this appointment before you leave today or you may call back [phone]) and schedule an appointment.  Please make sure when you call that you mention that you are scheduling your Annual Wellness Visit with the clinical pharmacist so that the appointment may be made for the proper length of time.     Continue current medications. Continue good therapeutic lifestyle changes which include good diet and exercise. Fall precautions discussed with patient. If an FOBT was given today- please return it to our front desk. If you are over 86 years old - you may need Prevnar 43 or the adult Pneumonia vaccine.  **Flu shots are available--- please call and schedule a FLU-CLINIC appointment**  After your visit with [place] today you will receive a survey in the mail or online from [employer] regarding your care with [place]. Please take a moment to fill this out. Your feedback is very  important to [place] as you can help [place] better understand your patient needs as well as improve your experience and satisfaction. WE CARE ABOUT YOU!!!   We will call with lab work results as soon as these results become available If the inguinal discomfort continues after taking ibuprofen or Aleve twice daily for 7-10 days, please call back and let us know this as we can do further studies to evaluate this. Stay active physically and try to avoid any heavy lifting pushing straining for the next 7-10 days.

## 2015-08-30 NOTE — Progress Notes (Signed)
Subjective:    Patient ID: Daniel Macias, male    DOB: 04-25-1950, 65 y.o.   MRN: [id number]  HPI Patient is here today for annual wellness exam and follow up of chronic medical problems which includes hypertension and hyperlipidemia. He is taking medications regularly. The patient's main complaint is some right lower quadrant pain that is worse laying down and at bedtime. He thinks this could be a small hernia he is not sure he has not seen any bulging or anything and it is sore in nature. He says he does a lot of lifting all the time but does not recall any specific incident where this started hurting him. He is been going on for about a week. He is due to get lab work today and a urinalysis. He had an EKG in February of this year. His colonoscopy was done in November 2013 according to the record his next one is not due until 2023. The patient denies any chest pain or pressure. He has had only one episode of rapid heartbeat and he did a Valsalva maneuver and this took care of it. He is headache and workup by the cardiologist. He denies any shortness of breath. He denies any problems with swallowing. He does have occasional heartburn and this is related to what he has eaten. It is not severe. He denies any blood in the stool black tarry bowel movements or change in bowel habits. His colonoscopy as mentioned is not due until 2023. He's passing his water without problems no burning pain or frequency. He has had some problems with his ankles and he was concerned that this was tick bite related his ankles are now doing better. He did take a course of doxycycline. The tests for [facility] spotted fever and Lyme disease were both negative. He is not sexually active. He has no problems with erectile dysfunction.     Patient Active Problem List   Diagnosis Date Noted  . Pre-diabetes 06/02/2015  . Palpitation 09/30/2013  . Allergic rhinitis 05/27/2013  . Hypertension 05/27/2013  . Metabolic syndrome  17/40/8144  . BPH (benign prostatic hyperplasia) 11/25/2012  . Obstructive sleep apnea 11/25/2012  . Hyperlipemia 05/28/2012  . Vitamin D deficiency disease 05/28/2012   Outpatient Encounter Prescriptions as of 08/30/2015  Medication Sig  . CRESTOR 10 MG tablet TAKE 1 TABLET (10 MG TOTAL) BY MOUTH DAILY.  . fluticasone (FLONASE) 50 MCG/ACT nasal spray Place 2 sprays into both nostrils daily as needed.  Marland Kitchen losartan (COZAAR) 100 MG tablet TAKE 1 TABLET (100 MG TOTAL) BY MOUTH DAILY.  Marland Kitchen Vitamin D, Ergocalciferol, (DRISDOL) 50000 units CAPS capsule TAKE 1 CAPSULE EVERY 7 DAYS  . [DISCONTINUED] doxycycline (VIBRA-TABS) 100 MG tablet Take 1 tablet (100 mg total) by mouth 2 (two) times daily.   No facility-administered encounter medications on file as of 08/30/2015.      Review of Systems  Constitutional: Negative.   HENT: Negative.   Eyes: Negative.   Respiratory: Negative.   Cardiovascular: Negative.   Gastrointestinal: Positive for abdominal pain (RLQ pain - worse at bedtime).  Endocrine: Negative.   Genitourinary: Negative.   Musculoskeletal: Negative.   Skin: Negative.   Allergic/Immunologic: Negative.   Neurological: Negative.   Hematological: Negative.   Psychiatric/Behavioral: Negative.        Objective:   Physical Exam  Constitutional: He is oriented to person, place, and time. He appears well-developed and well-nourished. No distress.  The patient is alert and pleasant  HENT:  Head:  Normocephalic and atraumatic.  Right Ear: External ear normal.  Left Ear: External ear normal.  Nose: Nose normal.  Mouth/Throat: Oropharynx is clear and moist. No oropharyngeal exudate.  Eyes: Conjunctivae and EOM are normal. Pupils are equal, round, and reactive to light. Right eye exhibits no discharge. Left eye exhibits no discharge. No scleral icterus.  He gets his eyes checked regularly  Neck: Normal range of motion. Neck supple. No thyromegaly present.  Cardiovascular: Normal rate,  regular rhythm, normal heart sounds and intact distal pulses.   No murmur heard. The rhythm is regular at 72/m  Pulmonary/Chest: Effort normal and breath sounds normal. No respiratory distress. He has no wheezes. He has no rales. He exhibits no tenderness.  Chest is clear anteriorly and posteriorly and no axillary adenopathy  Abdominal: Soft. Bowel sounds are normal. He exhibits no mass. There is no tenderness. There is no rebound and no guarding.  No epigastric or abdominal tenderness. No liver or spleen enlargement. There is no inguinal adenopathy.  Genitourinary: Rectum normal and penis normal.  The prostate is slightly enlarged and smooth. There are no rectal masses. The external genitalia were within normal limits. No abdominal masses or bulges present. There is no inguinal hernia palpable. There is no inguinal nodes.  Musculoskeletal: Normal range of motion. He exhibits no edema or tenderness.  Lymphadenopathy:    He has no cervical adenopathy.  Neurological: He is alert and oriented to person, place, and time. He has normal reflexes. No cranial nerve deficit.  Skin: Skin is warm and dry. No rash noted.  Psychiatric: He has a normal mood and affect. His behavior is normal. Judgment and thought content normal.  Nursing note and vitals reviewed.  BP 98/64 mmHg  Pulse 75  Temp(Src) 97.4 F (36.3 C) (Oral)  Ht '5\' 8"'  (1.727 m)  Wt 208 lb (94.348 kg)  BMI 31.63 kg/m2        Assessment & Plan:  1. Annual physical exam -The patient is up-to-date on his colonoscopy chest x-ray and EKG. - BMP8+EGFR - CBC with Differential/Platelet - Hepatic function panel - NMR, lipoprofile - PSA, total and free - VITAMIN D 25 Hydroxy (Vit-D Deficiency, Fractures) - Urinalysis, Complete  2. Vitamin D deficiency disease -Continue current treatment pending results of lab work - CBC with Differential/Platelet - VITAMIN D 25 Hydroxy (Vit-D Deficiency, Fractures)  3. Hyperlipemia -Continue  aggressive therapeutic lifestyle changes as well as current treatment pending results of lab work - CBC with Differential/Platelet - NMR, lipoprofile  4. Essential hypertension -Monitor blood pressures closely and if they continue to run low or you having lightheadedness we may need to the medicines slightly. - BMP8+EGFR - CBC with Differential/Platelet - Hepatic function panel  5. BPH (benign prostatic hyperplasia) -The patient is having no problems with passing his water burning pain or frequency. - CBC with Differential/Platelet - PSA, total and free - Urinalysis, Complete  6. Right inguinal pain -This is most likely an inguinal strain and not an inguinal hernia is no bulges or hernias were palpable.  7. Palpitations -The patient has seen the cardiologist about this in the past and has had only one episode of palpitations and these resolved with Valsalva maneuver as requested by the cardiologist for him to do.  Patient Instructions                       Medicare Annual Wellness Visit  [facility] and the medical providers at [facility] strive  to bring you the best medical care.  In doing so we not only want to address your current medical conditions and concerns but also to detect new conditions early and prevent illness, disease and health-related problems.    Medicare offers a yearly Wellness Visit which allows our clinical staff to assess your need for preventative services including immunizations, lifestyle education, counseling to decrease risk of preventable diseases and screening for fall risk and other medical concerns.    This visit is provided free of charge (no copay) for all Medicare recipients. The clinical pharmacists at [facility] have begun to conduct these Wellness Visits which will also include a thorough review of all your medications.    As you primary medical provider recommend that you make an appointment for your  Annual Wellness Visit if you have not done so already this year.  You may set up this appointment before you leave today or you may call back ([phone]) and schedule an appointment.  Please make sure when you call that you mention that you are scheduling your Annual Wellness Visit with the clinical pharmacist so that the appointment may be made for the proper length of time.     Continue current medications. Continue good therapeutic lifestyle changes which include good diet and exercise. Fall precautions discussed with patient. If an FOBT was given today- please return it to our front desk. If you are over 35 years old - you may need Prevnar 33 or the adult Pneumonia vaccine.  **Flu shots are available--- please call and schedule a FLU-CLINIC appointment**  After your visit with [place] today you will receive a survey in the mail or online from [employer] regarding your care with [place]. Please take a moment to fill this out. Your feedback is very important to [place] as you can help [place] better understand your patient needs as well as improve your experience and satisfaction. WE CARE ABOUT YOU!!!   We will call with lab work results as soon as these results become available If the inguinal discomfort continues after taking ibuprofen or Aleve twice daily for 7-10 days, please call back and let us know this as we can do further studies to evaluate this. Stay active physically and try to avoid any heavy lifting pushing straining for the next 7-10 days.     Arrie Senate MD

## 2015-08-31 LAB — HEPATIC FUNCTION PANEL
ALBUMIN: 4.5 g/dL (ref 3.6–4.8)
ALK PHOS: 71 IU/L (ref 39–117)
ALT: 24 IU/L (ref 0–44)
AST: 24 IU/L (ref 0–40)
BILIRUBIN TOTAL: 0.8 mg/dL (ref 0.0–1.2)
Bilirubin, Direct: 0.25 mg/dL (ref 0.00–0.40)
TOTAL PROTEIN: 7.8 g/dL (ref 6.0–8.5)

## 2015-08-31 LAB — NMR, LIPOPROFILE
CHOLESTEROL: 139 mg/dL (ref 100–199)
HDL CHOLESTEROL BY NMR: 59 mg/dL (ref >39)
HDL PARTICLE NUMBER: 42.6 umol/L (ref >=30.5)
LDL Particle Number: 741 nmol/L (ref <1000)
LDL Size: 20.8 nm (ref >20.5)
LDL-C: 65 mg/dL (ref 0–99)
LP-IR Score: 44 (ref <=45)
Small LDL Particle Number: 315 nmol/L (ref <=527)
Triglycerides by NMR: 73 mg/dL (ref 0–149)

## 2015-08-31 LAB — BMP8+EGFR
BUN / CREAT RATIO: 20 (ref 10–24)
BUN: 17 mg/dL (ref 8–27)
CALCIUM: 9.5 mg/dL (ref 8.6–10.2)
CHLORIDE: 97 mmol/L (ref 96–106)
CO2: 25 mmol/L (ref 18–29)
CREATININE: 0.84 mg/dL (ref 0.76–1.27)
GFR calc Af Amer: 105 mL/min/{1.73_m2} (ref >59)
GFR calc non Af Amer: 91 mL/min/{1.73_m2} (ref >59)
GLUCOSE: 104 mg/dL — AB (ref 65–99)
Potassium: 4.6 mmol/L (ref 3.5–5.2)
Sodium: 136 mmol/L (ref 134–144)

## 2015-08-31 LAB — CBC WITH DIFFERENTIAL/PLATELET
BASOS ABS: 0 10*3/uL (ref 0.0–0.2)
Basos: 0 %
EOS (ABSOLUTE): 0.1 10*3/uL (ref 0.0–0.4)
Eos: 2 %
HEMOGLOBIN: 15.4 g/dL (ref 12.6–17.7)
Hematocrit: 44.3 % (ref 37.5–51.0)
IMMATURE GRANULOCYTES: 0 %
Immature Grans (Abs): 0 10*3/uL (ref 0.0–0.1)
LYMPHS ABS: 2 10*3/uL (ref 0.7–3.1)
Lymphs: 32 %
MCH: 30.1 pg (ref 26.6–33.0)
MCHC: 34.8 g/dL (ref 31.5–35.7)
MCV: 87 fL (ref 79–97)
MONOCYTES: 7 %
Monocytes Absolute: 0.4 10*3/uL (ref 0.1–0.9)
NEUTROS PCT: 59 %
Neutrophils Absolute: 3.6 10*3/uL (ref 1.4–7.0)
Platelets: 249 10*3/uL (ref 150–379)
RBC: 5.12 x10E6/uL (ref 4.14–5.80)
RDW: 13.2 % (ref 12.3–15.4)
WBC: 6.1 10*3/uL (ref 3.4–10.8)

## 2015-08-31 LAB — PSA, TOTAL AND FREE
PSA, Free Pct: 44.3 %
PSA, Free: 0.31 ng/mL
Prostate Specific Ag, Serum: 0.7 ng/mL (ref 0.0–4.0)

## 2015-08-31 LAB — VITAMIN D 25 HYDROXY (VIT D DEFICIENCY, FRACTURES): VIT D 25 HYDROXY: 39.1 ng/mL (ref 30.0–100.0)

## 2015-09-02 ENCOUNTER — Ambulatory Visit (INDEPENDENT_AMBULATORY_CARE_PROVIDER_SITE_OTHER): Payer: Medicare Other | Admitting: Nurse Practitioner

## 2015-09-02 ENCOUNTER — Encounter: Payer: Medicare Other | Admitting: Family Medicine

## 2015-09-02 ENCOUNTER — Encounter: Payer: Self-pay | Admitting: Nurse Practitioner

## 2015-09-02 VITALS — BP 126/77 | HR 87 | Temp 97.2°F | Ht 68.0 in | Wt 210.0 lb

## 2015-09-02 DIAGNOSIS — L500 Allergic urticaria: Secondary | ICD-10-CM

## 2015-09-02 MED ORDER — METHYLPREDNISOLONE ACETATE 80 MG/ML IJ SUSP
80.0000 mg | Freq: Once | INTRAMUSCULAR | Status: AC
  Administered 2015-09-02: 80 mg via INTRAMUSCULAR

## 2015-09-02 NOTE — Progress Notes (Signed)
   Subjective:    Patient ID: Daniel Macias, male    DOB: 1950/08/17, 65 y.o.   MRN: [id number]  HPI Patient in today c/o rash- started Wednesday night. Rash actually started on his feet and then went all over. No new foods or beverages. No new soaps or detergents. Has gotten over 17 ticks off of hisself this summer.    Review of Systems  Constitutional: Negative.   HENT: Negative.   Respiratory: Negative.   Cardiovascular: Negative.   Genitourinary: Negative.   Musculoskeletal: Negative.   Neurological: Negative.   Psychiatric/Behavioral: Negative.   All other systems reviewed and are negative.       Objective:   Physical Exam  Constitutional: He is oriented to person, place, and time. He appears well-nourished. No distress.  Cardiovascular: Normal rate, regular rhythm and normal heart sounds.   Pulmonary/Chest: Effort normal and breath sounds normal.  Neurological: He is alert and oriented to person, place, and time.  Skin: Skin is warm.  Psychiatric: He has a normal mood and affect. His behavior is normal. Judgment and thought content normal.    BP 126/77 mmHg  Pulse 87  Temp(Src) 97.2 F (36.2 C) (Oral)  Ht 5\' 8"  (1.727 m)  Wt 210 lb (95.255 kg)  BMI 31.94 kg/m2       Assessment & Plan:   1. Allergic urticaria    Meds ordered this encounter  Medications  . methylPREDNISolone acetate (DEPO-MEDROL) injection 80 mg    Sig:    Avoid scratching Benadryl OTC keep food diary RTO prn  Mary-Margaret Hassell Done, FNP

## 2015-09-02 NOTE — Patient Instructions (Signed)
Hives Hives are itchy, red, swollen areas of the skin. They can vary in size and location on your body. Hives can come and go for hours or several days (acute hives) or for several weeks (chronic hives). Hives do not spread from person to person (noncontagious). They may get worse with scratching, exercise, and emotional stress. CAUSES   Allergic reaction to food, additives, or drugs.  Infections, including the common cold.  Illness, such as vasculitis, lupus, or thyroid disease.  Exposure to sunlight, heat, or cold.  Exercise.  Stress.  Contact with chemicals. SYMPTOMS   Red or white swollen patches on the skin. The patches may change size, shape, and location quickly and repeatedly.  Itching.  Swelling of the hands, feet, and face. This may occur if hives develop deeper in the skin. DIAGNOSIS  Your caregiver can usually tell what is wrong by performing a physical exam. Skin or blood tests may also be done to determine the cause of your hives. In some cases, the cause cannot be determined. TREATMENT  Mild cases usually get better with medicines such as antihistamines. Severe cases may require an emergency epinephrine injection. If the cause of your hives is known, treatment includes avoiding that trigger.  HOME CARE INSTRUCTIONS   Avoid causes that trigger your hives.  Take antihistamines as directed by your caregiver to reduce the severity of your hives. Non-sedating or low-sedating antihistamines are usually recommended. Do not drive while taking an antihistamine.  Take any other medicines prescribed for itching as directed by your caregiver.  Wear loose-fitting clothing.  Keep all follow-up appointments as directed by your caregiver. SEEK MEDICAL CARE IF:   You have persistent or severe itching that is not relieved with medicine.  You have painful or swollen joints. SEEK IMMEDIATE MEDICAL CARE IF:   You have a fever.  Your tongue or lips are swollen.  You have  trouble breathing or swallowing.  You feel tightness in the throat or chest.  You have abdominal pain. These problems may be the first sign of a life-threatening allergic reaction. Call your local emergency services (911 in [place] MAKE SURE YOU:   Understand these instructions.  Will watch your condition.  Will get help right away if you are not doing well or get worse.   This information is not intended to replace advice given to you by your health care provider. Make sure you discuss any questions you have with your health care provider.   Document Released: 01/29/2005 Document Revised: 02/03/2013 Document Reviewed: 04/24/2011 Elsevier Interactive Patient Education 2016 Elsevier Inc.  

## 2015-09-05 ENCOUNTER — Other Ambulatory Visit: Payer: Self-pay | Admitting: Family Medicine

## 2015-09-05 DIAGNOSIS — W57XXXA Bitten or stung by nonvenomous insect and other nonvenomous arthropods, initial encounter: Secondary | ICD-10-CM

## 2015-09-12 ENCOUNTER — Encounter: Payer: Self-pay | Admitting: *Deleted

## 2015-09-17 ENCOUNTER — Other Ambulatory Visit: Payer: Medicare Other

## 2015-09-17 DIAGNOSIS — Z1211 Encounter for screening for malignant neoplasm of colon: Secondary | ICD-10-CM

## 2015-09-21 LAB — FECAL OCCULT BLOOD, IMMUNOCHEMICAL: FECAL OCCULT BLD: NEGATIVE

## 2015-11-16 ENCOUNTER — Other Ambulatory Visit: Payer: Self-pay | Admitting: Family Medicine

## 2015-12-12 ENCOUNTER — Other Ambulatory Visit: Payer: Self-pay | Admitting: Family Medicine

## 2016-02-19 ENCOUNTER — Other Ambulatory Visit: Payer: Self-pay | Admitting: Nurse Practitioner

## 2016-02-21 ENCOUNTER — Telehealth: Payer: Self-pay | Admitting: Pulmonary Disease

## 2016-02-21 NOTE — Telephone Encounter (Signed)
Pt requesting to switch dme companies for cpap.  I advised pt that he has not been seen since 2015 and that we would need to see him in clinic before any orders could be placed for this.  Pt scheduled 03/08/16 with [facility], stressed the importance of keeping this office visit.  Pt expressed understanding.  Nothing further needed at this time.

## 2016-02-29 ENCOUNTER — Ambulatory Visit: Payer: Medicare Other | Admitting: Family Medicine

## 2016-03-08 ENCOUNTER — Ambulatory Visit: Payer: Medicare Other | Admitting: Pulmonary Disease

## 2016-03-08 ENCOUNTER — Ambulatory Visit (INDEPENDENT_AMBULATORY_CARE_PROVIDER_SITE_OTHER): Payer: Medicare Other | Admitting: Pulmonary Disease

## 2016-03-08 ENCOUNTER — Encounter: Payer: Self-pay | Admitting: Pulmonary Disease

## 2016-03-08 VITALS — BP 120/80 | HR 76 | Ht 68.0 in | Wt 212.4 lb

## 2016-03-08 DIAGNOSIS — Z9989 Dependence on other enabling machines and devices: Secondary | ICD-10-CM

## 2016-03-08 DIAGNOSIS — G4733 Obstructive sleep apnea (adult) (pediatric): Secondary | ICD-10-CM

## 2016-03-08 NOTE — Patient Instructions (Signed)
Will arrange to have [facility] supplies your CPAP equipment  Follow up in 1 year

## 2016-03-08 NOTE — Progress Notes (Signed)
Current Outpatient Prescriptions on File Prior to Visit  Medication Sig  . fluticasone (FLONASE) 50 MCG/ACT nasal spray Place 2 sprays into both nostrils daily as needed.  Marland Kitchen losartan (COZAAR) 100 MG tablet TAKE 1 TABLET (100 MG TOTAL) BY MOUTH DAILY.  . rosuvastatin (CRESTOR) 10 MG tablet TAKE 1 TABLET (10 MG TOTAL) BY MOUTH DAILY.  Marland Kitchen Vitamin D, Ergocalciferol, (DRISDOL) 50000 units CAPS capsule TAKE 1 CAPSULE EVERY 7 DAYS   No current facility-administered medications on file prior to visit.     Chief Complaint  Patient presents with  . Follow-up    Wears CPAP nightly. Denies problems with pressure. Mask seems to be causing tenderness to the his bridge of nose. Pt wants to discuss change in mask/headgear. Pt also wants to change DME from [facility] [place] to [facility].     Sleep tests PSG 06/07/12 >> AHI 13, SaO2 74% Auto CPAP 12/09/15 to 03/07/16 >> used on 90 of 90 nights with average 6 hrs 13 min.  Average AHI 1 with median CPAP 11 and 95 th percentile CPAP 14 cm H2O  Past medical history HTN, HLD, Vit D deficiency  Past surgical history, Family history, Social history, Allergies reviewed  Vital signs BP 120/80 (BP Location: Left Arm, Cuff Size: Normal)   Pulse 76   Ht 5\' 8"  (1.727 m)   Wt 212 lb 6.4 oz (96.3 kg)   SpO2 95%   BMI 32.30 kg/m    History of Present Illness: Daniel Macias is a 65 y.o. male with OSA.  He uses CPAP nightly.  He has full face mask.  He gets pressure from his mask between his eyes.  He is frustrated in dealing with [facility] and wants to change to [facility].  Physical Exam:  General - pleasant ENT - no sinus tenderness, MP 3, extensive dental work Cardiac - regular, no murmur Chest - no wheeze/rales Back - no tenderness Abd - soft, non tender Ext - no edema Neuro - normal strength Skin - no rashes Psych - normal mood   Assessment/Plan:  Obstructive sleep apnea. - he is compliant with CPAP and reports benefit - continue Auto CPAP - will switch DME's to  [facility] and get mask refitted - he might be eligible for new CPAP in 2019   Patient Instructions  Will arrange to have [facility] supplies your CPAP equipment  Follow up in 1 year   Chesley Mires, MD [place] Pager:  [phone] 03/08/2016, 11:02 AM

## 2016-03-09 ENCOUNTER — Ambulatory Visit (INDEPENDENT_AMBULATORY_CARE_PROVIDER_SITE_OTHER): Payer: Medicare Other | Admitting: Family Medicine

## 2016-03-09 ENCOUNTER — Encounter: Payer: Self-pay | Admitting: Family Medicine

## 2016-03-09 ENCOUNTER — Ambulatory Visit (INDEPENDENT_AMBULATORY_CARE_PROVIDER_SITE_OTHER): Payer: Medicare Other

## 2016-03-09 VITALS — BP 109/66 | HR 75 | Temp 97.4°F | Ht 68.0 in | Wt 211.0 lb

## 2016-03-09 DIAGNOSIS — E7800 Pure hypercholesterolemia, unspecified: Secondary | ICD-10-CM

## 2016-03-09 DIAGNOSIS — N400 Enlarged prostate without lower urinary tract symptoms: Secondary | ICD-10-CM | POA: Diagnosis not present

## 2016-03-09 DIAGNOSIS — M79645 Pain in left finger(s): Secondary | ICD-10-CM

## 2016-03-09 DIAGNOSIS — I10 Essential (primary) hypertension: Secondary | ICD-10-CM

## 2016-03-09 DIAGNOSIS — E559 Vitamin D deficiency, unspecified: Secondary | ICD-10-CM

## 2016-03-09 DIAGNOSIS — M4722 Other spondylosis with radiculopathy, cervical region: Secondary | ICD-10-CM

## 2016-03-09 DIAGNOSIS — K30 Functional dyspepsia: Secondary | ICD-10-CM

## 2016-03-09 NOTE — Patient Instructions (Addendum)
Medicare Annual Wellness Visit  [facility] and the medical providers at [facility] strive to bring you the best medical care.  In doing so we not only want to address your current medical conditions and concerns but also to detect new conditions early and prevent illness, disease and health-related problems.    Medicare offers a yearly Wellness Visit which allows our clinical staff to assess your need for preventative services including immunizations, lifestyle education, counseling to decrease risk of preventable diseases and screening for fall risk and other medical concerns.    This visit is provided free of charge (no copay) for all Medicare recipients. The clinical pharmacists at [facility] have begun to conduct these Wellness Visits which will also include a thorough review of all your medications.    As you primary medical provider recommend that you make an appointment for your Annual Wellness Visit if you have not done so already this year.  You may set up this appointment before you leave today or you may call back [phone] and schedule an appointment.  Please make sure when you call that you mention that you are scheduling your Annual Wellness Visit with the clinical pharmacist so that the appointment may be made for the proper length of time.      Continue current medications. Continue good therapeutic lifestyle changes which include good diet and exercise. Fall precautions discussed with patient. If an FOBT was given today- please return it to our front desk. If you are over 8 years old - you may need Prevnar 50 or the adult Pneumonia vaccine.  **Flu shots are available--- please call and schedule a FLU-CLINIC appointment**  After your visit with [place] today you will receive a survey in the mail or online from [employer] regarding your care with [place]. Please take a moment to fill this out. Your feedback is very  important to [place] as you can help [place] better understand your patient needs as well as improve your experience and satisfaction. WE CARE ABOUT YOU!!!  Continue to practice good pulmonary hygiene and hand hygiene Use cool mist humidification keep the house as cool as possible Do not use any overhead fans For the heartburn you can purchase ranitidine 150 mg, the equate brand and take one twice a day before breakfast and supper. Do this for 4 weeks and then take it as needed prior to a indigestion producing meals like spicy foods etc. We will call with results of the x-rays of the finger as soon as they become available If you continue to have problems with her arms from your neck he should call and make an appointment for follow-up with the neurosurgeon

## 2016-03-09 NOTE — Progress Notes (Signed)
Subjective:    Patient ID: Daniel Macias, male    DOB: 21-Sep-1950, 66 y.o.   MRN: [id number]  HPI Pt here for follow up and management of chronic medical problems which includes hypertension and hyperlipidemia. He is taking medication regularly.The patient is doing well today and only complains of some left ring finger pain. He is on CPAP for sleep apnea. The patient denies any chest pain or shortness of breath. He has no trouble with swallowing other than some may be in increased heartburn and indigestion. His bowels are moving regularly and no change in bowel habits. There is no blood in the stool or black tarry bowel movements. He had his last colonoscopy in 2013 says the Nexium is not due for 10 years later. He's passing his water without problems. He is using his CPAP machine but doesn't like it because it is so uncomfortable. He continues to use it though. The patient continues to have trouble with his left ring finger from an injury with a lead rope.    Patient Active Problem List   Diagnosis Date Noted  . Pre-diabetes 06/02/2015  . Palpitation 09/30/2013  . Allergic rhinitis 05/27/2013  . Hypertension 05/27/2013  . Metabolic syndrome 03/25/1550  . BPH (benign prostatic hyperplasia) 11/25/2012  . Obstructive sleep apnea 11/25/2012  . Hyperlipemia 05/28/2012  . Vitamin D deficiency disease 05/28/2012   Outpatient Encounter Prescriptions as of 03/09/2016  Medication Sig  . fluticasone (FLONASE) 50 MCG/ACT nasal spray Place 2 sprays into both nostrils daily as needed.  Marland Kitchen losartan (COZAAR) 100 MG tablet TAKE 1 TABLET (100 MG TOTAL) BY MOUTH DAILY.  . rosuvastatin (CRESTOR) 10 MG tablet TAKE 1 TABLET (10 MG TOTAL) BY MOUTH DAILY.  Marland Kitchen Vitamin D, Ergocalciferol, (DRISDOL) 50000 units CAPS capsule TAKE 1 CAPSULE EVERY 7 DAYS   No facility-administered encounter medications on file as of 03/09/2016.       Review of Systems  Constitutional: Negative.   HENT: Negative.   Eyes:  Negative.   Respiratory: Negative.   Cardiovascular: Negative.   Gastrointestinal: Negative.   Endocrine: Negative.   Genitourinary: Negative.   Musculoskeletal: Positive for arthralgias (left ring finger pain).  Skin: Negative.   Allergic/Immunologic: Negative.   Neurological: Negative.   Hematological: Negative.   Psychiatric/Behavioral: Negative.        Objective:   Physical Exam  Constitutional: He is oriented to person, place, and time. He appears well-developed and well-nourished. No distress.  The patient is pleasant and alert and appears to be in good health.  HENT:  Head: Normocephalic and atraumatic.  Right Ear: External ear normal.  Left Ear: External ear normal.  Nose: Nose normal.  Mouth/Throat: Oropharynx is clear and moist. No oropharyngeal exudate.  Eyes: Conjunctivae and EOM are normal. Pupils are equal, round, and reactive to light. Right eye exhibits no discharge. Left eye exhibits no discharge. No scleral icterus.  Neck: Normal range of motion. Neck supple. No thyromegaly present.  No bruits thyromegaly or anterior cervical adenopathy  Cardiovascular: Normal rate, regular rhythm, normal heart sounds and intact distal pulses.   No murmur heard. Heart has a regular rate and rhythm at 72/m  Pulmonary/Chest: Effort normal and breath sounds normal. No respiratory distress. He has no wheezes. He has no rales. He exhibits no tenderness.  Clear anteriorly and posteriorly and no axillary adenopathy  Abdominal: Soft. Bowel sounds are normal. He exhibits no mass. There is no tenderness. There is no rebound and no guarding.  No liver or  spleen enlargement no masses and no bruits  Musculoskeletal: Normal range of motion. He exhibits tenderness. He exhibits no edema.  The patient has some swelling around the PIP joint of the left ring finger. He is able to flex it and it looks like he can fully extend it. We will have him get an x-ray and wear a splint for a few days until  this is fully recovered.  Lymphadenopathy:    He has no cervical adenopathy.  Neurological: He is alert and oriented to person, place, and time. He has normal reflexes. No cranial nerve deficit.  Skin: Skin is warm and dry. No rash noted.  Psychiatric: He has a normal mood and affect. His behavior is normal. Judgment and thought content normal.  Nursing note and vitals reviewed.   BP 109/66 (BP Location: Left Arm)   Pulse 75   Temp 97.4 F (36.3 C) (Oral)   Ht '5\' 8"'  (1.727 m)   Wt 211 lb (95.7 kg)   BMI 32.08 kg/m        Assessment & Plan:  1. Vitamin D deficiency disease -Continue current treatment pending results of lab work - CBC with Differential/Platelet - VITAMIN D 25 Hydroxy (Vit-D Deficiency, Fractures)  2. Pure hypercholesterolemia -Continue with aggressive therapeutic lifestyle changes and current Crestor any results of lab work - CBC with Differential/Platelet - NMR, lipoprofile  3. Essential hypertension -The blood pressure is good today and he should continue with current treatment - BMP8+EGFR - CBC with Differential/Platelet - Hepatic function panel  4. Benign prostatic hyperplasia, unspecified whether lower urinary tract symptoms present -No complaints with voiding or symptoms with BPH today. - CBC with Differential/Platelet  5. Finger pain, left -Split finger for protection until swelling resolves - DG Finger Ring Left; Future  6. Osteoarthritis of spine with radiculopathy, cervical region -Follow-up with neurosurgery  7. Indigestion -Take ranitidine 150 mg twice daily for 1 month before breakfast and supper and then as needed  Patient Instructions                       Medicare Annual Wellness Visit  [facility] and the medical providers at [facility] strive to bring you the best medical care.  In doing so we not only want to address your current medical conditions and concerns but also to detect new conditions early and  prevent illness, disease and health-related problems.    Medicare offers a yearly Wellness Visit which allows our clinical staff to assess your need for preventative services including immunizations, lifestyle education, counseling to decrease risk of preventable diseases and screening for fall risk and other medical concerns.    This visit is provided free of charge (no copay) for all Medicare recipients. The clinical pharmacists at [facility] have begun to conduct these Wellness Visits which will also include a thorough review of all your medications.    As you primary medical provider recommend that you make an appointment for your Annual Wellness Visit if you have not done so already this year.  You may set up this appointment before you leave today or you may call back ([phone]) and schedule an appointment.  Please make sure when you call that you mention that you are scheduling your Annual Wellness Visit with the clinical pharmacist so that the appointment may be made for the proper length of time.      Continue current medications. Continue good therapeutic lifestyle changes which include good  diet and exercise. Fall precautions discussed with patient. If an FOBT was given today- please return it to our front desk. If you are over 96 years old - you may need Prevnar 56 or the adult Pneumonia vaccine.  **Flu shots are available--- please call and schedule a FLU-CLINIC appointment**  After your visit with [place] today you will receive a survey in the mail or online from [employer] regarding your care with [place]. Please take a moment to fill this out. Your feedback is very important to [place] as you can help [place] better understand your patient needs as well as improve your experience and satisfaction. WE CARE ABOUT YOU!!!  Continue to practice good pulmonary hygiene and hand hygiene Use cool mist humidification keep the house as cool as possible Do not use any overhead fans For  the heartburn you can purchase ranitidine 150 mg, the equate brand and take one twice a day before breakfast and supper. Do this for 4 weeks and then take it as needed prior to a indigestion producing meals like spicy foods etc. We will call with results of the x-rays of the finger as soon as they become available If you continue to have problems with her arms from your neck he should call and make an appointment for follow-up with the neurosurgeon   Arrie Senate MD

## 2016-03-10 LAB — HEPATIC FUNCTION PANEL
ALT: 25 IU/L (ref 0–44)
AST: 22 IU/L (ref 0–40)
Albumin: 4.1 g/dL (ref 3.6–4.8)
Alkaline Phosphatase: 69 IU/L (ref 39–117)
Bilirubin Total: 0.5 mg/dL (ref 0.0–1.2)
Bilirubin, Direct: 0.13 mg/dL (ref 0.00–0.40)
TOTAL PROTEIN: 6.9 g/dL (ref 6.0–8.5)

## 2016-03-10 LAB — CBC WITH DIFFERENTIAL/PLATELET
BASOS ABS: 0 10*3/uL (ref 0.0–0.2)
Basos: 1 %
EOS (ABSOLUTE): 0.1 10*3/uL (ref 0.0–0.4)
Eos: 1 %
Hematocrit: 42.7 % (ref 37.5–51.0)
Hemoglobin: 14.4 g/dL (ref 13.0–17.7)
IMMATURE GRANS (ABS): 0 10*3/uL (ref 0.0–0.1)
Immature Granulocytes: 0 %
LYMPHS ABS: 2.1 10*3/uL (ref 0.7–3.1)
LYMPHS: 35 %
MCH: 29.4 pg (ref 26.6–33.0)
MCHC: 33.7 g/dL (ref 31.5–35.7)
MCV: 87 fL (ref 79–97)
Monocytes Absolute: 0.4 10*3/uL (ref 0.1–0.9)
Monocytes: 7 %
NEUTROS ABS: 3.4 10*3/uL (ref 1.4–7.0)
Neutrophils: 56 %
PLATELETS: 230 10*3/uL (ref 150–379)
RBC: 4.9 x10E6/uL (ref 4.14–5.80)
RDW: 13.4 % (ref 12.3–15.4)
WBC: 6 10*3/uL (ref 3.4–10.8)

## 2016-03-10 LAB — BMP8+EGFR
BUN/Creatinine Ratio: 17 (ref 10–24)
BUN: 14 mg/dL (ref 8–27)
CHLORIDE: 103 mmol/L (ref 96–106)
CO2: 24 mmol/L (ref 18–29)
Calcium: 8.6 mg/dL (ref 8.6–10.2)
Creatinine, Ser: 0.81 mg/dL (ref 0.76–1.27)
GFR calc non Af Amer: 92 mL/min/{1.73_m2} (ref >59)
GFR, EST AFRICAN AMERICAN: 106 mL/min/{1.73_m2} (ref >59)
GLUCOSE: 101 mg/dL — AB (ref 65–99)
POTASSIUM: 4.5 mmol/L (ref 3.5–5.2)
Sodium: 141 mmol/L (ref 134–144)

## 2016-03-10 LAB — NMR, LIPOPROFILE
Cholesterol: 127 mg/dL (ref 100–199)
HDL Cholesterol by NMR: 51 mg/dL (ref >39)
HDL Particle Number: 36.2 umol/L (ref >=30.5)
LDL Particle Number: 743 nmol/L (ref <1000)
LDL SIZE: 21 nm (ref >20.5)
LDL-C: 65 mg/dL (ref 0–99)
Small LDL Particle Number: 218 nmol/L (ref <=527)
Triglycerides by NMR: 54 mg/dL (ref 0–149)

## 2016-03-10 LAB — VITAMIN D 25 HYDROXY (VIT D DEFICIENCY, FRACTURES): VIT D 25 HYDROXY: 41.7 ng/mL (ref 30.0–100.0)

## 2016-03-16 ENCOUNTER — Other Ambulatory Visit: Payer: Self-pay | Admitting: Nurse Practitioner

## 2016-03-20 ENCOUNTER — Telehealth: Payer: Self-pay | Admitting: Family Medicine

## 2016-03-22 NOTE — Telephone Encounter (Signed)
Pt aware of results 

## 2016-05-04 ENCOUNTER — Ambulatory Visit: Payer: Medicare Other | Admitting: Family Medicine

## 2016-05-28 ENCOUNTER — Other Ambulatory Visit: Payer: Self-pay | Admitting: Family Medicine

## 2016-06-05 ENCOUNTER — Ambulatory Visit (INDEPENDENT_AMBULATORY_CARE_PROVIDER_SITE_OTHER): Payer: Medicare Other | Admitting: *Deleted

## 2016-06-05 VITALS — BP 120/79 | HR 82 | Temp 97.3°F | Ht 67.5 in | Wt 210.0 lb

## 2016-06-05 DIAGNOSIS — Z0000 Encounter for general adult medical examination without abnormal findings: Secondary | ICD-10-CM | POA: Diagnosis not present

## 2016-06-05 MED ORDER — LOSARTAN POTASSIUM 100 MG PO TABS: ORAL_TABLET | ORAL | 3 refills | Status: DC

## 2016-06-05 MED ORDER — VITAMIN D (ERGOCALCIFEROL) 1.25 MG (50000 UNIT) PO CAPS: ORAL_CAPSULE | ORAL | 3 refills | Status: DC

## 2016-06-05 NOTE — Patient Instructions (Signed)
  Mr. Cronkright , Thank you for taking time to come for your Medicare Wellness Visit. I appreciate your ongoing commitment to your health goals. Please review the following plan we discussed and let me know if I can assist you in the future.   These are the goals we discussed: Goals    . Have 3 meals a day    . Weight (lb) < 185 lb (83.9 kg)       This is a list of the screening recommended for you and due dates:  Health Maintenance  Topic Date Due  .  Hepatitis C: One time screening is recommended by Center for Disease Control  (CDC) for  adults born from [birth year] through 1967.   06/05/2021*  . Flu Shot  09/12/2016  . Stool Blood Test  09/18/2016  . Tetanus Vaccine  11/26/2022  . Pneumonia vaccines  Completed  *Topic was postponed. The date shown is not the original due date.   Follow up with Dr Laurance Flatten 09/08/11 (CPE) Bring [place] a copy of advanced directives Think about the new shingles vaccine - we can do at the next Office visit.

## 2016-06-05 NOTE — Progress Notes (Addendum)
Subjective:   Daniel Macias is a 66 y.o. male who presents for Medicare Annual/Subsequent preventive examination.  Patient here today for medicare annual wellness exam. 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 During his free time, he and his wife enjoy horseback trail riding. He states he gets in plenty of walking throughout a days work and he eats 2 healthy, home-cooked meals a day. He attends church on sundays at the [facility] in [place] and he assist with the sound and video. He and his wife live in their home and they have 2 sons who live locally. They have 3 dogs and several farm animals. We discussed fall / tripping hazards today. He states that his health is about the same as it was 1 year ago.        Objective:    Vitals: BP 120/79 (BP Location: Left Arm)   Pulse 82   Temp 97.3 F (36.3 C) (Oral)   Ht 5' 7.5" (1.715 m)   Wt 210 lb (95.3 kg)   BMI 32.41 kg/m   Body mass index is 32.41 kg/m.  Tobacco History  Smoking Status  . Never Smoker  Smokeless Tobacco  . Never Used     Counseling given: Not Answered never used tobacco products   Past Medical History:  Diagnosis Date  . Allergy   . Carpal tunnel syndrome, bilateral   . Hx of colonic polyps   . Hyperlipidemia   . Hypertension   . Lower leg fracture   . Sleep apnea    CPAP  . Vitamin D deficiency    Past Surgical History:  Procedure Laterality Date  . CYST REMOVAL HAND Right    wrist   . LEG SURGERY Left 1971  . SPINE SURGERY  1999   DDD   Family History  Problem Relation Age of Onset  . Hypertension Mother   . Cancer Mother     uterine -METS to lungs  . Diabetes Mother   . Emphysema Father   . Heart attack Brother 57    x 2   . Heart disease Brother   . Hypertension Brother   . Diabetes Brother   . Crohn's disease Son    History  Sexual Activity  . Sexual activity: Yes     Outpatient Encounter Prescriptions as of 06/05/2016  Medication Sig  . fluticasone (FLONASE) 50 MCG/ACT nasal spray Place 2 sprays into both nostrils daily as needed.  Marland Kitchen losartan (COZAAR) 100 MG tablet TAKE 1 TABLET (100 MG TOTAL) BY MOUTH DAILY.  . rosuvastatin (CRESTOR) 10 MG tablet TAKE 1 TABLET (10 MG TOTAL) BY MOUTH DAILY.  Marland Kitchen Vitamin D, Ergocalciferol, (DRISDOL) 50000 units CAPS capsule TAKE 1 CAPSULE EVERY 7 DAYS   No facility-administered encounter medications on file as of 06/05/2016.     Activities of Daily Living In your present state of health, do you have any difficulty performing the following activities: 06/05/2016  Hearing? N  Vision? Y  Difficulty concentrating or making decisions? N  Walking or climbing stairs? N  Dressing or bathing? N  Doing errands, shopping? N  Some recent data might be hidden  he wears reading glasses only PRN  Patient Care Team: Chipper Herb, MD as PCP - General (Family Medicine) Kristeen Miss, MD (Neurosurgery) Juanita Craver, MD (Gastroenterology) Minus Breeding, MD as Consulting Physician (Cardiology) Celestia Khat, [place] (Optometry)  Assessment:    Exercise Activities and Dietary recommendations    Goals    . Have 3 meals a day    . Weight (lb) < 185 lb (83.9 kg)     he would like to loose weight and be able to reduce his meds within the next year.  Fall Risk Fall Risk  06/05/2016 03/09/2016 09/02/2015 08/30/2015 06/02/2015  Falls in the past year? No No No No No   Depression Screen PHQ 2/9 Scores 06/05/2016 03/09/2016 09/02/2015 08/30/2015  PHQ - 2 Score 0 0 0 0    Cognitive Function MMSE - Mini Mental State Exam 06/05/2016 06/02/2015  Orientation to time 5 5  Orientation to Place 5 5  Registration 3 3  Attention/ Calculation 5 5  Recall 3 3  Language- name 2 objects 2 2  Language- repeat 1 1  Language- follow 3 step command 3 1  Language- read & follow direction 1 1  Write a sentence 1 1  Copy design 1 1  Total score 30 28   score today was 30/30.       Immunization History  Administered Date(s) Administered  . Influenza,inj,Quad PF,36+ Mos 12/01/2014  . Influenza-Unspecified 11/12/2012, 12/15/2015  . Pneumococcal Conjugate-13 06/01/2014  . Pneumococcal Polysaccharide-23 06/02/2015  . Tdap 11/25/2012  . Zoster 05/28/2012  new shingrix was discussed today.  Screening Tests Health Maintenance  Topic Date Due  . Hepatitis C Screening  06/05/2021 (Originally 17-Mar-1950)  . INFLUENZA VACCINE  09/12/2016  . COLON CANCER SCREENING ANNUAL FOBT  09/18/2016  . TETANUS/TDAP  11/26/2022  . PNA vac Low Risk Adult  Completed      Plan:     Pt is to bring [place] a copy of living will / advanced directives and healthcare POA. He will keep follow up with Dr Laurance Flatten and other specialist.  At next OV we will check cost of new shingles vaccine and check a psa.  During the course of the visit the patient was educated and counseled about the following appropriate screening and preventive services:   Vaccines to include Pneumoccal, Influenza, Hepatitis B, Td, Zostavax, HCV  Electrocardiogram  Cardiovascular Disease  Colorectal cancer screening  Diabetes screening  Prostate Cancer Screening  Glaucoma screening  Nutrition counseling   Smoking cessation counseling  Patient Instructions (the written plan) was given to the patient.    Ashantia Amaral, Cameron Proud, LPN  2/87/6811  I have reviewed and agree with the above AWV documentation.   Mary-Margaret Hassell Done, FNP

## 2016-07-27 ENCOUNTER — Other Ambulatory Visit: Payer: Self-pay | Admitting: Family Medicine

## 2016-08-10 ENCOUNTER — Encounter: Payer: Self-pay | Admitting: *Deleted

## 2016-08-30 ENCOUNTER — Other Ambulatory Visit: Payer: Self-pay | Admitting: Family Medicine

## 2016-09-07 ENCOUNTER — Ambulatory Visit: Payer: Medicare Other | Admitting: Family Medicine

## 2016-10-04 ENCOUNTER — Telehealth: Payer: Self-pay | Admitting: Family Medicine

## 2016-10-05 NOTE — Telephone Encounter (Signed)
appt made

## 2016-10-18 ENCOUNTER — Ambulatory Visit (INDEPENDENT_AMBULATORY_CARE_PROVIDER_SITE_OTHER): Payer: Medicare Other | Admitting: Family Medicine

## 2016-10-18 ENCOUNTER — Encounter: Payer: Self-pay | Admitting: Family Medicine

## 2016-10-18 ENCOUNTER — Ambulatory Visit (INDEPENDENT_AMBULATORY_CARE_PROVIDER_SITE_OTHER): Payer: Medicare Other

## 2016-10-18 VITALS — BP 103/67 | HR 76 | Temp 97.5°F | Ht 67.5 in | Wt 209.0 lb

## 2016-10-18 DIAGNOSIS — M545 Low back pain: Secondary | ICD-10-CM | POA: Diagnosis not present

## 2016-10-18 DIAGNOSIS — M79671 Pain in right foot: Secondary | ICD-10-CM

## 2016-10-18 DIAGNOSIS — G5792 Unspecified mononeuropathy of left lower limb: Secondary | ICD-10-CM

## 2016-10-18 DIAGNOSIS — Z0000 Encounter for general adult medical examination without abnormal findings: Secondary | ICD-10-CM | POA: Diagnosis not present

## 2016-10-18 DIAGNOSIS — I10 Essential (primary) hypertension: Secondary | ICD-10-CM

## 2016-10-18 DIAGNOSIS — N400 Enlarged prostate without lower urinary tract symptoms: Secondary | ICD-10-CM

## 2016-10-18 DIAGNOSIS — E7800 Pure hypercholesterolemia, unspecified: Secondary | ICD-10-CM

## 2016-10-18 DIAGNOSIS — E559 Vitamin D deficiency, unspecified: Secondary | ICD-10-CM

## 2016-10-18 LAB — URINALYSIS, COMPLETE
Bilirubin, UA: NEGATIVE
GLUCOSE, UA: NEGATIVE
KETONES UA: NEGATIVE
Leukocytes, UA: NEGATIVE
Nitrite, UA: NEGATIVE
PROTEIN UA: NEGATIVE
SPEC GRAV UA: 1.025 (ref 1.005–1.030)
UUROB: 0.2 mg/dL (ref 0.2–1.0)
pH, UA: 5 (ref 5.0–7.5)

## 2016-10-18 LAB — MICROSCOPIC EXAMINATION
BACTERIA UA: NONE SEEN
EPITHELIAL CELLS (NON RENAL): NONE SEEN /HPF (ref 0–10)
RENAL EPITHEL UA: NONE SEEN /HPF
WBC UA: NONE SEEN /HPF (ref 0–?)

## 2016-10-18 NOTE — Patient Instructions (Addendum)
Medicare Annual Wellness Visit  [facility] and the medical providers at [facility] strive to bring you the best medical care.  In doing so we not only want to address your current medical conditions and concerns but also to detect new conditions early and prevent illness, disease and health-related problems.    Medicare offers a yearly Wellness Visit which allows our clinical staff to assess your need for preventative services including immunizations, lifestyle education, counseling to decrease risk of preventable diseases and screening for fall risk and other medical concerns.    This visit is provided free of charge (no copay) for all Medicare recipients. The clinical pharmacists at [facility] have begun to conduct these Wellness Visits which will also include a thorough review of all your medications.    As you primary medical provider recommend that you make an appointment for your Annual Wellness Visit if you have not done so already this year.  You may set up this appointment before you leave today or you may call back ([phone]) and schedule an appointment.  Please make sure when you call that you mention that you are scheduling your Annual Wellness Visit with the clinical pharmacist so that the appointment may be made for the proper length of time.     Continue current medications. Continue good therapeutic lifestyle changes which include good diet and exercise. Fall precautions discussed with patient. If an FOBT was given today- please return it to our front desk. If you are over 48 years old - you may need Prevnar 67 or the adult Pneumonia vaccine.  **Flu shots are available--- please call and schedule a FLU-CLINIC appointment**  After your visit with [place] today you will receive a survey in the mail or online from [employer] regarding your care with [place]. Please take a moment to fill this out. Your feedback is very  important to [place] as you can help [place] better understand your patient needs as well as improve your experience and satisfaction. WE CARE ABOUT YOU!!!   We will call with the results of the x-rays as soon as those results become available of the LS spine and left hip and right foot We will call with the blood work results as soon as they become available Continue to drink plenty of fluids and stay well hydrated Always be careful do not put yourself at risk for falling Start a coated baby aspirin, 81 mg 1 daily after a meal Take Tylenol if needed for pain Use warm wet compresses to the right eye and if the stye does not get better call us and we will call in a prescription for an antibiotic for this.

## 2016-10-18 NOTE — Progress Notes (Signed)
Subjective:    Patient ID: Daniel Macias, male    DOB: 14-Mar-1950, 66 y.o.   MRN: [id number]  HPI Patient is here today for annual wellness exam and follow up of chronic medical problems which includes hypertension and hyperlipidemia. He is taking medication regularly.The patient today does complain of some right foot pain and left low back and left leg pain. He also has some right groin discomfort. He is due for a prostate exam to return an FOBT get lab work and get a urinalysis today. His last colonoscopy was in November 2013. His weight is about what it was previously and down 1 pound since that time at 209. The patient denies any chest pain or shortness of breath. He denies any trouble with swallowing, nausea vomiting diarrhea or blood in the stool. He does have occasional indigestion and may have to take a ranitidine once or twice a month secondary to what he eats. I reminded him again to take this if he is going out to eat prior to eating. He is had no change in bowel habits. There is no family history of colon cancer and therefore his next colonoscopy is not due until the planned time of November 2023. He's passing his water without problems and has no problems with his sexual function. As his left thigh pain this is only noticed with prolonged sitting and he can change the position of his back and the pain gets better. He especially notices this when he is driving. The right foot pain is at the base of the second toe of the right foot and is especially severe when he walks and puts pressure on his foot. The patient does have a history in 1999 of surgery on his back because of 2 collapsed disc and Dr. Ellene Route did the surgery.     Patient Active Problem List   Diagnosis Date Noted  . Pre-diabetes 06/02/2015  . Palpitation 09/30/2013  . Allergic rhinitis 05/27/2013  . Hypertension 05/27/2013  . Metabolic syndrome 34/19/6222  . BPH (benign prostatic hyperplasia) 11/25/2012  . Obstructive  sleep apnea 11/25/2012  . Hyperlipemia 05/28/2012  . Vitamin D deficiency disease 05/28/2012   Outpatient Encounter Prescriptions as of 10/18/2016  Medication Sig  . azelastine (ASTELIN) 0.1 % nasal spray PLACE 2 SPRAYS INTO BOTH NOSTRILS AT BEDTIME. USE IN EACH NOSTRIL AS DIRECTED  . fluticasone (FLONASE) 50 MCG/ACT nasal spray PLACE 2 SPRAYS INTO BOTH NOSTRILS DAILY AS NEEDED.  Marland Kitchen losartan (COZAAR) 100 MG tablet TAKE 1 TABLET (100 MG TOTAL) BY MOUTH DAILY.  . rosuvastatin (CRESTOR) 10 MG tablet TAKE 1 TABLET (10 MG TOTAL) BY MOUTH DAILY.  Marland Kitchen Vitamin D, Ergocalciferol, (DRISDOL) 50000 units CAPS capsule TAKE 1 CAPSULE EVERY 7 DAYS   No facility-administered encounter medications on file as of 10/18/2016.       Review of Systems  Constitutional: Negative.   HENT: Negative.   Eyes: Negative.   Respiratory: Negative.   Cardiovascular: Negative.   Gastrointestinal: Negative.   Endocrine: Negative.   Genitourinary: Negative.        Right side groin pain at times   Musculoskeletal: Positive for arthralgias (bilat. hands) and back pain (lower? with right leg pain ////  right foot pain).  Skin: Negative.   Allergic/Immunologic: Negative.   Neurological: Negative.   Hematological: Negative.   Psychiatric/Behavioral: Negative.        Objective:   Physical Exam  Constitutional: He is oriented to person, place, and time. He appears well-developed and well-nourished.  No distress.  The patient is alert and doing well other than these aches and pains especially in the right foot and the discomfort he has in his left leg after sitting for prolonged period of times.  HENT:  Head: Normocephalic and atraumatic.  Right Ear: External ear normal.  Left Ear: External ear normal.  Nose: Nose normal.  Mouth/Throat: Oropharynx is clear and moist. No oropharyngeal exudate.  Eyes: Pupils are equal, round, and reactive to light. Conjunctivae and EOM are normal. Right eye exhibits no discharge. Left eye  exhibits no discharge. No scleral icterus.  There is a small stye in the lower lid of the right eye.  Neck: Normal range of motion. Neck supple. No thyromegaly present.  No bruits thyromegaly or anterior cervical adenopathy  Cardiovascular: Normal rate, regular rhythm, normal heart sounds and intact distal pulses.   No murmur heard. The heart has a regular rate and rhythm at 72/m  Pulmonary/Chest: Effort normal and breath sounds normal. No respiratory distress. He has no wheezes. He has no rales. He exhibits no tenderness.  No chest wall masses. Clear anteriorly and posteriorly and no axillary adenopathy  Abdominal: Soft. Bowel sounds are normal. He exhibits no mass. There is no tenderness. There is no rebound and no guarding.  Mild abdominal obesity without masses tenderness or organ enlargement bruits or inguinal adenopathy  Genitourinary: Rectum normal and penis normal.  Genitourinary Comments: The prostate was enlarged but soft and smooth without lumps or masses. There are no rectal masses. The external genitalia were within normal limits with no inguinal hernias or inguinal adenopathy being palpated.  Musculoskeletal: Normal range of motion. He exhibits tenderness. He exhibits no edema.  Good range of motion of all extremities. There is tenderness at the base of the second distal metatarsal of the right foot.  Lymphadenopathy:    He has no cervical adenopathy.  Neurological: He is alert and oriented to person, place, and time. He has normal reflexes. No cranial nerve deficit.  Skin: Skin is warm and dry. No rash noted.  Psychiatric: He has a normal mood and affect. His behavior is normal. Judgment and thought content normal.  Nursing note and vitals reviewed.  BP 103/67 (BP Location: Left Arm)   Pulse 76   Temp (!) 97.5 F (36.4 C) (Oral)   Ht 5' 7.5" (1.715 m)   Wt 209 lb (94.8 kg)   BMI 32.25 kg/m   X-rays of lumbar spine and left hip and right foot are pending====        Assessment & Plan:  1. Annual physical exam -The patient is up-to-date on all of his health maintenance parameters. His colonoscopy is not due until 2023. He had an EKG and February 2017 and a chest x-ray in January 2017. He will return an FOBT today. - BMP8+EGFR - CBC with Differential/Platelet - Hepatic function panel - VITAMIN D 25 Hydroxy (Vit-D Deficiency, Fractures) - PSA, total and free - NMR, lipoprofile - Urinalysis, Complete  2. Vitamin D deficiency disease - CBC with Differential/Platelet - VITAMIN D 25 Hydroxy (Vit-D Deficiency, Fractures)  3. Essential hypertension -The blood pressure is good today and he will continue with his current treatment. - BMP8+EGFR - CBC with Differential/Platelet - Hepatic function panel  4. Pure hypercholesterolemia -Continue with Crestor and aggressive therapeutic lifestyle changes - CBC with Differential/Platelet - NMR, lipoprofile  5. Benign prostatic hyperplasia, unspecified whether lower urinary tract symptoms present -The patient does have some right groin discomfort especially in bed at night  time. No hernias were palpated. He has not noticed any bulging. - CBC with Differential/Platelet - PSA, total and free - Urinalysis, Complete  6. Low back pain, unspecified back pain laterality, unspecified chronicity, with sciatica presence unspecified -The patient has a history of surgery for collapsed disc in his back way back to 1999. He asked he does not have any back pain currently but he does have pain with certain positions to the left leg down to the foot. This radiates from the left hip. If he sits for a prolonged period of time the pain is worse but if he adjusts his sitting the pain goes away. - DG Lumbar Spine 2-3 Views; Future  7. Right foot pain -The patient is tender at the base of the second toe of the right foot. On the plantar surface. - DG Foot Complete Right; Future  8. Neuropathy of left lower extremity -This pain  seems to be disc related cause of his history and we will get the x-rays of the spine and hip for further clarity. He does have some old films at home from his previous surgery and if we cannot find these results in the system he may have to bring these films and said that the current ones can be compared to the ones from previous.  Patient Instructions                       Medicare Annual Wellness Visit  [facility] and the medical providers at [facility] strive to bring you the best medical care.  In doing so we not only want to address your current medical conditions and concerns but also to detect new conditions early and prevent illness, disease and health-related problems.    Medicare offers a yearly Wellness Visit which allows our clinical staff to assess your need for preventative services including immunizations, lifestyle education, counseling to decrease risk of preventable diseases and screening for fall risk and other medical concerns.    This visit is provided free of charge (no copay) for all Medicare recipients. The clinical pharmacists at [facility] have begun to conduct these Wellness Visits which will also include a thorough review of all your medications.    As you primary medical provider recommend that you make an appointment for your Annual Wellness Visit if you have not done so already this year.  You may set up this appointment before you leave today or you may call back ([phone]) and schedule an appointment.  Please make sure when you call that you mention that you are scheduling your Annual Wellness Visit with the clinical pharmacist so that the appointment may be made for the proper length of time.     Continue current medications. Continue good therapeutic lifestyle changes which include good diet and exercise. Fall precautions discussed with patient. If an FOBT was given today- please return it to our front desk. If you  are over 52 years old - you may need Prevnar 53 or the adult Pneumonia vaccine.  **Flu shots are available--- please call and schedule a FLU-CLINIC appointment**  After your visit with [place] today you will receive a survey in the mail or online from [employer] regarding your care with [place]. Please take a moment to fill this out. Your feedback is very important to [place] as you can help [place] better understand your patient needs as well as improve your experience and satisfaction. WE CARE ABOUT YOU!!!  We will call with the results of the x-rays as soon as those results become available of the LS spine and left hip and right foot We will call with the blood work results as soon as they become available Continue to drink plenty of fluids and stay well hydrated Always be careful do not put yourself at risk for falling Start a coated baby aspirin, 81 mg 1 daily after a meal Take Tylenol if needed for pain Use warm wet compresses to the right eye and if the stye does not get better call us and we will call in a prescription for an antibiotic for this.    Arrie Senate MD

## 2016-10-19 ENCOUNTER — Telehealth: Payer: Self-pay

## 2016-10-19 ENCOUNTER — Telehealth: Payer: Self-pay | Admitting: Family Medicine

## 2016-10-19 DIAGNOSIS — M79671 Pain in right foot: Secondary | ICD-10-CM

## 2016-10-19 LAB — HEPATIC FUNCTION PANEL
ALK PHOS: 78 IU/L (ref 39–117)
ALT: 21 IU/L (ref 0–44)
AST: 22 IU/L (ref 0–40)
Albumin: 4.4 g/dL (ref 3.6–4.8)
Bilirubin Total: 0.6 mg/dL (ref 0.0–1.2)
Bilirubin, Direct: 0.18 mg/dL (ref 0.00–0.40)
TOTAL PROTEIN: 7.3 g/dL (ref 6.0–8.5)

## 2016-10-19 LAB — BMP8+EGFR
BUN / CREAT RATIO: 21 (ref 10–24)
BUN: 14 mg/dL (ref 8–27)
CHLORIDE: 102 mmol/L (ref 96–106)
CO2: 24 mmol/L (ref 20–29)
Calcium: 9.2 mg/dL (ref 8.6–10.2)
Creatinine, Ser: 0.68 mg/dL — ABNORMAL LOW (ref 0.76–1.27)
GFR calc non Af Amer: 98 mL/min/{1.73_m2} (ref >59)
GFR, EST AFRICAN AMERICAN: 113 mL/min/{1.73_m2} (ref >59)
GLUCOSE: 93 mg/dL (ref 65–99)
POTASSIUM: 4.7 mmol/L (ref 3.5–5.2)
SODIUM: 139 mmol/L (ref 134–144)

## 2016-10-19 LAB — CBC WITH DIFFERENTIAL/PLATELET
Basophils Absolute: 0.1 10*3/uL (ref 0.0–0.2)
Basos: 1 %
EOS (ABSOLUTE): 0.1 10*3/uL (ref 0.0–0.4)
Eos: 1 %
HEMOGLOBIN: 14 g/dL (ref 13.0–17.7)
Hematocrit: 42.9 % (ref 37.5–51.0)
Immature Grans (Abs): 0 10*3/uL (ref 0.0–0.1)
Immature Granulocytes: 0 %
LYMPHS ABS: 2.3 10*3/uL (ref 0.7–3.1)
Lymphs: 35 %
MCH: 28.6 pg (ref 26.6–33.0)
MCHC: 32.6 g/dL (ref 31.5–35.7)
MCV: 88 fL (ref 79–97)
MONOCYTES: 6 %
MONOS ABS: 0.4 10*3/uL (ref 0.1–0.9)
NEUTROS ABS: 3.7 10*3/uL (ref 1.4–7.0)
Neutrophils: 57 %
Platelets: 268 10*3/uL (ref 150–379)
RBC: 4.89 x10E6/uL (ref 4.14–5.80)
RDW: 13.8 % (ref 12.3–15.4)
WBC: 6.6 10*3/uL (ref 3.4–10.8)

## 2016-10-19 LAB — NMR, LIPOPROFILE
CHOLESTEROL: 130 mg/dL (ref 100–199)
HDL Cholesterol by NMR: 54 mg/dL (ref >39)
HDL PARTICLE NUMBER: 36.9 umol/L (ref >=30.5)
LDL PARTICLE NUMBER: 801 nmol/L (ref <1000)
LDL SIZE: 20.8 nm (ref >20.5)
LDL-C: 62 mg/dL (ref 0–99)
LP-IR SCORE: 35 (ref <=45)
Small LDL Particle Number: 360 nmol/L (ref <=527)
Triglycerides by NMR: 71 mg/dL (ref 0–149)

## 2016-10-19 LAB — PSA, TOTAL AND FREE
PROSTATE SPECIFIC AG, SERUM: 0.7 ng/mL (ref 0.0–4.0)
PSA FREE: 0.33 ng/mL
PSA, Free Pct: 47.1 %

## 2016-10-19 LAB — VITAMIN D 25 HYDROXY (VIT D DEFICIENCY, FRACTURES): VIT D 25 HYDROXY: 46.1 ng/mL (ref 30.0–100.0)

## 2016-10-19 MED ORDER — PREDNISONE 10 MG PO TABS: ORAL_TABLET | ORAL | 0 refills | Status: DC

## 2016-10-19 NOTE — Telephone Encounter (Signed)
Pt aware of 2 xray reports

## 2016-10-19 NOTE — Telephone Encounter (Signed)
[facility] to x-ray 

## 2016-11-01 ENCOUNTER — Other Ambulatory Visit: Payer: Self-pay | Admitting: *Deleted

## 2016-11-01 MED ORDER — VITAMIN D (ERGOCALCIFEROL) 1.25 MG (50000 UNIT) PO CAPS: ORAL_CAPSULE | ORAL | 1 refills | Status: DC

## 2016-11-02 ENCOUNTER — Other Ambulatory Visit: Payer: Self-pay

## 2016-11-02 MED ORDER — VITAMIN D (ERGOCALCIFEROL) 1.25 MG (50000 UNIT) PO CAPS: ORAL_CAPSULE | ORAL | 0 refills | Status: DC

## 2016-11-02 NOTE — Telephone Encounter (Signed)
Last Vit D 10/18/16  46.1

## 2016-11-15 ENCOUNTER — Telehealth: Payer: Self-pay | Admitting: Family Medicine

## 2016-11-15 DIAGNOSIS — R1084 Generalized abdominal pain: Secondary | ICD-10-CM

## 2016-11-15 DIAGNOSIS — R1031 Right lower quadrant pain: Secondary | ICD-10-CM

## 2016-11-15 NOTE — Telephone Encounter (Signed)
Says you mentioned a ABD [place] if pain persist = lower right abd and into right lower groin area - he wants [place] to sch  (AP )

## 2016-11-15 NOTE — Telephone Encounter (Signed)
Please schedule abdominal ultrasound for persistent right lower abdominal pain

## 2016-11-23 ENCOUNTER — Ambulatory Visit (HOSPITAL_COMMUNITY): Admission: RE | Admit: 2016-11-23 | Payer: Medicare Other | Source: Ambulatory Visit

## 2016-11-23 ENCOUNTER — Ambulatory Visit (HOSPITAL_COMMUNITY)
Admission: RE | Admit: 2016-11-23 | Discharge: 2016-11-23 | Disposition: A | Discharge status: Home / Self Care | Payer: Medicare Other | Source: Ambulatory Visit | Attending: Family Medicine | Admitting: Family Medicine

## 2016-11-23 DIAGNOSIS — R1031 Right lower quadrant pain: Secondary | ICD-10-CM | POA: Diagnosis present

## 2016-11-23 DIAGNOSIS — R1084 Generalized abdominal pain: Secondary | ICD-10-CM | POA: Diagnosis not present

## 2016-11-23 DIAGNOSIS — N281 Cyst of kidney, acquired: Secondary | ICD-10-CM | POA: Diagnosis not present

## 2016-11-30 ENCOUNTER — Telehealth: Payer: Self-pay | Admitting: Family Medicine

## 2016-11-30 NOTE — Telephone Encounter (Signed)
E with pt about [place] , DR Doran Durand and CT

## 2016-12-01 ENCOUNTER — Other Ambulatory Visit: Payer: Self-pay | Admitting: *Deleted

## 2016-12-01 DIAGNOSIS — R109 Unspecified abdominal pain: Secondary | ICD-10-CM

## 2016-12-01 DIAGNOSIS — R1031 Right lower quadrant pain: Secondary | ICD-10-CM

## 2016-12-03 ENCOUNTER — Other Ambulatory Visit: Payer: Self-pay

## 2016-12-03 DIAGNOSIS — M79671 Pain in right foot: Secondary | ICD-10-CM

## 2016-12-07 ENCOUNTER — Ambulatory Visit (HOSPITAL_COMMUNITY)
Admission: RE | Admit: 2016-12-07 | Discharge: 2016-12-07 | Disposition: A | Discharge status: Home / Self Care | Payer: Medicare Other | Source: Ambulatory Visit | Attending: Family Medicine | Admitting: Family Medicine

## 2016-12-07 ENCOUNTER — Other Ambulatory Visit: Payer: Medicare Other

## 2016-12-07 DIAGNOSIS — R1031 Right lower quadrant pain: Secondary | ICD-10-CM | POA: Diagnosis present

## 2016-12-07 DIAGNOSIS — R109 Unspecified abdominal pain: Secondary | ICD-10-CM | POA: Diagnosis present

## 2016-12-07 DIAGNOSIS — I700 Atherosclerosis of aorta: Secondary | ICD-10-CM | POA: Insufficient documentation

## 2016-12-07 DIAGNOSIS — M47816 Spondylosis without myelopathy or radiculopathy, lumbar region: Secondary | ICD-10-CM | POA: Insufficient documentation

## 2016-12-07 DIAGNOSIS — N281 Cyst of kidney, acquired: Secondary | ICD-10-CM | POA: Diagnosis not present

## 2016-12-07 DIAGNOSIS — Z1212 Encounter for screening for malignant neoplasm of rectum: Secondary | ICD-10-CM

## 2016-12-12 LAB — FECAL OCCULT BLOOD, IMMUNOCHEMICAL: Fecal Occult Bld: NEGATIVE

## 2016-12-17 DIAGNOSIS — Z1211 Encounter for screening for malignant neoplasm of colon: Secondary | ICD-10-CM | POA: Diagnosis not present

## 2016-12-17 DIAGNOSIS — K635 Polyp of colon: Secondary | ICD-10-CM | POA: Diagnosis not present

## 2016-12-18 ENCOUNTER — Ambulatory Visit: Payer: Medicare Other | Admitting: Family Medicine

## 2016-12-27 ENCOUNTER — Other Ambulatory Visit: Payer: Self-pay | Admitting: *Deleted

## 2016-12-27 MED ORDER — ROSUVASTATIN CALCIUM 10 MG PO TABS: ORAL_TABLET | ORAL | 1 refills | Status: DC

## 2017-02-13 DIAGNOSIS — M12579 Traumatic arthropathy, unspecified ankle and foot: Secondary | ICD-10-CM | POA: Diagnosis not present

## 2017-02-13 DIAGNOSIS — M25572 Pain in left ankle and joints of left foot: Secondary | ICD-10-CM | POA: Diagnosis not present

## 2017-02-13 DIAGNOSIS — M65871 Other synovitis and tenosynovitis, right ankle and foot: Secondary | ICD-10-CM | POA: Diagnosis not present

## 2017-02-19 DIAGNOSIS — M79642 Pain in left hand: Secondary | ICD-10-CM | POA: Diagnosis not present

## 2017-02-19 DIAGNOSIS — M79641 Pain in right hand: Secondary | ICD-10-CM | POA: Diagnosis not present

## 2017-02-19 DIAGNOSIS — M19049 Primary osteoarthritis, unspecified hand: Secondary | ICD-10-CM | POA: Diagnosis not present

## 2017-03-13 DIAGNOSIS — R69 Illness, unspecified: Secondary | ICD-10-CM | POA: Diagnosis not present

## 2017-04-17 ENCOUNTER — Ambulatory Visit (INDEPENDENT_AMBULATORY_CARE_PROVIDER_SITE_OTHER): Payer: Medicare HMO

## 2017-04-17 ENCOUNTER — Ambulatory Visit (INDEPENDENT_AMBULATORY_CARE_PROVIDER_SITE_OTHER): Payer: Medicare HMO | Admitting: Family Medicine

## 2017-04-17 ENCOUNTER — Encounter: Payer: Self-pay | Admitting: Family Medicine

## 2017-04-17 VITALS — BP 95/62 | HR 80 | Temp 96.9°F | Ht 67.5 in | Wt 213.0 lb

## 2017-04-17 DIAGNOSIS — E559 Vitamin D deficiency, unspecified: Secondary | ICD-10-CM | POA: Diagnosis not present

## 2017-04-17 DIAGNOSIS — N400 Enlarged prostate without lower urinary tract symptoms: Secondary | ICD-10-CM | POA: Diagnosis not present

## 2017-04-17 DIAGNOSIS — E8881 Metabolic syndrome: Secondary | ICD-10-CM | POA: Diagnosis not present

## 2017-04-17 DIAGNOSIS — E7800 Pure hypercholesterolemia, unspecified: Secondary | ICD-10-CM

## 2017-04-17 DIAGNOSIS — I10 Essential (primary) hypertension: Secondary | ICD-10-CM

## 2017-04-17 MED ORDER — FLUTICASONE PROPIONATE 50 MCG/ACT NA SUSP: 2.0000 | Freq: Every day | NASAL | 2 refills | Status: DC | PRN

## 2017-04-17 MED ORDER — LOSARTAN POTASSIUM 100 MG PO TABS: ORAL_TABLET | ORAL | 3 refills | Status: DC

## 2017-04-17 MED ORDER — HYDROCODONE-HOMATROPINE 5-1.5 MG/5ML PO SYRP: 5.0000 mL | ORAL_SOLUTION | Freq: Two times a day (BID) | ORAL | 0 refills | Status: DC | PRN

## 2017-04-17 MED ORDER — VITAMIN D (ERGOCALCIFEROL) 1.25 MG (50000 UNIT) PO CAPS: ORAL_CAPSULE | ORAL | 1 refills | Status: DC

## 2017-04-17 MED ORDER — AZELASTINE HCL 0.1 % NA SOLN: 2.0000 | Freq: Every day | NASAL | 2 refills | Status: AC

## 2017-04-17 MED ORDER — ROSUVASTATIN CALCIUM 10 MG PO TABS: ORAL_TABLET | ORAL | 3 refills | Status: DC

## 2017-04-17 NOTE — Addendum Note (Signed)
Addended by: Zannie Cove on: 04/17/2017 09:19 AM   Modules accepted: Orders

## 2017-04-17 NOTE — Patient Instructions (Addendum)
Medicare Annual Wellness Visit  [facility] and the medical providers at [facility] strive to bring you the best medical care.  In doing so we not only want to address your current medical conditions and concerns but also to detect new conditions early and prevent illness, disease and health-related problems.    Medicare offers a yearly Wellness Visit which allows our clinical staff to assess your need for preventative services including immunizations, lifestyle education, counseling to decrease risk of preventable diseases and screening for fall risk and other medical concerns.    This visit is provided free of charge (no copay) for all Medicare recipients. The clinical pharmacists at [facility] have begun to conduct these Wellness Visits which will also include a thorough review of all your medications.    As you primary medical provider recommend that you make an appointment for your Annual Wellness Visit if you have not done so already this year.  You may set up this appointment before you leave today or you may call back ([phone]) and schedule an appointment.  Please make sure when you call that you mention that you are scheduling your Annual Wellness Visit with the clinical pharmacist so that the appointment may be made for the proper length of time.     Continue current medications. Continue good therapeutic lifestyle changes which include good diet and exercise. Fall precautions discussed with patient. If an FOBT was given today- please return it to our front desk. If you are over 25 years old - you may need Prevnar 44 or the adult Pneumonia vaccine.  **Flu shots are available--- please call and schedule a FLU-CLINIC appointment**  After your visit with [place] today you will receive a survey in the mail or online from [employer] regarding your care with [place]. Please take a moment to fill this out. Your feedback is very  important to [place] as you can help [place] better understand your patient needs as well as improve your experience and satisfaction. WE CARE ABOUT YOU!!!   We will call with lab work and chest x-ray results as soon as those results become available Do not forget to get your repeat colonoscopy in November 2021 Replace fluids especially when weather is warmer and monitor blood pressure closely if lightheadedness occurs. Use Hycodan if needed for severe cough

## 2017-04-17 NOTE — Progress Notes (Signed)
Subjective:    Patient ID: Daniel Macias, male    DOB: [birth year], 67 y.o.   MRN: [id number]  HPI Pt here for follow up and management of chronic medical problems which includes hypertension and hyperlipidemia. He is taking medication regularly.  Patient is doing well today with no specific complaints.  He is requesting refills on all of his medicines and would also like a prescription for a controlled cough medicine, Hycodan.  He is due to get a chest x-ray and will get lab work today.  He did not take a flu shot as he refused.  His other health maintenance issues appear to be up-to-date.  He comes to the office every 6 months.  Patient would like to have his Hycodan refill as needed for cough.  He denies any chest pain pressure tightness or shortness of breath.  He has been physically active and has had no discomfort.  He denies any trouble with nausea vomiting diarrhea blood in the stool change in bowel habits or black tarry bowel movements.  He did have a colonoscopy in November of this past year and they did find polyps and was told that he would need another colonoscopy in 3 years.  He has no trouble passing his water and he is up-to-date on his eye exams.      Patient Active Problem List   Diagnosis Date Noted  . Pre-diabetes 06/02/2015  . Palpitation 09/30/2013  . Allergic rhinitis 05/27/2013  . Hypertension 05/27/2013  . Metabolic syndrome 12/13/5943  . BPH (benign prostatic hyperplasia) 11/25/2012  . Obstructive sleep apnea 11/25/2012  . Hyperlipemia 05/28/2012  . Vitamin D deficiency disease 05/28/2012   Outpatient Encounter Medications as of 04/17/2017  Medication Sig  . azelastine (ASTELIN) 0.1 % nasal spray PLACE 2 SPRAYS INTO BOTH NOSTRILS AT BEDTIME. USE IN EACH NOSTRIL AS DIRECTED  . fluticasone (FLONASE) 50 MCG/ACT nasal spray PLACE 2 SPRAYS INTO BOTH NOSTRILS DAILY AS NEEDED.  Marland Kitchen losartan (COZAAR) 100 MG tablet TAKE 1 TABLET (100 MG TOTAL) BY MOUTH DAILY.  .  rosuvastatin (CRESTOR) 10 MG tablet TAKE 1 TABLET (10 MG TOTAL) BY MOUTH DAILY.  Marland Kitchen Vitamin D, Ergocalciferol, (DRISDOL) 50000 units CAPS capsule TAKE 1 CAPSULE EVERY 7 DAYS  . [DISCONTINUED] predniSONE (DELTASONE) 10 MG tablet Take 1 tab QID x 2 days, then 1 tab TID x 2 days, then 1 tab BID x 2 days then 1 tab QD x 2 days.   No facility-administered encounter medications on file as of 04/17/2017.      Review of Systems  Constitutional: Negative.   HENT: Negative.   Eyes: Negative.   Respiratory: Negative.   Cardiovascular: Negative.   Gastrointestinal: Negative.   Endocrine: Negative.   Genitourinary: Negative.   Musculoskeletal: Negative.   Skin: Negative.   Allergic/Immunologic: Negative.   Neurological: Negative.   Hematological: Negative.   Psychiatric/Behavioral: Negative.        Objective:   Physical Exam  Constitutional: He is oriented to person, place, and time. He appears well-developed and well-nourished. No distress.  The patient is pleasant and doing well with no specific complaints.  His medical history and preventative care were reviewed with him during the visit today.  HENT:  Head: Normocephalic and atraumatic.  Right Ear: External ear normal.  Left Ear: External ear normal.  Mouth/Throat: Oropharynx is clear and moist. No oropharyngeal exudate.  Right nasal congestion left side  Eyes: Conjunctivae and EOM are normal. Pupils are equal, round, and reactive to light.  Right eye exhibits no discharge. Left eye exhibits no discharge. No scleral icterus.  Neck: Normal range of motion. Neck supple. No thyromegaly present.  No bruits thyromegaly or anterior cervical adenopathy  Cardiovascular: Normal rate, regular rhythm, normal heart sounds and intact distal pulses.  No murmur heard. The heart is regular at 72/min  Pulmonary/Chest: Effort normal and breath sounds normal. No respiratory distress. He has no wheezes. He has no rales. He exhibits no tenderness.  No chest  wall masses or axillary adenopathy and lungs were clear anteriorly and posteriorly  Abdominal: Soft. Bowel sounds are normal. He exhibits no mass. There is no tenderness. There is no rebound and no guarding.  No abdominal tenderness masses bruits or inguinal adenopathy  Musculoskeletal: Normal range of motion. He exhibits no edema.  The patient has a history of lumbar surgery secondary to degenerative disc disease in 1999.  Lymphadenopathy:    He has no cervical adenopathy.  Neurological: He is alert and oriented to person, place, and time. He has normal reflexes. No cranial nerve deficit.  Skin: Skin is warm and dry. No rash noted.  Psychiatric: He has a normal mood and affect. His behavior is normal. Judgment and thought content normal.  Nursing note and vitals reviewed.  BP 95/62 (BP Location: Left Arm)   Pulse 80   Temp (!) 96.9 F (36.1 C) (Oral)   Ht 5' 7.5" (1.715 m)   Wt 213 lb (96.6 kg)   BMI 32.87 kg/m   Chest x-ray with results pending==      Assessment & Plan:  1. Essential hypertension -Patient checks blood pressures regularly at home and they usually run in the 110 over the 70-80 range.  He has had no lightheaded episodes.  He will be reminded to especially watch blood pressure readings and lightheadedness in the summertime when it is hot and make sure that he replaces his fluids adequately. - BMP8+EGFR - CBC with Differential/Platelet - Hepatic function panel - DG Chest 2 View; Future  2. Vitamin D deficiency disease -Continue current treatment pending results of lab work - CBC with Differential/Platelet - VITAMIN D 25 Hydroxy (Vit-D Deficiency, Fractures)  3. Pure hypercholesterolemia -Continue current treatment and aggressive therapeutic lifestyle changes pending results of lab work - CBC with Differential/Platelet - Lipid panel - DG Chest 2 View; Future  4. Benign prostatic hyperplasia, unspecified whether lower urinary tract symptoms present -No  complaints today with voiding - CBC with Differential/Platelet  5. Metabolic syndrome -Continue to make all efforts at weight loss through diet and exercise  Meds ordered this encounter  Medications  . azelastine (ASTELIN) 0.1 % nasal spray    Sig: Place 2 sprays into both nostrils at bedtime. Use in each nostril as directed    Dispense:  30 mL    Refill:  2  . fluticasone (FLONASE) 50 MCG/ACT nasal spray    Sig: Place 2 sprays into both nostrils daily as needed.    Dispense:  16 g    Refill:  2  . losartan (COZAAR) 100 MG tablet    Sig: TAKE 1 TABLET (100 MG TOTAL) BY MOUTH DAILY.    Dispense:  90 tablet    Refill:  3  . rosuvastatin (CRESTOR) 10 MG tablet    Sig: TAKE 1 TABLET (10 MG TOTAL) BY MOUTH DAILY.    Dispense:  90 tablet    Refill:  3  . Vitamin D, Ergocalciferol, (DRISDOL) 50000 units CAPS capsule    Sig: TAKE 1 CAPSULE  EVERY 7 DAYS    Dispense:  12 capsule    Refill:  1   Patient Instructions                       Medicare Annual Wellness Visit  [facility] and the medical providers at [facility] strive to bring you the best medical care.  In doing so we not only want to address your current medical conditions and concerns but also to detect new conditions early and prevent illness, disease and health-related problems.    Medicare offers a yearly Wellness Visit which allows our clinical staff to assess your need for preventative services including immunizations, lifestyle education, counseling to decrease risk of preventable diseases and screening for fall risk and other medical concerns.    This visit is provided free of charge (no copay) for all Medicare recipients. The clinical pharmacists at [facility] have begun to conduct these Wellness Visits which will also include a thorough review of all your medications.    As you primary medical provider recommend that you make an appointment for your Annual Wellness Visit  if you have not done so already this year.  You may set up this appointment before you leave today or you may call back ([phone]) and schedule an appointment.  Please make sure when you call that you mention that you are scheduling your Annual Wellness Visit with the clinical pharmacist so that the appointment may be made for the proper length of time.     Continue current medications. Continue good therapeutic lifestyle changes which include good diet and exercise. Fall precautions discussed with patient. If an FOBT was given today- please return it to our front desk. If you are over 71 years old - you may need Prevnar 18 or the adult Pneumonia vaccine.  **Flu shots are available--- please call and schedule a FLU-CLINIC appointment**  After your visit with [place] today you will receive a survey in the mail or online from [employer] regarding your care with [place]. Please take a moment to fill this out. Your feedback is very important to [place] as you can help [place] better understand your patient needs as well as improve your experience and satisfaction. WE CARE ABOUT YOU!!!   We will call with lab work and chest x-ray results as soon as those results become available Do not forget to get your repeat colonoscopy in November 2021 Replace fluids especially when weather is warmer and monitor blood pressure closely if lightheadedness occurs. Use Hycodan if needed for severe cough  Arrie Senate MD   .

## 2017-04-18 LAB — LIPID PANEL
CHOLESTEROL TOTAL: 146 mg/dL (ref 100–199)
Chol/HDL Ratio: 2.7 ratio (ref 0.0–5.0)
HDL: 55 mg/dL (ref >39)
LDL Calculated: 82 mg/dL (ref 0–99)
TRIGLYCERIDES: 46 mg/dL (ref 0–149)
VLDL Cholesterol Cal: 9 mg/dL (ref 5–40)

## 2017-04-18 LAB — BMP8+EGFR
BUN/Creatinine Ratio: 22 (ref 10–24)
BUN: 19 mg/dL (ref 8–27)
CALCIUM: 9.1 mg/dL (ref 8.6–10.2)
CO2: 23 mmol/L (ref 20–29)
Chloride: 101 mmol/L (ref 96–106)
Creatinine, Ser: 0.87 mg/dL (ref 0.76–1.27)
GFR calc Af Amer: 103 mL/min/{1.73_m2} (ref >59)
GFR calc non Af Amer: 89 mL/min/{1.73_m2} (ref >59)
GLUCOSE: 108 mg/dL — AB (ref 65–99)
POTASSIUM: 4.4 mmol/L (ref 3.5–5.2)
SODIUM: 138 mmol/L (ref 134–144)

## 2017-04-18 LAB — CBC WITH DIFFERENTIAL/PLATELET
BASOS ABS: 0 10*3/uL (ref 0.0–0.2)
Basos: 0 %
EOS (ABSOLUTE): 0.1 10*3/uL (ref 0.0–0.4)
Eos: 1 %
Hematocrit: 44.3 % (ref 37.5–51.0)
Hemoglobin: 14.8 g/dL (ref 13.0–17.7)
IMMATURE GRANS (ABS): 0 10*3/uL (ref 0.0–0.1)
IMMATURE GRANULOCYTES: 0 %
LYMPHS: 29 %
Lymphocytes Absolute: 1.7 10*3/uL (ref 0.7–3.1)
MCH: 28.7 pg (ref 26.6–33.0)
MCHC: 33.4 g/dL (ref 31.5–35.7)
MCV: 86 fL (ref 79–97)
MONOS ABS: 0.4 10*3/uL (ref 0.1–0.9)
Monocytes: 7 %
NEUTROS PCT: 63 %
Neutrophils Absolute: 3.5 10*3/uL (ref 1.4–7.0)
PLATELETS: 256 10*3/uL (ref 150–379)
RBC: 5.15 x10E6/uL (ref 4.14–5.80)
RDW: 13.7 % (ref 12.3–15.4)
WBC: 5.7 10*3/uL (ref 3.4–10.8)

## 2017-04-18 LAB — HEPATIC FUNCTION PANEL
ALK PHOS: 73 IU/L (ref 39–117)
ALT: 25 IU/L (ref 0–44)
AST: 24 IU/L (ref 0–40)
Albumin: 4.4 g/dL (ref 3.6–4.8)
BILIRUBIN, DIRECT: 0.16 mg/dL (ref 0.00–0.40)
Bilirubin Total: 0.5 mg/dL (ref 0.0–1.2)
TOTAL PROTEIN: 7.1 g/dL (ref 6.0–8.5)

## 2017-04-18 LAB — VITAMIN D 25 HYDROXY (VIT D DEFICIENCY, FRACTURES): VIT D 25 HYDROXY: 42.2 ng/mL (ref 30.0–100.0)

## 2017-06-10 ENCOUNTER — Ambulatory Visit (INDEPENDENT_AMBULATORY_CARE_PROVIDER_SITE_OTHER): Payer: Medicare HMO | Admitting: *Deleted

## 2017-06-10 ENCOUNTER — Encounter: Payer: Self-pay | Admitting: *Deleted

## 2017-06-10 VITALS — BP 119/71 | HR 74 | Ht 67.0 in | Wt 214.0 lb

## 2017-06-10 DIAGNOSIS — Z0000 Encounter for general adult medical examination without abnormal findings: Secondary | ICD-10-CM | POA: Diagnosis not present

## 2017-06-10 NOTE — Patient Instructions (Addendum)
Please work on your goal of losing weight by reducing carbohydrates and sweets.    Please bring a copy of your Living Will to our office to be filed in your medical record.  Thank you for coming in for your Annual Wellness Visit today!    Preventive Care 69 Years and Older, Male Preventive care refers to lifestyle choices and visits with your health care provider that can promote health and wellness. What does preventive care include?  A yearly physical exam. This is also called an annual well check.  Dental exams once or twice a year.  Routine eye exams. Ask your health care provider how often you should have your eyes checked.  Personal lifestyle choices, including: ? Daily care of your teeth and gums. ? Regular physical activity. ? Eating a healthy diet. ? Avoiding tobacco and drug use. ? Limiting alcohol use. ? Practicing safe sex. ? Taking low doses of aspirin every day. ? Taking vitamin and mineral supplements as recommended by your health care provider. What happens during an annual well check? The services and screenings done by your health care provider during your annual well check will depend on your age, overall health, lifestyle risk factors, and family history of disease. Counseling Your health care provider may ask you questions about your:  Alcohol use.  Tobacco use.  Drug use.  Emotional well-being.  Home and relationship well-being.  Sexual activity.  Eating habits.  History of falls.  Memory and ability to understand (cognition).  Work and work Statistician.  Screening You may have the following tests or measurements:  Height, weight, and BMI.  Blood pressure.  Lipid and cholesterol levels. These may be checked every 5 years, or more frequently if you are over 4 years old.  Skin check.  Lung cancer screening. You may have this screening every year starting at age 45 if you have a 30-pack-year history of smoking and currently smoke or  have quit within the past 15 years.  Fecal occult blood test (FOBT) of the stool. You may have this test every year starting at age 42.  Flexible sigmoidoscopy or colonoscopy. You may have a sigmoidoscopy every 5 years or a colonoscopy every 10 years starting at age 32.  Prostate cancer screening. Recommendations will vary depending on your family history and other risks.  Hepatitis C blood test.  Hepatitis B blood test.  Sexually transmitted disease (STD) testing.  Diabetes screening. This is done by checking your blood sugar (glucose) after you have not eaten for a while (fasting). You may have this done every 1-3 years.  Abdominal aortic aneurysm (AAA) screening. You may need this if you are a current or former smoker.  Osteoporosis. You may be screened starting at age 6 if you are at high risk.  Talk with your health care provider about your test results, treatment options, and if necessary, the need for more tests. Vaccines Your health care provider may recommend certain vaccines, such as:  Influenza vaccine. This is recommended every year.  Tetanus, diphtheria, and acellular pertussis (Tdap, Td) vaccine. You may need a Td booster every 10 years.  Varicella vaccine. You may need this if you have not been vaccinated.  Zoster vaccine. You may need this after age 23.  Measles, mumps, and rubella (MMR) vaccine. You may need at least one dose of MMR if you were born in 1957 or later. You may also need a second dose.  Pneumococcal 13-valent conjugate (PCV13) vaccine. One dose is recommended after  age 39.  Pneumococcal polysaccharide (PPSV23) vaccine. One dose is recommended after age 64.  Meningococcal vaccine. You may need this if you have certain conditions.  Hepatitis A vaccine. You may need this if you have certain conditions or if you travel or work in places where you may be exposed to hepatitis A.  Hepatitis B vaccine. You may need this if you have certain conditions  or if you travel or work in places where you may be exposed to hepatitis B.  Haemophilus influenzae type b (Hib) vaccine. You may need this if you have certain risk factors.  Talk to your health care provider about which screenings and vaccines you need and how often you need them. This information is not intended to replace advice given to you by your health care provider. Make sure you discuss any questions you have with your health care provider. Document Released: 02/25/2015 Document Revised: 10/19/2015 Document Reviewed: 11/30/2014 Elsevier Interactive Patient Education  [employer].

## 2017-06-11 ENCOUNTER — Telehealth: Payer: Self-pay | Admitting: *Deleted

## 2017-06-11 ENCOUNTER — Encounter: Payer: Self-pay | Admitting: *Deleted

## 2017-06-11 DIAGNOSIS — R519 Headache, unspecified: Secondary | ICD-10-CM

## 2017-06-11 DIAGNOSIS — R51 Headache: Principal | ICD-10-CM

## 2017-06-11 NOTE — Progress Notes (Signed)
Subjective:   Daniel Macias is a 67 y.o. male who presents for Medicare Annual/Subsequent preventive examination.  Daniel Macias is a retired [place], [place].  He currently works raising cows and horses, and flipping houses.  He enjoys attending church, horseback riding and working saduko puzzles.  Daniel Macias lives at home with his wife, and they have 2 children and 7 grandchildren.  They have 3 outside dogs and various farms animals to take care of.  He reports no hospitalizations, ER visits, or surgeries in the past year, and feels his health is about the same as last year.  Of note, he has been experiencing a dull left sided headache for one week that worsens with squinting his eyes.  He denies blurred vision.   Also, he complains of left ankle pain today.  He broke his left ankle in 1971 and has been having some pain related to this old fracture recently.     Review of Systems:   Musculoskeletal - left ankle pain HEENT- left side head pain with squinting eyes All other systems negative         Objective:    Vitals: BP 119/71   Pulse 74   Ht 5\' 7"  (1.702 m)   Wt 214 lb (97.1 kg)   BMI 33.52 kg/m   Body mass index is 33.52 kg/m.  Advanced Directives 06/11/2017 06/05/2016 06/02/2015  Does Patient Have a Medical Advance Directive? Yes Yes No  Type of Advance Directive Living will [place];Living will -  Does patient want to make changes to medical advance directive? No - Patient declined No - Patient declined -  Copy of [place] in Chart? - No - copy requested -  Would patient like information on creating a medical advance directive? - - Yes - Scientist, clinical (histocompatibility and immunogenetics) given    Tobacco Social History   Tobacco Use  Smoking Status Never Smoker  Smokeless Tobacco Never Used     Counseling given: No   Clinical Intake:     Pain Score: 8  Left ankle                Past Medical History:  Diagnosis Date  . Allergy     . Carpal tunnel syndrome, bilateral   . Hx of colonic polyps   . Hyperlipidemia   . Hypertension   . Lower leg fracture   . Sleep apnea    CPAP  . Vitamin D deficiency    Past Surgical History:  Procedure Laterality Date  . CYST REMOVAL HAND Right    wrist   . LEG SURGERY Left 1971  . SPINE SURGERY  1999   DDD   Family History  Problem Relation Age of Onset  . Hypertension Mother   . Cancer Mother        uterine -METS to lungs  . Diabetes Mother   . Emphysema Father   . Heart attack Brother 57       x 2   . Heart disease Brother   . Hypertension Brother   . Diabetes Brother   . Crohn's disease Son    Social History   Socioeconomic History  . Marital status: Married    Spouse name: Not on file  . Number of children: 2  . Years of education: Not on file  . Highest education level: Not on file  Occupational History  . Not on file  Social Needs  . Financial resource strain: Not on file  .  Food insecurity:    Worry: Not on file    Inability: Not on file  . Transportation needs:    Medical: Not on file    Non-medical: Not on file  Tobacco Use  . Smoking status: Never Smoker  . Smokeless tobacco: Never Used  Substance and Sexual Activity  . Alcohol use: Yes    Comment: occasional  . Drug use: No  . Sexual activity: Yes  Lifestyle  . Physical activity:    Days per week: Not on file    Minutes per session: Not on file  . Stress: Not on file  Relationships  . Social connections:    Talks on phone: Not on file    Gets together: Not on file    Attends religious service: Not on file    Active member of club or organization: Not on file    Attends meetings of clubs or organizations: Not on file    Relationship status: Not on file  Other Topics Concern  . 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     Outpatient Encounter Medications as of 06/10/2017  Medication Sig  . azelastine (ASTELIN) 0.1 % nasal spray Place 2 sprays into  both nostrils at bedtime. Use in each nostril as directed  . fluticasone (FLONASE) 50 MCG/ACT nasal spray Place 2 sprays into both nostrils daily as needed.  Marland Kitchen HYDROcodone-homatropine (HYCODAN) 5-1.5 MG/5ML syrup Take 5 mLs by mouth every 12 (twelve) hours as needed for cough.  . losartan (COZAAR) 100 MG tablet TAKE 1 TABLET (100 MG TOTAL) BY MOUTH DAILY.  . rosuvastatin (CRESTOR) 10 MG tablet TAKE 1 TABLET (10 MG TOTAL) BY MOUTH DAILY.  Marland Kitchen Vitamin D, Ergocalciferol, (DRISDOL) 50000 units CAPS capsule TAKE 1 CAPSULE EVERY 7 DAYS   No facility-administered encounter medications on file as of 06/10/2017.     Activities of Daily Living In your present state of health, do you have any difficulty performing the following activities: 06/10/2017  Hearing? N  Vision? N  Difficulty concentrating or making decisions? N  Walking or climbing stairs? Y  Comment When left ankle is painful   Dressing or bathing? N  Doing errands, shopping? N  Preparing Food and eating ? N  Using the Toilet? N  In the past six months, have you accidently leaked urine? N  Do you have problems with loss of bowel control? N  Managing your Medications? N  Managing your Finances? N  Housekeeping or managing your Housekeeping? N  Some recent data might be hidden    Patient Care Team: Chipper Herb, MD as PCP - General (Family Medicine) Kristeen Miss, MD (Neurosurgery) Juanita Craver, MD (Gastroenterology) Minus Breeding, MD as Consulting Physician (Cardiology) Celestia Khat, OD (Optometry)   Assessment:   This is a routine wellness examination for Daniel Macias.  Exercise Activities and Dietary recommendations Current Exercise Habits: The patient has a physically strenous job, but has no regular exercise apart from work.  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    Goals    . Weight (lb) < 185 lb (83.9 kg)    . Weight (lb) < 200 lb (90.7 kg)     Decrease carbohydrate and sweets intake        Patient states he usually has 2 meals per day and snacks as needed.  He tries to get plenty of vegetables and lean proteins.  Recommended a diet of mostly lean proteins, fruits, vegetables,  and whole grains.  Fall Risk Fall Risk  10/18/2016 06/05/2016 03/09/2016 09/02/2015 08/30/2015  Falls in the past year? No No No No No   Is the patient's home free of loose throw rugs in walkways, pet beds, electrical cords, etc?   yes      Grab bars in the bathroom? no      Handrails on the stairs?   yes      Adequate lighting?   yes   Depression Screen PHQ 2/9 Scores 06/11/2017 10/18/2016 06/05/2016 03/09/2016  PHQ - 2 Score 0 0 0 0    Cognitive Function MMSE - Mini Mental State Exam 06/11/2017 06/05/2016 06/02/2015  Orientation to time 5 5 5   Orientation to Place 5 5 5   Registration 3 3 3   Attention/ Calculation 5 5 5   Recall 3 3 3   Language- name 2 objects 2 2 2   Language- repeat 1 1 1   Language- follow 3 step command 3 3 1   Language- read & follow direction 1 1 1   Write a sentence 1 1 1   Copy design 1 1 1   Total score 30 30 28         Immunization History  Administered Date(s) Administered  . Influenza,inj,Quad PF,6+ Mos 12/01/2014  . Influenza-Unspecified 11/12/2012, 12/15/2015  . Pneumococcal Conjugate-13 06/01/2014  . Pneumococcal Polysaccharide-23 06/02/2015  . Tdap 11/25/2012  . Zoster 05/28/2012    Qualifies for Shingles Vaccine? Yes, currently on back order  Screening Tests Health Maintenance  Topic Date Due  . INFLUENZA VACCINE  12/18/2017 (Originally 09/12/2017)  . Hepatitis C Screening  06/05/2021 (Originally 10-21-1950)  . COLON CANCER SCREENING ANNUAL FOBT  12/07/2017  . COLONOSCOPY  11/12/2021  . TETANUS/TDAP  11/26/2022  . PNA vac Low Risk Adult  Completed    Additional Screenings:  Hepatitis C Screening:- recommend at next visit with Dr. Laurance Flatten      Plan:  Reduce carbohydrates and sweets to work on your goal of losing weight.   Bring a copy of your Living Will to  our office to be filed in your medical record at your convenience. Ask your son Shayde Gervacio O.D.) to look at eyes this evening to make sure head pain is not eye related.   I have personally reviewed and noted the following in the patient's chart:   . Medical and social history . Use of alcohol, tobacco or illicit drugs  . Current medications and supplements . Functional ability and status . Nutritional status . Physical activity . Advanced directives . List of other physicians . Hospitalizations, surgeries, and ER visits in previous 12 months . Vitals . Screenings to include cognitive, depression, and falls . Referrals and appointments  In addition, I have reviewed and discussed with patient certain preventive protocols, quality metrics, and best practice recommendations. A written personalized care plan for preventive services as well as general preventive health recommendations were provided to patient.     Riyad Keena M, RN  06/11/2017  I have reviewed and agree with the above AWV documentation.   Laroy Apple, MD [facility] 06/13/2017, 5:07 PM

## 2017-06-11 NOTE — Telephone Encounter (Signed)
During  AWV yesterday patient stated that he has been having a dull aching on the left side of his head that is worsened with squinting his eyes.  He denies blurred vision and any weakness.  Advised patient to have his son who is an optometrist  (Brent Shankel O.D.) examine his eyes that evening to rule out any abnormal findings. Patient called today stating that he had his son evaluate his eyes, and he did not find anything abnormal.  He suggested that his father have lab work testing for lyme disease and an ESR.  Advised will send to Jamie Bullin, LPN and Dr. Moore to order if appropriate.  Patient interested in CT scan if Dr. Moore feels necessary. 

## 2017-06-11 NOTE — Telephone Encounter (Signed)
Do lab work for Lyme disease and [facility] spotted fever along with sedimentation rate and CBC.  Also get BMP.  Get CT scan of head for headache area

## 2017-06-12 ENCOUNTER — Other Ambulatory Visit: Payer: Medicare HMO

## 2017-06-12 ENCOUNTER — Other Ambulatory Visit: Payer: Self-pay | Admitting: *Deleted

## 2017-06-12 DIAGNOSIS — R519 Headache, unspecified: Secondary | ICD-10-CM

## 2017-06-12 DIAGNOSIS — R51 Headache: Principal | ICD-10-CM

## 2017-06-12 NOTE — Telephone Encounter (Signed)
Pt called - aware of lab orders and CT order.

## 2017-06-14 LAB — CBC WITH DIFFERENTIAL/PLATELET
BASOS ABS: 0 10*3/uL (ref 0.0–0.2)
Basos: 0 %
EOS (ABSOLUTE): 0.1 10*3/uL (ref 0.0–0.4)
Eos: 2 %
Hematocrit: 40.4 % (ref 37.5–51.0)
Hemoglobin: 14.1 g/dL (ref 13.0–17.7)
Immature Grans (Abs): 0 10*3/uL (ref 0.0–0.1)
Immature Granulocytes: 0 %
LYMPHS ABS: 2 10*3/uL (ref 0.7–3.1)
Lymphs: 27 %
MCH: 29.3 pg (ref 26.6–33.0)
MCHC: 34.9 g/dL (ref 31.5–35.7)
MCV: 84 fL (ref 79–97)
Monocytes Absolute: 0.6 10*3/uL (ref 0.1–0.9)
Monocytes: 9 %
Neutrophils Absolute: 4.6 10*3/uL (ref 1.4–7.0)
Neutrophils: 62 %
Platelets: 270 10*3/uL (ref 150–379)
RBC: 4.82 x10E6/uL (ref 4.14–5.80)
RDW: 14.2 % (ref 12.3–15.4)
WBC: 7.4 10*3/uL (ref 3.4–10.8)

## 2017-06-14 LAB — LYME AB/WESTERN BLOT REFLEX

## 2017-06-14 LAB — BMP8+EGFR
BUN / CREAT RATIO: 19 (ref 10–24)
BUN: 20 mg/dL (ref 8–27)
CHLORIDE: 102 mmol/L (ref 96–106)
CO2: 22 mmol/L (ref 20–29)
Calcium: 9.6 mg/dL (ref 8.6–10.2)
Creatinine, Ser: 1.04 mg/dL (ref 0.76–1.27)
GFR calc Af Amer: 85 mL/min/{1.73_m2} (ref >59)
GFR calc non Af Amer: 73 mL/min/{1.73_m2} (ref >59)
GLUCOSE: 83 mg/dL (ref 65–99)
POTASSIUM: 4.4 mmol/L (ref 3.5–5.2)
SODIUM: 142 mmol/L (ref 134–144)

## 2017-06-14 LAB — ROCKY MTN SPOTTED FVR ABS PNL(IGG+IGM)
RMSF IGM: 0.3 {index} (ref 0.00–0.89)
RMSF IgG: NEGATIVE

## 2017-06-14 LAB — SEDIMENTATION RATE: SED RATE: 11 mm/h (ref 0–30)

## 2017-06-25 ENCOUNTER — Ambulatory Visit (HOSPITAL_COMMUNITY)
Admission: RE | Admit: 2017-06-25 | Discharge: 2017-06-25 | Disposition: A | Discharge status: Home / Self Care | Payer: Medicare HMO | Source: Ambulatory Visit | Attending: Family Medicine | Admitting: Family Medicine

## 2017-06-25 DIAGNOSIS — R51 Headache: Secondary | ICD-10-CM | POA: Insufficient documentation

## 2017-06-25 DIAGNOSIS — R519 Headache, unspecified: Secondary | ICD-10-CM

## 2017-07-01 ENCOUNTER — Other Ambulatory Visit: Payer: Self-pay | Admitting: *Deleted

## 2017-07-01 DIAGNOSIS — R519 Headache, unspecified: Secondary | ICD-10-CM

## 2017-07-01 DIAGNOSIS — R29898 Other symptoms and signs involving the musculoskeletal system: Secondary | ICD-10-CM

## 2017-07-01 DIAGNOSIS — R9089 Other abnormal findings on diagnostic imaging of central nervous system: Secondary | ICD-10-CM

## 2017-07-01 DIAGNOSIS — R51 Headache: Principal | ICD-10-CM

## 2017-07-01 DIAGNOSIS — M542 Cervicalgia: Secondary | ICD-10-CM

## 2017-07-29 ENCOUNTER — Ambulatory Visit (INDEPENDENT_AMBULATORY_CARE_PROVIDER_SITE_OTHER): Payer: Medicare HMO

## 2017-07-29 ENCOUNTER — Ambulatory Visit (INDEPENDENT_AMBULATORY_CARE_PROVIDER_SITE_OTHER): Payer: Medicare HMO | Admitting: Family

## 2017-07-29 ENCOUNTER — Encounter: Payer: Self-pay | Admitting: Family

## 2017-07-29 VITALS — BP 100/60 | HR 80 | Temp 97.6°F | Ht 67.0 in | Wt 215.6 lb

## 2017-07-29 DIAGNOSIS — M542 Cervicalgia: Secondary | ICD-10-CM | POA: Diagnosis not present

## 2017-07-29 DIAGNOSIS — M5412 Radiculopathy, cervical region: Secondary | ICD-10-CM | POA: Diagnosis not present

## 2017-07-29 DIAGNOSIS — M47812 Spondylosis without myelopathy or radiculopathy, cervical region: Secondary | ICD-10-CM | POA: Diagnosis not present

## 2017-07-29 MED ORDER — PREDNISONE 10 MG (21) PO TBPK: ORAL_TABLET | ORAL | 0 refills | Status: DC

## 2017-07-29 MED ORDER — DICLOFENAC SODIUM 75 MG PO TBEC: 75.0000 mg | DELAYED_RELEASE_TABLET | Freq: Two times a day (BID) | ORAL | 0 refills | Status: DC

## 2017-07-29 NOTE — Progress Notes (Signed)
   Subjective:    Patient ID: Daniel Macias, male    DOB: Apr 26, 1950, 67 y.o.   MRN: [id number]  Chief Complaint  Patient presents with  . Neck Pain   PT presents to the office today with intermittent numbness and tingling of his left arm that started several months ago that is becoming worse. Pt reports he has intermittent aching pain in posterior neck of 2-3 out 10.   He had a CT head that was normal on 06/25/17 because he was having headaches and pain behind his left eye. These symptoms have resolved, but his arm pain continues. Neck Pain   This is a recurrent problem. The current episode started 1 to 4 weeks ago. The problem occurs intermittently. The pain is at a severity of 3/10. The pain is mild. Associated symptoms comments: Left arm numbness and tingling that comes and goes. .      Review of Systems  Musculoskeletal: Positive for neck pain.       Objective:   Physical Exam  Constitutional: He is oriented to person, place, and time. He appears well-developed and well-nourished. No distress.  HENT:  Head: Normocephalic.  Right Ear: External ear normal.  Left Ear: External ear normal.  Mouth/Throat: Oropharynx is clear and moist.  Eyes: Pupils are equal, round, and reactive to light. Right eye exhibits no discharge. Left eye exhibits no discharge.  Neck: Normal range of motion. Neck supple. No thyromegaly present.  Cardiovascular: Normal rate, regular rhythm, normal heart sounds and intact distal pulses.  No murmur heard. Pulmonary/Chest: Effort normal and breath sounds normal. No respiratory distress. He has no wheezes.  Abdominal: Soft. Bowel sounds are normal. He exhibits no distension. There is no tenderness.  Musculoskeletal: Normal range of motion. He exhibits no edema or tenderness.  Mild pain in posterior neck with rotation  Neurological: He is alert and oriented to person, place, and time. He has normal reflexes. No cranial nerve deficit.  Skin: Skin is warm  and dry. No rash noted. No erythema.  Psychiatric: He has a normal mood and affect. His behavior is normal. Judgment and thought content normal.  Vitals reviewed.     BP 100/60   Pulse 80   Temp 97.6 F (36.4 C) (Oral)   Ht 5\' 7"  (1.702 m)   Wt 215 lb 9.6 oz (97.8 kg)   BMI 33.77 kg/m      Assessment & Plan:  Daniel Macias was seen today for neck pain.  Diagnoses and all orders for this visit:  Neck pain -     DG Cervical Spine Complete; Future  Cervical radiculopathy -     diclofenac (VOLTAREN) 75 MG EC tablet; Take 1 tablet (75 mg total) by mouth 2 (two) times daily. -     predniSONE (STERAPRED UNI-PAK 21 TAB) 10 MG (21) TBPK tablet; Use as directed   Rest Ice No other NSAID's while taking diclofenac, take with food for the next 7-10 days  Keep follow up with PCP  Evelina Dun, FNP

## 2017-07-29 NOTE — Patient Instructions (Signed)
Cervical Radiculopathy Cervical radiculopathy happens when a nerve in the neck (cervical nerve) is pinched or bruised. This condition can develop because of an injury or as part of the normal aging process. Pressure on the cervical nerves can cause pain or numbness that runs from the neck all the way down into the arm and fingers. Usually, this condition gets better with rest. Treatment may be needed if the condition does not improve. What are the causes? This condition may be caused by:  Injury.  Slipped (herniated) disk.  Muscle tightness in the neck because of overuse.  Arthritis.  Breakdown or degeneration in the bones and joints of the spine (spondylosis) due to aging.  Bone spurs that may develop near the cervical nerves.  What are the signs or symptoms? Symptoms of this condition include:  Pain that runs from the neck to the arm and hand. The pain can be severe or irritating. It may be worse when the neck is moved.  Numbness or weakness in the affected arm and hand.  How is this diagnosed? This condition may be diagnosed based on symptoms, medical history, and a physical exam. You may also have tests, including:  X-rays.  CT scan.  MRI.  Electromyogram (EMG).  Nerve conduction tests.  How is this treated? In many cases, treatment is not needed for this condition. With rest, the condition usually gets better over time. If treatment is needed, options may include:  Wearing a soft neck collar for short periods of time.  Physical therapy to strengthen your neck muscles.  Medicines, such as NSAIDs, oral corticosteroids, or spinal injections.  Surgery. This may be needed if other treatments do not help. Various types of surgery may be done depending on the cause of your problems.  Follow these instructions at home: Managing pain  Take over-the-counter and prescription medicines only as told by your health care provider.  If directed, apply ice to the affected  area. ? Put ice in a plastic bag. ? Place a towel between your skin and the bag. ? Leave the ice on for 20 minutes, 2-3 times per day.  If ice does not help, you can try using heat. Take a warm shower or warm bath, or use a heat pack as told by your health care provider.  Try a gentle neck and shoulder massage to help relieve symptoms. Activity  Rest as needed. Follow instructions from your health care provider about any restrictions on activities.  Do stretching and strengthening exercises as told by your health care provider or physical therapist. General instructions  If you were given a soft collar, wear it as told by your health care provider.  Use a flat pillow when you sleep.  Keep all follow-up visits as told by your health care provider. This is important. Contact a health care provider if:  Your condition does not improve with treatment. Get help right away if:  Your pain gets much worse and cannot be controlled with medicines.  You have weakness or numbness in your hand, arm, face, or leg.  You have a high fever.  You have a stiff, rigid neck.  You lose control of your bowels or your bladder (have incontinence).  You have trouble with walking, balance, or speaking. This information is not intended to replace advice given to you by your health care provider. Make sure you discuss any questions you have with your health care provider. Document Released: 10/24/2000 Document Revised: 07/07/2015 Document Reviewed: 03/25/2014 Elsevier Interactive Patient Education    2018 Elsevier Inc.  

## 2017-08-31 ENCOUNTER — Other Ambulatory Visit: Payer: Self-pay | Admitting: Family

## 2017-08-31 DIAGNOSIS — M5412 Radiculopathy, cervical region: Secondary | ICD-10-CM

## 2017-09-13 DIAGNOSIS — G4733 Obstructive sleep apnea (adult) (pediatric): Secondary | ICD-10-CM | POA: Diagnosis not present

## 2017-10-08 ENCOUNTER — Other Ambulatory Visit: Payer: Self-pay | Admitting: Family Medicine

## 2017-10-09 NOTE — Telephone Encounter (Signed)
OV 10/18/17

## 2017-10-15 ENCOUNTER — Telehealth: Payer: Self-pay | Admitting: Family Medicine

## 2017-10-16 ENCOUNTER — Other Ambulatory Visit: Payer: Self-pay | Admitting: *Deleted

## 2017-10-16 MED ORDER — ROSUVASTATIN CALCIUM 10 MG PO TABS: ORAL_TABLET | ORAL | 0 refills | Status: DC

## 2017-10-16 NOTE — Telephone Encounter (Signed)
Left message for patient to call to schedule a six month recheck. One month refill done on cholesterol medication.

## 2017-10-18 ENCOUNTER — Ambulatory Visit: Payer: Medicare HMO | Admitting: Family Medicine

## 2017-10-22 ENCOUNTER — Encounter: Payer: Self-pay | Admitting: Family Medicine

## 2017-11-19 ENCOUNTER — Encounter: Payer: Medicare HMO | Admitting: Family Medicine

## 2017-11-28 ENCOUNTER — Ambulatory Visit (INDEPENDENT_AMBULATORY_CARE_PROVIDER_SITE_OTHER): Payer: Medicare HMO | Admitting: Family Medicine

## 2017-11-28 ENCOUNTER — Encounter: Payer: Self-pay | Admitting: Family Medicine

## 2017-11-28 VITALS — BP 100/65 | HR 96 | Temp 97.8°F | Ht 67.0 in | Wt 212.0 lb

## 2017-11-28 DIAGNOSIS — E8881 Metabolic syndrome: Secondary | ICD-10-CM

## 2017-11-28 DIAGNOSIS — R000 Tachycardia, unspecified: Secondary | ICD-10-CM | POA: Diagnosis not present

## 2017-11-28 DIAGNOSIS — N400 Enlarged prostate without lower urinary tract symptoms: Secondary | ICD-10-CM | POA: Diagnosis not present

## 2017-11-28 DIAGNOSIS — E7800 Pure hypercholesterolemia, unspecified: Secondary | ICD-10-CM

## 2017-11-28 DIAGNOSIS — E559 Vitamin D deficiency, unspecified: Secondary | ICD-10-CM | POA: Diagnosis not present

## 2017-11-28 DIAGNOSIS — M25572 Pain in left ankle and joints of left foot: Secondary | ICD-10-CM | POA: Diagnosis not present

## 2017-11-28 DIAGNOSIS — G8929 Other chronic pain: Secondary | ICD-10-CM

## 2017-11-28 DIAGNOSIS — I10 Essential (primary) hypertension: Secondary | ICD-10-CM

## 2017-11-28 DIAGNOSIS — Z23 Encounter for immunization: Secondary | ICD-10-CM

## 2017-11-28 MED ORDER — VITAMIN D (ERGOCALCIFEROL) 1.25 MG (50000 UNIT) PO CAPS: ORAL_CAPSULE | ORAL | 3 refills | Status: DC

## 2017-11-28 NOTE — Patient Instructions (Addendum)
Medicare Annual Wellness Visit  [facility] and the medical providers at [facility] strive to bring you the best medical care.  In doing so we not only want to address your current medical conditions and concerns but also to detect new conditions early and prevent illness, disease and health-related problems.    Medicare offers a yearly Wellness Visit which allows our clinical staff to assess your need for preventative services including immunizations, lifestyle education, counseling to decrease risk of preventable diseases and screening for fall risk and other medical concerns.    This visit is provided free of charge (no copay) for all Medicare recipients. The clinical pharmacists at [facility] have begun to conduct these Wellness Visits which will also include a thorough review of all your medications.    As you primary medical provider recommend that you make an appointment for your Annual Wellness Visit if you have not done so already this year.  You may set up this appointment before you leave today or you may call back ([phone]) and schedule an appointment.  Please make sure when you call that you mention that you are scheduling your Annual Wellness Visit with the clinical pharmacist so that the appointment may be made for the proper length of time.     Continue current medications. Continue good therapeutic lifestyle changes which include good diet and exercise. Fall precautions discussed with patient. If an FOBT was given today- please return it to our front desk. If you are over 56 years old - you may need Prevnar 38 or the adult Pneumonia vaccine.  **Flu shots are available--- please call and schedule a FLU-CLINIC appointment**  After your visit with [place] today you will receive a survey in the mail or online from [employer] regarding your care with [place]. Please take a moment to fill this out. Your feedback is very  important to [place] as you can help [place] better understand your patient needs as well as improve your experience and satisfaction. WE CARE ABOUT YOU!!!   Because of the increasing frequency of palpitations, we will arrange for you to have a return visit to Dr. Percival Spanish for further evaluation with recommendations of what may need to be done to limit these palpitations Continue to avoid caffeine Stay well-hydrated We will also arrange for you to see the rehab therapy people next door to have an order to evaluate and treat the left ankle pain and stiffness Stay is active physically as possible

## 2017-11-28 NOTE — Progress Notes (Signed)
Subjective:    Patient ID: Daniel Macias, male    DOB: [birth year], 67 y.o.   MRN: [id number]  HPI Pt here for follow up  and management of chronic medical problems which includes hypertension and hyperlipidemia. He is taking medication regularly.  Daniel Macias is doing well but does have some complaints with his left ankle and some fast heart rate episodes.  He is requesting a refill on his vitamin D.  He will get a PSA along with lab work today and an EKG.  He will also get a urinalysis and a flu shot.  We have recent CT scans that were done of his head and abdomen and a cervical spine film.  We will reviewed all this with him today.  CT of the head was normal for age.  The plain films of the cervical spine showed multilevel osteoarthritic changes with anterior osteophytes and severe disc space narrowing at C3 and 4 C4-5 C5 and 6 C6 and 7 and C7 and T1.  He had a CT of the abdomen in October 2018 and this showed interestingly enough degenerative changes of the spine with disc space disease at several levels but especially at L3 and 4.  In February 2017 he did have an EKG that showed supraventricular tachycardia.  Also saw Dr. Percival Macias in May of that same year.  Patient is up-to-date on his eye checks.  He is having a lot of trouble with the left ankle.  He denies any chest pain pressure tightness or shortness of breath.  He does feel fatigue with the palpitations.  The palpitations may occur daily and last for 1 to 2 minutes.  He denies any trouble with swallowing nausea vomiting diarrhea or blood in the stool or change in bowel habits.  He is still taking Zantac and we told him to switch to Daniel Macias instead.  He is up-to-date on his colonoscopy checks and the last one was done by Dr. Collene Macias in 2018 and he is supposed to get a repeat colonoscopy 3 years later because of polyps that were found.  He is passing his water without problems.  He does not drink a lot of caffeine.  The palpitations are not associated  with this.    Patient Active Problem List   Diagnosis Date Noted  . Palpitation 09/30/2013  . Allergic rhinitis 05/27/2013  . Hypertension 05/27/2013  . Metabolic syndrome 11/10/5745  . BPH (benign prostatic hyperplasia) 11/25/2012  . Obstructive sleep apnea 11/25/2012  . Hyperlipemia 05/28/2012  . Vitamin D deficiency disease 05/28/2012   Outpatient Encounter Medications as of 11/28/2017  Medication Sig  . azelastine (ASTELIN) 0.1 % nasal spray Place 2 sprays into both nostrils at bedtime. Use in each nostril as directed  . diclofenac (VOLTAREN) 75 MG EC tablet TAKE 1 TABLET BY MOUTH TWICE A DAY  . fluticasone (FLONASE) 50 MCG/ACT nasal spray Place 2 sprays into both nostrils daily as needed.  Marland Kitchen losartan (COZAAR) 100 MG tablet TAKE 1 TABLET (100 MG TOTAL) BY MOUTH DAILY.  Marland Kitchen predniSONE (STERAPRED UNI-PAK 21 TAB) 10 MG (21) TBPK tablet Use as directed  . rosuvastatin (CRESTOR) 10 MG tablet TAKE 1 TABLET BY MOUTH EVERY DAY  . Vitamin D, Ergocalciferol, (DRISDOL) 50000 units CAPS capsule TAKE 1 CAPSULE EVERY 7 DAYS   No facility-administered encounter medications on file as of 11/28/2017.       Review of Systems  Constitutional: Negative.   HENT: Negative.   Eyes: Negative.   Respiratory: Negative.  Cardiovascular: Negative.        Increased episodes of tachy.  Gastrointestinal: Negative.   Endocrine: Negative.   Genitourinary: Negative.   Musculoskeletal: Positive for arthralgias (left ankle - not very mobile ).  Skin: Negative.   Allergic/Immunologic: Negative.   Neurological: Negative.   Hematological: Negative.   Psychiatric/Behavioral: Negative.        Objective:   Physical Exam  Constitutional: He is oriented to person, place, and time. He appears well-developed and well-nourished. No distress.  Patient is pleasant and relaxed  HENT:  Head: Normocephalic and atraumatic.  Right Ear: External ear normal.  Left Ear: External ear normal.  Nose: Nose normal.    Mouth/Throat: Oropharynx is clear and moist. No oropharyngeal exudate.  Eyes: Pupils are equal, round, and reactive to light. Conjunctivae and EOM are normal. Right eye exhibits no discharge. Left eye exhibits no discharge. No scleral icterus.  To date on eye exams  Neck: Normal range of motion. Neck supple. No thyromegaly present.  No bruits thyromegaly or anterior cervical adenopathy  Cardiovascular: Normal rate, regular rhythm, normal heart sounds and intact distal pulses.  No murmur heard. Heart is regular at 84/min  Pulmonary/Chest: Effort normal and breath sounds normal. He has no wheezes. He has no rales. He exhibits no tenderness.  Clear anteriorly and posteriorly no axillary adenopathy chest wall masses  Abdominal: Soft. Bowel sounds are normal. He exhibits no mass. There is no tenderness.  The abdomen is soft with mild obesity with no liver or spleen enlargement bruits masses or inguinal adenopathy  Genitourinary: Rectum normal and penis normal.  Genitourinary Comments: Prostate is enlarged and soft without lumps or masses.  There were no rectal masses.  The external genitalia were within normal limits and no inguinal hernias were palpable and no inguinal adenopathy.  Musculoskeletal: He exhibits deformity. He exhibits no edema.  Deformity of left ankle with limited movement of flexion and extension.  Lymphadenopathy:    He has no cervical adenopathy.  Neurological: He is alert and oriented to person, place, and time. He has normal reflexes. No cranial nerve deficit.  Reflexes are 2+ and equal bilaterally  Skin: Skin is warm and dry. No rash noted.  Psychiatric: He has a normal mood and affect. His behavior is normal. Judgment and thought content normal.  Patient's mood affect and behavior are all normal.  Nursing note and vitals reviewed.  BP 100/65 (BP Location: Left Arm)   Pulse 96   Temp 97.8 F (36.6 C) (Oral)   Ht '5\' 7"'  (1.702 m)   Wt 212 lb (96.2 kg)   BMI 33.20  kg/m         Assessment & Plan:  1. Essential hypertension -Blood pressure is excellent today.  He will continue with his losartan. - CBC with Differential/Platelet - BMP8+EGFR - Hepatic function panel - EKG 12-Lead  2. Pure hypercholesterolemia -Continue with Crestor and as aggressive therapeutic lifestyle changes as possible pending results of lab work - CBC with Differential/Platelet - Lipid panel - EKG 12-Lead  3. Vitamin D deficiency disease -Continue with vitamin D replacement pending results of lab work - CBC with Differential/Platelet - VITAMIN D 25 Hydroxy (Vit-D Deficiency, Fractures)  4. Benign prostatic hyperplasia, unspecified whether lower urinary tract symptoms present -Prostate is enlarged but soft and smooth.  He is having no symptoms with this. - CBC with Differential/Platelet - PSA, total and free - Urinalysis, Complete  5. Metabolic syndrome -Continue with aggressive therapeutic lifestyle changes including diet and exercise  to achieve weight loss - CBC with Differential/Platelet  6. Tachycardia -Appointment with cardiology for follow-up visit because of increased frequency of palpitations - EKG 12-Lead  7. Chronic pain of left ankle -Refer to physical therapy.  Patient has history of fracture of tibia and fibula with screws that have subsequently been removed but with increased stiffness and rigidity and pain  8. Encounter for immunization - Flu vaccine HIGH DOSE PF  Meds ordered this encounter  Medications  . Vitamin D, Ergocalciferol, (DRISDOL) 50000 units CAPS capsule    Sig: TAKE 1 CAPSULE EVERY 7 DAYS    Dispense:  12 capsule    Refill:  3   Patient Instructions                       Medicare Annual Wellness Visit  [facility] and the medical providers at [facility] strive to bring you the best medical care.  In doing so we not only want to address your current medical conditions and concerns but also to detect  new conditions early and prevent illness, disease and health-related problems.    Medicare offers a yearly Wellness Visit which allows our clinical staff to assess your need for preventative services including immunizations, lifestyle education, counseling to decrease risk of preventable diseases and screening for fall risk and other medical concerns.    This visit is provided free of charge (no copay) for all Medicare recipients. The clinical pharmacists at [facility] have begun to conduct these Wellness Visits which will also include a thorough review of all your medications.    As you primary medical provider recommend that you make an appointment for your Annual Wellness Visit if you have not done so already this year.  You may set up this appointment before you leave today or you may call back ([phone]) and schedule an appointment.  Please make sure when you call that you mention that you are scheduling your Annual Wellness Visit with the clinical pharmacist so that the appointment may be made for the proper length of time.     Continue current medications. Continue good therapeutic lifestyle changes which include good diet and exercise. Fall precautions discussed with patient. If an FOBT was given today- please return it to our front desk. If you are over 34 years old - you may need Prevnar 23 or the adult Pneumonia vaccine.  **Flu shots are available--- please call and schedule a FLU-CLINIC appointment**  After your visit with [place] today you will receive a survey in the mail or online from [employer] regarding your care with [place]. Please take a moment to fill this out. Your feedback is very important to [place] as you can help [place] better understand your patient needs as well as improve your experience and satisfaction. WE CARE ABOUT YOU!!!   Because of the increasing frequency of palpitations, we will arrange for you to have a return visit to Dr. Percival Macias for further  evaluation with recommendations of what may need to be done to limit these palpitations Continue to avoid caffeine Stay well-hydrated We will also arrange for you to see the rehab therapy people next door to have an order to evaluate and treat the left ankle pain and stiffness Stay is active physically as possible  Arrie Senate MD

## 2017-11-29 DIAGNOSIS — L578 Other skin changes due to chronic exposure to nonionizing radiation: Secondary | ICD-10-CM | POA: Diagnosis not present

## 2017-11-29 DIAGNOSIS — Z85828 Personal history of other malignant neoplasm of skin: Secondary | ICD-10-CM | POA: Diagnosis not present

## 2017-11-29 DIAGNOSIS — L821 Other seborrheic keratosis: Secondary | ICD-10-CM | POA: Diagnosis not present

## 2017-11-29 DIAGNOSIS — L812 Freckles: Secondary | ICD-10-CM | POA: Diagnosis not present

## 2017-11-29 DIAGNOSIS — L570 Actinic keratosis: Secondary | ICD-10-CM | POA: Diagnosis not present

## 2017-11-29 LAB — CBC WITH DIFFERENTIAL/PLATELET
Basophils Absolute: 0 10*3/uL (ref 0.0–0.2)
Basos: 1 %
EOS (ABSOLUTE): 0.1 10*3/uL (ref 0.0–0.4)
Eos: 1 %
Hematocrit: 41.4 % (ref 37.5–51.0)
Hemoglobin: 14.1 g/dL (ref 13.0–17.7)
Immature Grans (Abs): 0 10*3/uL (ref 0.0–0.1)
Immature Granulocytes: 0 %
LYMPHS ABS: 2 10*3/uL (ref 0.7–3.1)
Lymphs: 25 %
MCH: 28.9 pg (ref 26.6–33.0)
MCHC: 34.1 g/dL (ref 31.5–35.7)
MCV: 85 fL (ref 79–97)
MONOS ABS: 0.6 10*3/uL (ref 0.1–0.9)
Monocytes: 7 %
NEUTROS PCT: 66 %
Neutrophils Absolute: 5.3 10*3/uL (ref 1.4–7.0)
PLATELETS: 306 10*3/uL (ref 150–450)
RBC: 4.88 x10E6/uL (ref 4.14–5.80)
RDW: 12.7 % (ref 12.3–15.4)
WBC: 8.1 10*3/uL (ref 3.4–10.8)

## 2017-11-29 LAB — BMP8+EGFR
BUN/Creatinine Ratio: 16 (ref 10–24)
BUN: 16 mg/dL (ref 8–27)
CHLORIDE: 101 mmol/L (ref 96–106)
CO2: 24 mmol/L (ref 20–29)
Calcium: 9.5 mg/dL (ref 8.6–10.2)
Creatinine, Ser: 1.03 mg/dL (ref 0.76–1.27)
GFR, EST AFRICAN AMERICAN: 85 mL/min/{1.73_m2} (ref >59)
GFR, EST NON AFRICAN AMERICAN: 74 mL/min/{1.73_m2} (ref >59)
Glucose: 91 mg/dL (ref 65–99)
POTASSIUM: 4.4 mmol/L (ref 3.5–5.2)
SODIUM: 141 mmol/L (ref 134–144)

## 2017-11-29 LAB — HEPATIC FUNCTION PANEL
ALT: 21 IU/L (ref 0–44)
AST: 23 IU/L (ref 0–40)
Albumin: 4.8 g/dL (ref 3.6–4.8)
Alkaline Phosphatase: 82 IU/L (ref 39–117)
BILIRUBIN, DIRECT: 0.18 mg/dL (ref 0.00–0.40)
Bilirubin Total: 0.5 mg/dL (ref 0.0–1.2)
TOTAL PROTEIN: 7.7 g/dL (ref 6.0–8.5)

## 2017-11-29 LAB — LIPID PANEL
Chol/HDL Ratio: 2.5 ratio (ref 0.0–5.0)
Cholesterol, Total: 142 mg/dL (ref 100–199)
HDL: 57 mg/dL (ref >39)
LDL Calculated: 68 mg/dL (ref 0–99)
TRIGLYCERIDES: 85 mg/dL (ref 0–149)
VLDL Cholesterol Cal: 17 mg/dL (ref 5–40)

## 2017-11-29 LAB — PSA, TOTAL AND FREE
PROSTATE SPECIFIC AG, SERUM: 1 ng/mL (ref 0.0–4.0)
PSA FREE PCT: 40 %
PSA, Free: 0.4 ng/mL

## 2017-11-29 LAB — VITAMIN D 25 HYDROXY (VIT D DEFICIENCY, FRACTURES): Vit D, 25-Hydroxy: 30.2 ng/mL (ref 30.0–100.0)

## 2017-12-01 ENCOUNTER — Encounter: Payer: Self-pay | Admitting: Family Medicine

## 2017-12-01 DIAGNOSIS — E039 Hypothyroidism, unspecified: Secondary | ICD-10-CM | POA: Insufficient documentation

## 2017-12-01 LAB — THYROID PANEL WITH TSH
Free Thyroxine Index: 1.6 (ref 1.2–4.9)
T3 Uptake Ratio: 23 % — ABNORMAL LOW (ref 24–39)
T4 TOTAL: 7 ug/dL (ref 4.5–12.0)
TSH: 5.6 u[IU]/mL — AB (ref 0.450–4.500)

## 2017-12-01 LAB — SPECIMEN STATUS REPORT

## 2017-12-03 ENCOUNTER — Other Ambulatory Visit: Payer: Self-pay | Admitting: *Deleted

## 2017-12-03 DIAGNOSIS — E039 Hypothyroidism, unspecified: Secondary | ICD-10-CM

## 2017-12-03 MED ORDER — LEVOTHYROXINE SODIUM 25 MCG PO TABS: 25.0000 ug | ORAL_TABLET | Freq: Every day | ORAL | 1 refills | Status: DC

## 2017-12-09 ENCOUNTER — Ambulatory Visit: Payer: Medicare HMO | Attending: Family Medicine | Admitting: Physical Therapy

## 2017-12-09 DIAGNOSIS — M25672 Stiffness of left ankle, not elsewhere classified: Secondary | ICD-10-CM | POA: Diagnosis not present

## 2017-12-09 DIAGNOSIS — M25572 Pain in left ankle and joints of left foot: Secondary | ICD-10-CM | POA: Diagnosis not present

## 2017-12-09 NOTE — Therapy (Signed)
[facility] [place], [place], [address] Phone: [phone]   Fax:  [phone]  Physical Therapy Treatment  Patient Details  Name: Daniel Macias MRN: [id number] Date of Birth: 11/22/1950 Referring Provider (PT): Redge Gainer MD.   Encounter Date: 12/09/2017  PT End of Session - 12/09/17 1246    Visit Number  1    Number of Visits  12    Date for PT Re-Evaluation  03/09/18    PT Start Time  0954    PT Stop Time  1034    PT Time Calculation (min)  40 min    Activity Tolerance  Patient tolerated treatment well    Behavior During Therapy  [facility] for tasks assessed/performed       Past Medical History:  Diagnosis Date  . Allergy   . Carpal tunnel syndrome, bilateral   . Hx of colonic polyps   . Hyperlipidemia   . Hypertension   . Lower leg fracture   . Sleep apnea    CPAP  . Vitamin D deficiency     Past Surgical History:  Procedure Laterality Date  . CYST REMOVAL HAND Right    wrist   . LEG SURGERY Left 1971  . [place]   DDD    There were no vitals filed for this visit.  Subjective Assessment - 12/09/17 1250    Subjective  The patient presents to the clinic today with c/o chronic left ankle pain.  He reports he can not walk up inclines well due to lack of movement in his ankle.  His pain is a 6/10 today but there are times when he will get a sudden severe sharp pain in his ankle.  Pain reduces when he "stays off it."      Pertinent History  Left ankle fracture and surgery in 1971; back surgery in 1999; bilateral carpal tunnel surgery.    Patient Stated Goals  Walk up inclines with less pain.    Currently in Pain?  Yes    Pain Score  6     Pain Location  Ankle    Pain Orientation  Left    Pain Descriptors / Indicators  Aching;Sharp    Pain Type  Chronic pain    Pain Onset  More than a month ago    Pain Frequency  Constant    Aggravating Factors   See above.    Pain Relieving Factors  See  above.         [facility] PT Assessment - 12/09/17 0001      Assessment   Medical Diagnosis  Chronic pain of left ankle.    Referring Provider (PT)  Redge Gainer MD.    Onset Date/Surgical Date  --   Ongoing.     Precautions   Precautions  None      Restrictions   Weight Bearing Restrictions  No      Balance Screen   Has the patient fallen in the past 6 months  No    Has the patient had a decrease in activity level because of a fear of falling?   No    Is the patient reluctant to leave their home because of a fear of falling?   No      Home Environment   Living Environment  Private residence      Prior Function   Level of Independence  Independent      ROM / Strength   AROM /  PROM / Strength  AROM;Strength      AROM   Overall AROM Comments  Active left ankle dorsiflexion is limited to 0 degrees.  Other motions are full.      Strength   Overall Strength Comments  Normal left ankle strength.      Palpation   Palpation comment  CC is pain in left anterior ankle region (talocrural joint).      Ambulation/Gait   Gait Comments  Gait remarkable for lack of left ankle dorsiflexion.                   [facility] Adult PT Treatment/Exercise - 12/09/17 0001      Modalities   Modalities  Electrical Stimulation;Moist Heat      Moist Heat Therapy   Number Minutes Moist Heat  15 Minutes    Moist Heat Location  --   Left anterior ankle.     Electrical Stimulation   Electrical Stimulation Location  Left anterior ankle.    Electrical Stimulation Action  Pre-mod.    Electrical Stimulation Parameters  80-150 Hz x 15 minutes.    Electrical Stimulation Goals  Pain               PT Short Term Goals - 12/09/17 1337      PT SHORT TERM GOAL #1   Title  STG's=LTG's.        PT Long Term Goals - 12/09/17 1337      PT LONG TERM GOAL #1   Title  Independent with a HEP.    Time  6    Period  Weeks    Status  New      PT LONG TERM GOAL #2   Title  Active left  ankle dorsiflexion= 5 degrees to help normalize gait pattern.    Time  6    Period  Weeks    Status  New      PT LONG TERM GOAL #3   Title  Patient able to walk up incline and/or uneven terrain with pain not > 2-3/10.    Time  6    Period  Weeks    Status  New            Plan - 12/09/17 1331    Clinical Impression Statement  The patient presents to OPPT with c/o left ankle pain.  He has a loss of left ankle dorsiflexion which limites his ability to walk up inclines and navigating uneven terrain is challenging.  Patient will benefit from skilled physical therapy intervention to address deficits and pain.     History and Personal Factors relevant to plan of care:  Left ankle fracture and surgery in 1971; back surgery in 1999; bilateral carpal tunnel surgery.    Clinical Presentation  Evolving    Clinical Presentation due to:  Not improving.    Clinical Decision Making  Low    Rehab Potential  Good    PT Frequency  2x / week    PT Duration  6 weeks    PT Treatment/Interventions  ADLs/Self Care Home Management;Electrical Stimulation;Cryotherapy;Ultrasound;Therapeutic exercise;Therapeutic activities;Functional mobility training;Patient/family education;Passive range of motion;Manual techniques;Vasopneumatic Device    PT Next Visit Plan  Rockerboard; standing left calf stretching; PROM; ankle isolator; modalites PRN.    Consulted and Agree with Plan of Care  Patient       Patient will benefit from skilled therapeutic intervention in order to improve the following deficits and impairments:  Abnormal gait, Decreased activity tolerance,  Decreased range of motion, Pain  Visit Diagnosis: Pain in left ankle and joints of left foot - Plan: PT plan of care cert/re-cert  Stiffness of left ankle, not elsewhere classified - Plan: PT plan of care cert/re-cert     Problem List Patient Active Problem List   Diagnosis Date Noted  . Hypothyroidism (acquired) 12/01/2017  . Palpitation  09/30/2013  . Allergic rhinitis 05/27/2013  . Hypertension 05/27/2013  . Metabolic syndrome 38/25/0539  . BPH (benign prostatic hyperplasia) 11/25/2012  . Obstructive sleep apnea 11/25/2012  . Hyperlipemia 05/28/2012  . Vitamin D deficiency disease 05/28/2012    Ajeenah Heiny, [place] MPT 12/09/2017, 1:40 PM  [facility] [address] [place], [place], [address] Phone: [phone]   Fax:  [phone]  Name: Daniel Macias MRN: [id number] Date of Birth: Jul 10, 1950

## 2017-12-12 ENCOUNTER — Encounter: Payer: Self-pay | Admitting: Physical Therapy

## 2017-12-12 ENCOUNTER — Ambulatory Visit: Payer: Medicare HMO | Admitting: Physical Therapy

## 2017-12-12 DIAGNOSIS — M25572 Pain in left ankle and joints of left foot: Secondary | ICD-10-CM | POA: Diagnosis not present

## 2017-12-12 DIAGNOSIS — M25672 Stiffness of left ankle, not elsewhere classified: Secondary | ICD-10-CM

## 2017-12-12 NOTE — Therapy (Signed)
[facility] [place], [place], [address] Phone: [phone]   Fax:  [phone]  Physical Therapy Treatment  Patient Details  Name: Daniel Macias MRN: [id number] Date of Birth: 1950-09-14 Referring Provider (PT): Redge Gainer MD.   Encounter Date: 12/12/2017  PT End of Session - 12/12/17 0942    Visit Number  2    Number of Visits  12    Date for PT Re-Evaluation  03/09/18    PT Start Time  0901    PT Stop Time  0952    PT Time Calculation (min)  51 min    Activity Tolerance  Patient tolerated treatment well    Behavior During Therapy  [facility] for tasks assessed/performed       Past Medical History:  Diagnosis Date  . Allergy   . Carpal tunnel syndrome, bilateral   . Hx of colonic polyps   . Hyperlipidemia   . Hypertension   . Lower leg fracture   . Sleep apnea    CPAP  . Vitamin D deficiency     Past Surgical History:  Procedure Laterality Date  . CYST REMOVAL HAND Right    wrist   . LEG SURGERY Left 1971  . [place]   DDD    There were no vitals filed for this visit.  Subjective Assessment - 12/12/17 0905    Subjective  Patient arrived a lot of pain "11/10 pain" in ankle    Pertinent History  Left ankle fracture and surgery in 1971; back surgery in 1999; bilateral carpal tunnel surgery.    Patient Stated Goals  Walk up inclines with less pain.    Currently in Pain?  Yes    Pain Score  10-Worst pain ever    Pain Location  Ankle    Pain Orientation  Left;Anterior    Pain Descriptors / Indicators  Aching;Discomfort    Pain Type  Chronic pain    Pain Onset  More than a month ago    Pain Frequency  Constant    Aggravating Factors   uneven surface or up incline    Pain Relieving Factors  at rest                       [facility] Adult PT Treatment/Exercise - 12/12/17 0001      Exercises   Exercises  Ankle      Moist Heat Therapy   Number Minutes Moist Heat  15 Minutes    Moist Heat Location  Ankle      Electrical Stimulation   Electrical Stimulation Location  Left anterior ankle.    Child psychotherapist Parameters  80-150hz  x61min    Electrical Stimulation Goals  Pain      Manual Therapy   Manual Therapy  Soft tissue mobilization;Passive ROM    Manual therapy comments  manual STW to ant ankle to reduce pain and tone    Passive ROM  gentle PROM/stretching for left ankle DF to improve range      Ankle Exercises: Aerobic   Nustep  40min L5 seat 9      Ankle Exercises: Standing   Rocker Board  3 minutes      Ankle Exercises: Seated   Other Seated Ankle Exercises  Ankle Isolator 1# x30 DF      Ankle Exercises: Supine   T-Band  2x10 4 way yellow band  PT Short Term Goals - 12/09/17 1337      PT SHORT TERM GOAL #1   Title  STG's=LTG's.        PT Long Term Goals - 12/09/17 1337      PT LONG TERM GOAL #1   Title  Independent with a HEP.    Time  6    Period  Weeks    Status  New      PT LONG TERM GOAL #2   Title  Active left ankle dorsiflexion= 5 degrees to help normalize gait pattern.    Time  6    Period  Weeks    Status  New      PT LONG TERM GOAL #3   Title  Patient able to walk up incline and/or uneven terrain with pain not > 2-3/10.    Time  6    Period  Weeks    Status  New            Plan - 12/12/17 0943    Clinical Impression Statement  Patient tolerated treatment well today with some discomfort yet down to 3-4/10 post treatment. Patient able to progres with exercises to improve ROM and strengthening to stablize anle. Performed STW to ant ankle area of most pain is. Patient goals progressing at this time. Will progress per tolerance and per POC.     Rehab Potential  Good    PT Frequency  2x / week    PT Duration  6 weeks    PT Treatment/Interventions  ADLs/Self Care Home Management;Electrical Stimulation;Cryotherapy;Ultrasound;Therapeutic  exercise;Therapeutic activities;Functional mobility training;Patient/family education;Passive range of motion;Manual techniques;Vasopneumatic Device    PT Next Visit Plan  cont with Rockerboard; standing left calf stretching; PROM; ankle isolator; STW/modalites PRN.    Consulted and Agree with Plan of Care  Patient       Patient will benefit from skilled therapeutic intervention in order to improve the following deficits and impairments:  Abnormal gait, Decreased activity tolerance, Decreased range of motion, Pain  Visit Diagnosis: Pain in left ankle and joints of left foot  Stiffness of left ankle, not elsewhere classified     Problem List Patient Active Problem List   Diagnosis Date Noted  . Hypothyroidism (acquired) 12/01/2017  . Palpitation 09/30/2013  . Allergic rhinitis 05/27/2013  . Hypertension 05/27/2013  . Metabolic syndrome 79/89/2119  . BPH (benign prostatic hyperplasia) 11/25/2012  . Obstructive sleep apnea 11/25/2012  . Hyperlipemia 05/28/2012  . Vitamin D deficiency disease 05/28/2012    Phillips Climes, PTA 12/12/2017, 9:54 AM  [facility] [place], [place], [address] Phone: [phone]   Fax:  [phone]  Name: Daniel Macias MRN: [id number] Date of Birth: 1950-07-07

## 2017-12-16 ENCOUNTER — Encounter: Payer: Self-pay | Admitting: Physical Therapy

## 2017-12-16 ENCOUNTER — Ambulatory Visit: Payer: Medicare HMO | Attending: Family Medicine | Admitting: Physical Therapy

## 2017-12-16 DIAGNOSIS — M25572 Pain in left ankle and joints of left foot: Secondary | ICD-10-CM | POA: Diagnosis not present

## 2017-12-16 DIAGNOSIS — M25672 Stiffness of left ankle, not elsewhere classified: Secondary | ICD-10-CM | POA: Diagnosis not present

## 2017-12-16 NOTE — Therapy (Signed)
[facility] [place], [place], [address] Phone: [phone]   Fax:  [phone]  Physical Therapy Treatment  Patient Details  Name: Daniel Macias MRN: [id number] Date of Birth: [birth year] Referring Provider (PT): Redge Gainer MD.   Encounter Date: 12/16/2017  PT End of Session - 12/16/17 0904    Visit Number  3    Number of Visits  12    Date for PT Re-Evaluation  03/09/18    PT Start Time  2992   late arrival   PT Stop Time  0914    PT Time Calculation (min)  31 min    Activity Tolerance  Patient tolerated treatment well    Behavior During Therapy  [facility] for tasks assessed/performed       Past Medical History:  Diagnosis Date  . Allergy   . Carpal tunnel syndrome, bilateral   . Hx of colonic polyps   . Hyperlipidemia   . Hypertension   . Lower leg fracture   . Sleep apnea    CPAP  . Vitamin D deficiency     Past Surgical History:  Procedure Laterality Date  . CYST REMOVAL HAND Right    wrist   . LEG SURGERY Left 1971  . [place]   DDD    There were no vitals filed for this visit.  Subjective Assessment - 12/16/17 0904    Subjective  Patient reports last treatment really helped and pain is at a 3/10.    Pertinent History  Left ankle fracture and surgery in 1971; back surgery in 1999; bilateral carpal tunnel surgery.    Patient Stated Goals  Walk up inclines with less pain.    Currently in Pain?  Yes    Pain Score  3     Pain Orientation  Left;Anterior    Pain Descriptors / Indicators  Aching;Discomfort    Pain Type  Chronic pain    Pain Onset  More than a month ago    Pain Frequency  Constant         [facility] PT Assessment - 12/16/17 0001      Assessment   Medical Diagnosis  Chronic pain of left ankle.    Referring Provider (PT)  Redge Gainer MD.      Precautions   Precautions  None      Restrictions   Weight Bearing Restrictions  No                   [facility] Adult  PT Treatment/Exercise - 12/16/17 0001      Exercises   Exercises  Ankle      Moist Heat Therapy   Number Minutes Moist Heat  10 Minutes    Moist Heat Location  Ankle      Electrical Stimulation   Electrical Stimulation Location  left anterior ankle    Electrical Stimulation Action  pre-mod    Electrical Stimulation Parameters  80-150 hz x10 min    Electrical Stimulation Goals  Pain      Manual Therapy   Manual Therapy  Soft tissue mobilization;Passive ROM;Joint mobilization    Manual therapy comments  manual STW to ant ankle to reduce pain and tone    Joint Mobilization  Grade I posterior talocrural joint mobilizations to decrease pain and improve movement      Ankle Exercises: Standing   Rocker Board  3 minutes    Heel Raises  Both;20 reps    Toe Raise  5 reps               PT Short Term Goals - 12/09/17 1337      PT SHORT TERM GOAL #1   Title  STG's=LTG's.        PT Long Term Goals - 12/09/17 1337      PT LONG TERM GOAL #1   Title  Independent with a HEP.    Time  6    Period  Weeks    Status  New      PT LONG TERM GOAL #2   Title  Active left ankle dorsiflexion= 5 degrees to help normalize gait pattern.    Time  6    Period  Weeks    Status  New      PT LONG TERM GOAL #3   Title  Patient able to walk up incline and/or uneven terrain with pain not > 2-3/10.    Time  6    Period  Weeks    Status  New            Plan - 12/16/17 1246    Clinical Impression Statement  Patient was able to tolerate treatment well with exception of standing toe raises. Patient reported 10/10 pain with 5 reps of exercise therefore exercise was terminated. Patient was able to tolerate STW/M as well as grade I posterior talocrual joint mobs. Patient reported decrease of pain at end of modalities,     Clinical Presentation  Evolving    Clinical Decision Making  Low    Rehab Potential  Good    PT Frequency  2x / week    PT Duration  6 weeks    PT  Treatment/Interventions  ADLs/Self Care Home Management;Electrical Stimulation;Cryotherapy;Ultrasound;Therapeutic exercise;Therapeutic activities;Functional mobility training;Patient/family education;Passive range of motion;Manual techniques;Vasopneumatic Device    PT Next Visit Plan  cont with Rockerboard; standing left calf stretching; PROM; ankle isolator; STW/modalites PRN.    Consulted and Agree with Plan of Care  Patient       Patient will benefit from skilled therapeutic intervention in order to improve the following deficits and impairments:  Abnormal gait, Decreased activity tolerance, Decreased range of motion, Pain  Visit Diagnosis: Pain in left ankle and joints of left foot  Stiffness of left ankle, not elsewhere classified     Problem List Patient Active Problem List   Diagnosis Date Noted  . Hypothyroidism (acquired) 12/01/2017  . Palpitation 09/30/2013  . Allergic rhinitis 05/27/2013  . Hypertension 05/27/2013  . Metabolic syndrome 03/54/6568  . BPH (benign prostatic hyperplasia) 11/25/2012  . Obstructive sleep apnea 11/25/2012  . Hyperlipemia 05/28/2012  . Vitamin D deficiency disease 05/28/2012   Gabriela Eves, PT, DPT 12/16/2017, 2:24 PM  [facility] [address] [place], [place], [address] Phone: [phone]   Fax:  [phone]  Name: Daniel Macias MRN: [id number] Date of Birth: 09-22-1950

## 2017-12-18 ENCOUNTER — Encounter: Payer: PRIVATE HEALTH INSURANCE | Admitting: Physical Therapy

## 2017-12-19 ENCOUNTER — Ambulatory Visit: Payer: Medicare HMO | Admitting: *Deleted

## 2017-12-19 DIAGNOSIS — M25572 Pain in left ankle and joints of left foot: Secondary | ICD-10-CM

## 2017-12-19 DIAGNOSIS — M25672 Stiffness of left ankle, not elsewhere classified: Secondary | ICD-10-CM

## 2017-12-19 NOTE — Therapy (Signed)
[facility] [place], [place], [address] Phone: [phone]   Fax:  [phone]  Physical Therapy Treatment  Patient Details  Name: Daniel Macias MRN: [id number] Date of Birth: 08/15/1950 Referring Provider (PT): Redge Gainer MD.   Encounter Date: 12/19/2017  PT End of Session - 12/19/17 0818    Visit Number  4    Number of Visits  12    Date for PT Re-Evaluation  03/09/18    PT Start Time  0815    PT Stop Time  0902    PT Time Calculation (min)  47 min       Past Medical History:  Diagnosis Date  . Allergy   . Carpal tunnel syndrome, bilateral   . Hx of colonic polyps   . Hyperlipidemia   . Hypertension   . Lower leg fracture   . Sleep apnea    CPAP  . Vitamin D deficiency     Past Surgical History:  Procedure Laterality Date  . CYST REMOVAL HAND Right    wrist   . LEG SURGERY Left 1971  . [place]   DDD    There were no vitals filed for this visit.                    [facility] Adult PT Treatment/Exercise - 12/19/17 0001      Exercises   Exercises  Ankle      Moist Heat Therapy   Number Minutes Moist Heat  15 Minutes    Moist Heat Location  Ankle      Electrical Stimulation   Electrical Stimulation Location  left anterior ankle  premod x 15 mins 80-150hz     Electrical Stimulation Goals  Pain      Manual Therapy   Manual Therapy  Soft tissue mobilization;Passive ROM;Joint mobilization    Manual therapy comments  manual STW to ant ankle to reduce pain and tone    Joint Mobilization  Grade I posterior talocrural joint mobilizations and traction to decrease pain and improve movement    Passive ROM  gentle PROM/stretching for left ankle DF to improve range and manual  traction      Ankle Exercises: Standing   Rocker Board  5 minutes               PT Short Term Goals - 12/09/17 1337      PT SHORT TERM GOAL #1   Title  STG's=LTG's.        PT Long Term  Goals - 12/09/17 1337      PT LONG TERM GOAL #1   Title  Independent with a HEP.    Time  6    Period  Weeks    Status  New      PT LONG TERM GOAL #2   Title  Active left ankle dorsiflexion= 5 degrees to help normalize gait pattern.    Time  6    Period  Weeks    Status  New      PT LONG TERM GOAL #3   Title  Patient able to walk up incline and/or uneven terrain with pain not > 2-3/10.    Time  6    Period  Weeks    Status  New            Plan - 12/19/17 2409    Clinical Impression Statement  Pt arrived today doing fair, but still with C/O pain dorsal  aspect with DF. Rx focused on DF ROM on rocker board , and manual STW/ as well as PROM and traction. Normal modality response after Rx.    Clinical Presentation  Evolving    Rehab Potential  Good    PT Treatment/Interventions  ADLs/Self Care Home Management;Electrical Stimulation;Cryotherapy;Ultrasound;Therapeutic exercise;Therapeutic activities;Functional mobility training;Patient/family education;Passive range of motion;Manual techniques;Vasopneumatic Device    PT Next Visit Plan  cont with Rockerboard; standing left calf stretching; PROM / traction; ankle isolator; STW/modalites PRN.    Consulted and Agree with Plan of Care  Patient       Patient will benefit from skilled therapeutic intervention in order to improve the following deficits and impairments:  Abnormal gait, Decreased activity tolerance, Decreased range of motion, Pain  Visit Diagnosis: Pain in left ankle and joints of left foot  Stiffness of left ankle, not elsewhere classified     Problem List Patient Active Problem List   Diagnosis Date Noted  . Hypothyroidism (acquired) 12/01/2017  . Palpitation 09/30/2013  . Allergic rhinitis 05/27/2013  . Hypertension 05/27/2013  . Metabolic syndrome 74/09/1446  . BPH (benign prostatic hyperplasia) 11/25/2012  . Obstructive sleep apnea 11/25/2012  . Hyperlipemia 05/28/2012  . Vitamin D deficiency disease  05/28/2012    Christapher Gillian,CHRIS, PTA 12/19/2017, 9:58 AM  [facility] [address] [place], [place], [address] Phone: [phone]   Fax:  [phone]  Name: Daniel Macias MRN: [id number] Date of Birth: [birth year]

## 2017-12-24 ENCOUNTER — Ambulatory Visit: Payer: Medicare HMO | Admitting: Physical Therapy

## 2017-12-24 ENCOUNTER — Encounter: Payer: Self-pay | Admitting: Physical Therapy

## 2017-12-24 DIAGNOSIS — M25572 Pain in left ankle and joints of left foot: Secondary | ICD-10-CM

## 2017-12-24 DIAGNOSIS — M25672 Stiffness of left ankle, not elsewhere classified: Secondary | ICD-10-CM | POA: Diagnosis not present

## 2017-12-24 NOTE — Therapy (Signed)
[facility] [place], [place], [address] Phone: [phone]   Fax:  [phone]  Physical Therapy Treatment  Patient Details  Name: Daniel Macias MRN: [id number] Date of Birth: [birth year] Referring Provider (PT): Redge Gainer MD.   Encounter Date: 12/24/2017  PT End of Session - 12/24/17 0908    Visit Number  5    Number of Visits  12    Date for PT Re-Evaluation  03/09/18    PT Start Time  0902    PT Stop Time  0951    PT Time Calculation (min)  49 min    Activity Tolerance  Patient tolerated treatment well    Behavior During Therapy  [facility] for tasks assessed/performed       Past Medical History:  Diagnosis Date  . Allergy   . Carpal tunnel syndrome, bilateral   . Hx of colonic polyps   . Hyperlipidemia   . Hypertension   . Lower leg fracture   . Sleep apnea    CPAP  . Vitamin D deficiency     Past Surgical History:  Procedure Laterality Date  . CYST REMOVAL HAND Right    wrist   . LEG SURGERY Left 1971  . [place]   DDD    There were no vitals filed for this visit.  Subjective Assessment - 12/24/17 0907    Subjective  Reports that his greatest problem is with inclines.    Pertinent History  Left ankle fracture and surgery in 1971; back surgery in 1999; bilateral carpal tunnel surgery.    Patient Stated Goals  Walk up inclines with less pain.    Currently in Pain?  No/denies         [facility] PT Assessment - 12/24/17 0001      Assessment   Medical Diagnosis  Chronic pain of left ankle.    Referring Provider (PT)  Redge Gainer MD.    Next MD Visit  None      Precautions   Precautions  None      Restrictions   Weight Bearing Restrictions  No                   [facility] Adult PT Treatment/Exercise - 12/24/17 0001      Modalities   Modalities  Vasopneumatic      Vasopneumatic   Number Minutes Vasopneumatic   15 minutes    Vasopnuematic Location   Ankle    Vasopneumatic Pressure  Low    Vasopneumatic Temperature   64      Ankle Exercises: Aerobic   Stationary Bike  L3 x10 min      Ankle Exercises: Standing   Rocker Board  3 minutes    Heel Raises  Both;20 reps   off 4" step   Toe Raise  20 reps      Ankle Exercises: Seated   ABC's  1 rep    Other Seated Ankle Exercises  L ankle DF/Inv/Ev red theraband x20 reps ach      Ankle Exercises: Stretches   Soleus Stretch  1 rep;20 seconds   Attempted but painful across top of L midfoot              PT Short Term Goals - 12/09/17 1337      PT SHORT TERM GOAL #1   Title  STG's=LTG's.        PT Long Term Goals - 12/24/17 9758  PT LONG TERM GOAL #1   Title  Independent with a HEP.    Time  6    Period  Weeks    Status  On-going      PT LONG TERM GOAL #2   Title  Active left ankle dorsiflexion= 5 degrees to help normalize gait pattern.    Time  6    Period  Weeks    Status  On-going      PT LONG TERM GOAL #3   Title  Patient able to walk up incline and/or uneven terrain with pain not > 2-3/10.    Time  6    Period  Weeks    Status  On-going            Plan - 12/24/17 0940    Clinical Impression Statement  Patient tolerated today's treatment well as he arrived with no complaints of any L ankle pain. Patient still limited with L ankle DF at this time. A popping sensation in L midfoot reported by patient to which he correlated to lack of cartilage. Patient unable to tolerate standing soleus stretch secondary to pain in anterior L foot. No complaints with any strengthening or ROM exercise in sitting. Normal vasopneumatic response noted following removal of the modality.    Rehab Potential  Good    PT Frequency  2x / week    PT Duration  6 weeks    PT Treatment/Interventions  ADLs/Self Care Home Management;Electrical Stimulation;Cryotherapy;Ultrasound;Therapeutic exercise;Therapeutic activities;Functional mobility training;Patient/family education;Passive range of  motion;Manual techniques;Vasopneumatic Device    PT Next Visit Plan  cont with Rockerboard; standing left calf stretching; PROM / traction; ankle isolator; STW/modalites PRN.    Consulted and Agree with Plan of Care  Patient       Patient will benefit from skilled therapeutic intervention in order to improve the following deficits and impairments:  Abnormal gait, Decreased activity tolerance, Decreased range of motion, Pain  Visit Diagnosis: Pain in left ankle and joints of left foot  Stiffness of left ankle, not elsewhere classified     Problem List Patient Active Problem List   Diagnosis Date Noted  . Hypothyroidism (acquired) 12/01/2017  . Palpitation 09/30/2013  . Allergic rhinitis 05/27/2013  . Hypertension 05/27/2013  . Metabolic syndrome 44/81/8563  . BPH (benign prostatic hyperplasia) 11/25/2012  . Obstructive sleep apnea 11/25/2012  . Hyperlipemia 05/28/2012  . Vitamin D deficiency disease 05/28/2012    Standley Brooking, PTA 12/24/2017, 9:53 AM  [facility] [address] [place], [place], [address] Phone: [phone]   Fax:  [phone]  Name: Daniel Macias MRN: [id number] Date of Birth: [birth year]

## 2017-12-26 ENCOUNTER — Encounter: Payer: Self-pay | Admitting: Physical Therapy

## 2017-12-26 ENCOUNTER — Ambulatory Visit: Payer: Medicare HMO | Admitting: Physical Therapy

## 2017-12-26 DIAGNOSIS — M25572 Pain in left ankle and joints of left foot: Secondary | ICD-10-CM

## 2017-12-26 DIAGNOSIS — M25672 Stiffness of left ankle, not elsewhere classified: Secondary | ICD-10-CM

## 2017-12-26 NOTE — Patient Instructions (Signed)
  Dorsiflexion: Resisted   Facing anchor, tubing around left foot, pull toward face.  Repeat _10___ times per set. Do __2__ sets per session. Do _2___ sessions per day.  http://orth.exer.us/8   Copyright  VHI. All rights reserved.  Plantar Flexion: Resisted   Anchor behind, tubing around left foot, press down. Repeat __10__ times per set. Do __2__ sets per session. Do ___2_ sessions per day.  http://orth.exer.us/10   Copyright  VHI. All rights reserved.  Inversion: Resisted   Cross legs with right leg underneath, foot in tubing loop. Hold tubing around other foot to resist and turn foot in. Repeat _10___ times per set. Do __2__ sets per session. Do _2___ sessions per day.  http://orth.exer.us/12   Copyright  VHI. All rights reserved.  Eversion: Resisted   With left foot in tubing loop, hold tubing around other foot to resist and turn foot out. Repeat _10___ times per set. Do __2__ sets per session. Do __2__ sessions per day.  http://orth.exer.us/14   Copyright  VHI. All rights reserved.

## 2017-12-26 NOTE — Therapy (Signed)
[facility] [place], [place], [address] Phone: [phone]   Fax:  [phone]  Physical Therapy Treatment  Patient Details  Name: Daniel Macias MRN: [id number] Date of Birth: Jun 22, 1950 Referring Provider (PT): Redge Gainer MD.   Encounter Date: 12/26/2017  PT End of Session - 12/26/17 0957    Visit Number  6    Number of Visits  12    Date for PT Re-Evaluation  03/09/18    PT Start Time  0900    PT Stop Time  1001    PT Time Calculation (min)  61 min    Activity Tolerance  Patient tolerated treatment well    Behavior During Therapy  [facility] for tasks assessed/performed       Past Medical History:  Diagnosis Date  . Allergy   . Carpal tunnel syndrome, bilateral   . Hx of colonic polyps   . Hyperlipidemia   . Hypertension   . Lower leg fracture   . Sleep apnea    CPAP  . Vitamin D deficiency     Past Surgical History:  Procedure Laterality Date  . CYST REMOVAL HAND Right    wrist   . LEG SURGERY Left 1971  . [place]   DDD    There were no vitals filed for this visit.  Subjective Assessment - 12/26/17 0905    Subjective  Patient reported some improvement yet temporary, at the end of the day pain increases    Pertinent History  Left ankle fracture and surgery in 1971; back surgery in 1999; bilateral carpal tunnel surgery.    Patient Stated Goals  Walk up inclines with less pain.    Currently in Pain?  Yes    Pain Score  1     Pain Location  Ankle    Pain Orientation  Left;Anterior    Pain Descriptors / Indicators  Discomfort    Pain Type  Chronic pain    Pain Onset  More than a month ago    Pain Frequency  Constant    Aggravating Factors   walking bare foot or incline    Pain Relieving Factors  sitting at rest/non weight bearing         [facility] PT Assessment - 12/26/17 0001      ROM / Strength   AROM / PROM / Strength  AROM      AROM   AROM Assessment Site  Ankle    Right/Left  Ankle  Left    Left Ankle Dorsiflexion  1                   [facility] Adult PT Treatment/Exercise - 12/26/17 0001      Exercises   Exercises  Ankle      Moist Heat Therapy   Number Minutes Moist Heat  15 Minutes    Moist Heat Location  Ankle      Electrical Stimulation   Electrical Stimulation Location  left anterior ankle  premod x 15 mins 80-'150hz'     Electrical Stimulation Goals  Pain      Manual Therapy   Manual Therapy  Soft tissue mobilization;Passive ROM;Joint mobilization    Manual therapy comments  manual STW to ant ankle to reduce pain and tone    Joint Mobilization  Grade I posterior talocrural joint mobilizations and traction to decrease pain and improve movement    Passive ROM  gentle PROM/stretching for left ankle DF to improve range  and manual  traction      Ankle Exercises: Aerobic   Stationary Bike  L3 x10 min      Ankle Exercises: Machines for Strengthening   Cybex Leg Press  3 x10 heel lift half range 1 plt      Ankle Exercises: Seated   Other Seated Ankle Exercises  seated prostretch x 38mn    Other Seated Ankle Exercises  Ankle isolator DF 2# x20      Ankle Exercises: Supine   T-Band  2x10 4 way red band             PT Education - 12/26/17 0920    Education Details  HEP Red t-band    Person(s) Educated  Patient    Methods  Explanation;Demonstration;Handout    Comprehension  Verbalized understanding;Returned demonstration       PT Short Term Goals - 12/09/17 1337      PT SHORT TERM GOAL #1   Title  STG's=LTG's.        PT Long Term Goals - 12/26/17 1004      PT LONG TERM GOAL #1   Title  Independent with a HEP.    Baseline  MET 12/26/17    Time  6    Period  Weeks    Status  Achieved      PT LONG TERM GOAL #2   Title  Active left ankle dorsiflexion= 5 degrees to help normalize gait pattern.    Time  6    Period  Weeks    Status  On-going   AROM 1 degree DF 12/26/17     PT LONG TERM GOAL #3   Title  Patient able to  walk up incline and/or uneven terrain with pain not > 2-3/10.    Time  6    Period  Weeks    Status  On-going            Plan - 12/26/17 1010    Clinical Impression Statement  Patient tolerated treatment well today with only discomfort is DF past neutral. Patient reported 30% improvement overall. Patient has improved AROM DF to 1 degree today. Patient given HEP today with Red t-band. Patient has ongoing discomfort with walking barefoot or at incine. Educated patient on propor footwear as well as incerts for shoes to provide comfort and stability. Patient met LTG #1 today remaining ongoing.     Rehab Potential  Good    PT Frequency  2x / week    PT Duration  6 weeks    PT Treatment/Interventions  ADLs/Self Care Home Management;Electrical Stimulation;Cryotherapy;Ultrasound;Therapeutic exercise;Therapeutic activities;Functional mobility training;Patient/family education;Passive range of motion;Manual techniques;Vasopneumatic Device    PT Next Visit Plan  cont with Rockerboard; standing left calf stretching; PROM / traction; ankle isolator; STW/modalites PRN.    Consulted and Agree with Plan of Care  Patient       Patient will benefit from skilled therapeutic intervention in order to improve the following deficits and impairments:  Abnormal gait, Decreased activity tolerance, Decreased range of motion, Pain  Visit Diagnosis: Pain in left ankle and joints of left foot  Stiffness of left ankle, not elsewhere classified     Problem List Patient Active Problem List   Diagnosis Date Noted  . Hypothyroidism (acquired) 12/01/2017  . Palpitation 09/30/2013  . Allergic rhinitis 05/27/2013  . Hypertension 05/27/2013  . Metabolic syndrome 055/37/4827 . BPH (benign prostatic hyperplasia) 11/25/2012  . Obstructive sleep apnea 11/25/2012  . Hyperlipemia 05/28/2012  . Vitamin  D deficiency disease 05/28/2012    Phillips Climes, PTA 12/26/2017, 10:13 AM  [facility] [place], [place], [address] Phone: [phone]   Fax:  [phone]  Name: Daniel Macias MRN: [id number] Date of Birth: 06-07-1950

## 2017-12-31 ENCOUNTER — Other Ambulatory Visit: Payer: Medicare HMO

## 2017-12-31 ENCOUNTER — Ambulatory Visit: Payer: Medicare HMO | Admitting: *Deleted

## 2017-12-31 DIAGNOSIS — M25572 Pain in left ankle and joints of left foot: Secondary | ICD-10-CM | POA: Diagnosis not present

## 2017-12-31 DIAGNOSIS — M25672 Stiffness of left ankle, not elsewhere classified: Secondary | ICD-10-CM | POA: Diagnosis not present

## 2017-12-31 DIAGNOSIS — Z1211 Encounter for screening for malignant neoplasm of colon: Secondary | ICD-10-CM

## 2017-12-31 NOTE — Therapy (Signed)
[facility] [place], [place], [address] Phone: [phone]   Fax:  [phone]  Physical Therapy Treatment  Patient Details  Name: Daniel Macias MRN: [id number] Date of Birth: May 31, 1950 Referring Provider (PT): Redge Gainer MD.   Encounter Date: 12/31/2017  PT End of Session - 12/31/17 0942    Visit Number  7    Number of Visits  12    Date for PT Re-Evaluation  03/09/18    PT Start Time  0900    PT Stop Time  0951    PT Time Calculation (min)  51 min       Past Medical History:  Diagnosis Date  . Allergy   . Carpal tunnel syndrome, bilateral   . Hx of colonic polyps   . Hyperlipidemia   . Hypertension   . Lower leg fracture   . Sleep apnea    CPAP  . Vitamin D deficiency     Past Surgical History:  Procedure Laterality Date  . CYST REMOVAL HAND Right    wrist   . LEG SURGERY Left 1971  . [place]   DDD    There were no vitals filed for this visit.                    [facility] Adult PT Treatment/Exercise - 12/31/17 0001      Modalities   Modalities  --      Moist Heat Therapy   Number Minutes Moist Heat  15 Minutes    Moist Heat Location  Ankle      Electrical Stimulation   Electrical Stimulation Location  left anterior ankle  premod x 15 mins 80-'150hz'     Electrical Stimulation Goals  Pain      Manual Therapy   Manual Therapy  --    Passive ROM  Active DF to 5 degrees today      Ankle Exercises: Aerobic   Stationary Bike  L3 x10 min      Ankle Exercises: Machines for Strengthening   Cybex Leg Press  5 x10 heel lift half range 1 plt      Ankle Exercises: Standing   Rocker Board  3 minutes      Ankle Exercises: Supine   T-Band  --      Ankle Exercises: Seated   Other Seated Ankle Exercises  Ankle isolator DF 1# x20 and CW/CCW x 20                PT Short Term Goals - 12/09/17 1337      PT SHORT TERM GOAL #1   Title  STG's=LTG's.         PT Long Term Goals - 12/26/17 1004      PT LONG TERM GOAL #1   Title  Independent with a HEP.    Baseline  MET 12/26/17    Time  6    Period  Weeks    Status  Achieved      PT LONG TERM GOAL #2   Title  Active left ankle dorsiflexion= 5 degrees to help normalize gait pattern.    Time  6    Period  Weeks    Status  On-going   AROM 1 degree DF 12/26/17     PT LONG TERM GOAL #3   Title  Patient able to walk up incline and/or uneven terrain with pain not > 2-3/10.    Time  6  Period  Weeks    Status  On-going            Plan - 12/31/17 0944    Clinical Impression Statement  Pt arrived today reporting falling at home this weekend while trying to get his pants on. He reports landing on LT foot and stretching it into DF. He had increased soreness and ROM to 5 degrees DF today. He was able to perform all therex for ROM and strengthening with mainly C/O soreness.  Pt requested heat and estim for soreness and had normal response     Clinical Presentation  Evolving    Clinical Decision Making  Low    Rehab Potential  Good    PT Frequency  2x / week    PT Duration  6 weeks    PT Treatment/Interventions  ADLs/Self Care Home Management;Electrical Stimulation;Cryotherapy;Ultrasound;Therapeutic exercise;Therapeutic activities;Functional mobility training;Patient/family education;Passive range of motion;Manual techniques;Vasopneumatic Device    PT Next Visit Plan  cont with Rockerboard; standing left calf stretching; PROM / traction; ankle isolator; STW/modalites PRN.    Consulted and Agree with Plan of Care  Patient       Patient will benefit from skilled therapeutic intervention in order to improve the following deficits and impairments:  Abnormal gait, Decreased activity tolerance, Decreased range of motion, Pain  Visit Diagnosis: Pain in left ankle and joints of left foot  Stiffness of left ankle, not elsewhere classified     Problem List Patient Active Problem List    Diagnosis Date Noted  . Hypothyroidism (acquired) 12/01/2017  . Palpitation 09/30/2013  . Allergic rhinitis 05/27/2013  . Hypertension 05/27/2013  . Metabolic syndrome 44/73/9584  . BPH (benign prostatic hyperplasia) 11/25/2012  . Obstructive sleep apnea 11/25/2012  . Hyperlipemia 05/28/2012  . Vitamin D deficiency disease 05/28/2012    ,Daniel Macias, PTA 12/31/2017, 9:51 AM  [facility] [place], [place], [address] Phone: [phone]   Fax:  [phone]  Name: Daniel Macias MRN: [id number] Date of Birth: 07/12/1950

## 2017-12-31 NOTE — Therapy (Signed)
[facility] [place], [place], [address] Phone: [phone]   Fax:  [phone]  Physical Therapy Treatment  Patient Details  Name: Daniel Macias MRN: [id number] Date of Birth: 1950/11/18 Referring Provider (PT): Redge Gainer MD.   Encounter Date: 12/31/2017  PT End of Session - 12/31/17 0942    Visit Number  7    Number of Visits  12    Date for PT Re-Evaluation  03/09/18    PT Start Time  0900    PT Stop Time  0951    PT Time Calculation (min)  51 min       Past Medical History:  Diagnosis Date  . Allergy   . Carpal tunnel syndrome, bilateral   . Hx of colonic polyps   . Hyperlipidemia   . Hypertension   . Lower leg fracture   . Sleep apnea    CPAP  . Vitamin D deficiency     Past Surgical History:  Procedure Laterality Date  . CYST REMOVAL HAND Right    wrist   . LEG SURGERY Left 1971  . [place]   DDD    There were no vitals filed for this visit.  Subjective Assessment - 12/31/17 1132    Subjective  Fell at home this weekend and stretched my LT ankle into DF, but mainly just sore.    Pertinent History  Left ankle fracture and surgery in 1971; back surgery in 1999; bilateral carpal tunnel surgery.    Patient Stated Goals  Walk up inclines with less pain.    Currently in Pain?  Yes    Pain Score  2     Pain Location  Ankle    Pain Orientation  Left    Pain Descriptors / Indicators  Discomfort    Pain Type  Chronic pain    Pain Onset  More than a month ago                       [facility] Adult PT Treatment/Exercise - 12/31/17 0001      Modalities   Modalities  --      Moist Heat Therapy   Number Minutes Moist Heat  15 Minutes    Moist Heat Location  Ankle      Electrical Stimulation   Electrical Stimulation Location  left anterior ankle  premod x 15 mins 80-150hz    Electrical Stimulation Goals  Pain      Manual Therapy   Manual Therapy  --    Passive ROM   Active DF to 5 degrees today      Ankle Exercises: Aerobic   Stationary Bike  L3 x10 min      Ankle Exercises: Machines for Strengthening   Cybex Leg Press  5 x10 heel lift half range 1 plt      Ankle Exercises: Standing   Rocker Board  3 minutes      Ankle Exercises: Supine   T-Band  --      Ankle Exercises: Seated   Other Seated Ankle Exercises  Ankle isolator DF 1# x20 and CW/CCW x 20                PT Short Term Goals - 12/09/17 1337      PT SHORT TERM GOAL #1   Title  STG's=LTG's.        PT Long Term Goals - 12/26/17 1004      PT  LONG TERM GOAL #1   Title  Independent with a HEP.    Baseline  MET 12/26/17    Time  6    Period  Weeks    Status  Achieved      PT LONG TERM GOAL #2   Title  Active left ankle dorsiflexion= 5 degrees to help normalize gait pattern.    Time  6    Period  Weeks    Status  On-going   AROM 1 degree DF 12/26/17     PT LONG TERM GOAL #3   Title  Patient able to walk up incline and/or uneven terrain with pain not > 2-3/10.    Time  6    Period  Weeks    Status  On-going            Plan - 12/31/17 0944    Clinical Impression Statement  Pt arrived today reporting falling at home this weekend while trying to get his pants on. He reports landing on LT foot and stretching it into DF. He had increased soreness and ROM to 5 degrees DF today. He was able to perform all therex for ROM and strengthening with mainly C/O soreness.  Pt requested heat and estim for soreness and had normal response     Clinical Presentation  Evolving    Clinical Decision Making  Low    Rehab Potential  Good    PT Frequency  2x / week    PT Duration  6 weeks    PT Treatment/Interventions  ADLs/Self Care Home Management;Electrical Stimulation;Cryotherapy;Ultrasound;Therapeutic exercise;Therapeutic activities;Functional mobility training;Patient/family education;Passive range of motion;Manual techniques;Vasopneumatic Device    PT Next Visit Plan  cont  with Rockerboard; standing left calf stretching; PROM / traction; ankle isolator; STW/modalites PRN.    Consulted and Agree with Plan of Care  Patient       Patient will benefit from skilled therapeutic intervention in order to improve the following deficits and impairments:  Abnormal gait, Decreased activity tolerance, Decreased range of motion, Pain  Visit Diagnosis: Pain in left ankle and joints of left foot  Stiffness of left ankle, not elsewhere classified     Problem List Patient Active Problem List   Diagnosis Date Noted  . Hypothyroidism (acquired) 12/01/2017  . Palpitation 09/30/2013  . Allergic rhinitis 05/27/2013  . Hypertension 05/27/2013  . Metabolic syndrome 21/30/8657  . BPH (benign prostatic hyperplasia) 11/25/2012  . Obstructive sleep apnea 11/25/2012  . Hyperlipemia 05/28/2012  . Vitamin D deficiency disease 05/28/2012    RAMSEUR,CHRIS 12/31/2017, 11:36 AM  [facility] [place], [place], [address] Phone: [phone]   Fax:  [phone]  Name: Daniel Macias MRN: [id number] Date of Birth: 1950/07/10

## 2018-01-01 LAB — FECAL OCCULT BLOOD, IMMUNOCHEMICAL: Fecal Occult Bld: NEGATIVE

## 2018-01-02 ENCOUNTER — Encounter: Payer: PRIVATE HEALTH INSURANCE | Admitting: Physical Therapy

## 2018-01-03 ENCOUNTER — Encounter: Payer: PRIVATE HEALTH INSURANCE | Admitting: Physical Therapy

## 2018-01-07 ENCOUNTER — Encounter: Payer: Self-pay | Admitting: Physical Therapy

## 2018-01-07 ENCOUNTER — Ambulatory Visit: Payer: Medicare HMO | Admitting: Physical Therapy

## 2018-01-07 DIAGNOSIS — M25672 Stiffness of left ankle, not elsewhere classified: Secondary | ICD-10-CM

## 2018-01-07 DIAGNOSIS — M25572 Pain in left ankle and joints of left foot: Secondary | ICD-10-CM

## 2018-01-07 NOTE — Therapy (Signed)
[facility] [place], [place], [address] Phone: [phone]   Fax:  [phone]  Physical Therapy Treatment  Patient Details  Name: Daniel Macias MRN: [id number] Date of Birth: 1950-02-16 Referring Provider (PT): Redge Gainer MD.   Encounter Date: 01/07/2018  PT End of Session - 01/07/18 0905    Visit Number  8    Number of Visits  12    Date for PT Re-Evaluation  03/09/18    PT Start Time  0903    PT Stop Time  0948    PT Time Calculation (min)  45 min    Activity Tolerance  Patient tolerated treatment well    Behavior During Therapy  [facility] for tasks assessed/performed       Past Medical History:  Diagnosis Date  . Allergy   . Carpal tunnel syndrome, bilateral   . Hx of colonic polyps   . Hyperlipidemia   . Hypertension   . Lower leg fracture   . Sleep apnea    CPAP  . Vitamin D deficiency     Past Surgical History:  Procedure Laterality Date  . CYST REMOVAL HAND Right    wrist   . LEG SURGERY Left 1971  . [place]   DDD    There were no vitals filed for this visit.  Subjective Assessment - 01/07/18 0904    Subjective  Reports only soreness in L ankle now. 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    Pertinent History  Left ankle fracture and surgery in 1971; back surgery in 1999; bilateral carpal tunnel surgery.    Patient Stated Goals  Walk up inclines with less pain.    Currently in Pain?  Yes    Pain Score  6     Pain Location  Ankle    Pain Orientation  Left    Pain Descriptors / Indicators  Sore    Pain Type  Chronic pain    Pain Onset  More than a month ago         [facility] PT Assessment - 01/07/18 0001      Assessment   Medical Diagnosis  Chronic pain of left ankle.    Referring Provider (PT)  Redge Gainer MD.    Next MD Visit  None      Precautions   Precautions  None      Restrictions   Weight Bearing Restrictions  No                    [facility] Adult PT Treatment/Exercise - 01/07/18 0001      Modalities   Modalities  Vasopneumatic      Vasopneumatic   Number Minutes Vasopneumatic   15 minutes    Vasopnuematic Location   Ankle    Vasopneumatic Pressure  Low    Vasopneumatic Temperature   34      Ankle Exercises: Aerobic   Stationary Bike  L3 x10 min      Ankle Exercises: Standing   Rocker Board  5 minutes    Heel Raises  Both;20 reps    Toe Raise  20 reps      Ankle Exercises: Seated   ABC's  1 rep   Ankle isolator 2#    Other Seated Ankle Exercises  Ankle isolator 2# DF x30 reps      Ankle Exercises: Machines for Strengthening   Cybex Leg Press  3x10 reps 3 pl  heel lift               PT Short Term Goals - 12/09/17 1337      PT SHORT TERM GOAL #1   Title  STG's=LTG's.        PT Long Term Goals - 12/26/17 1004      PT LONG TERM GOAL #1   Title  Independent with a HEP.    Baseline  MET 12/26/17    Time  6    Period  Weeks    Status  Achieved      PT LONG TERM GOAL #2   Title  Active left ankle dorsiflexion= 5 degrees to help normalize gait pattern.    Time  6    Period  Weeks    Status  On-going   AROM 1 degree DF 12/26/17     PT LONG TERM GOAL #3   Title  Patient able to walk up incline and/or uneven terrain with pain not > 2-3/10.    Time  6    Period  Weeks    Status  On-going            Plan - 01/07/18 1004    Clinical Impression Statement  Patient presented in clinic with reports of only L ankle soreness as he did a lot of walking yesterday. Patient able to complete all exercises with only reports of soreness. Patient able to demonstrate improvement in L ankle DF in both standing and sitting. Normal vasopnuematic response noted following removal of the modality.    Rehab Potential  Good    PT Frequency  2x / week    PT Duration  6 weeks    PT Treatment/Interventions  ADLs/Self Care Home Management;Electrical  Stimulation;Cryotherapy;Ultrasound;Therapeutic exercise;Therapeutic activities;Functional mobility training;Patient/family education;Passive range of motion;Manual techniques;Vasopneumatic Device    PT Next Visit Plan  cont with Rockerboard; standing left calf stretching; PROM / traction; ankle isolator; STW/modalites PRN.    Consulted and Agree with Plan of Care  Patient       Patient will benefit from skilled therapeutic intervention in order to improve the following deficits and impairments:  Abnormal gait, Decreased activity tolerance, Decreased range of motion, Pain  Visit Diagnosis: Pain in left ankle and joints of left foot  Stiffness of left ankle, not elsewhere classified     Problem List Patient Active Problem List   Diagnosis Date Noted  . Hypothyroidism (acquired) 12/01/2017  . Palpitation 09/30/2013  . Allergic rhinitis 05/27/2013  . Hypertension 05/27/2013  . Metabolic syndrome 19/14/7829  . BPH (benign prostatic hyperplasia) 11/25/2012  . Obstructive sleep apnea 11/25/2012  . Hyperlipemia 05/28/2012  . Vitamin D deficiency disease 05/28/2012    Standley Brooking, PTA 01/07/2018, 10:10 AM  [facility] [address] [place], [place], [address] Phone: [phone]   Fax:  [phone]  Name: Daniel Macias MRN: [id number] Date of Birth: [birth year]

## 2018-01-21 ENCOUNTER — Encounter: Payer: Self-pay | Admitting: Physical Therapy

## 2018-01-21 ENCOUNTER — Ambulatory Visit: Payer: Medicare HMO | Attending: Family Medicine | Admitting: Physical Therapy

## 2018-01-21 DIAGNOSIS — M25572 Pain in left ankle and joints of left foot: Secondary | ICD-10-CM

## 2018-01-21 DIAGNOSIS — M25672 Stiffness of left ankle, not elsewhere classified: Secondary | ICD-10-CM | POA: Insufficient documentation

## 2018-01-21 NOTE — Therapy (Signed)
[facility] [place], [place], [address] Phone: [phone]   Fax:  [phone]  Physical Therapy Treatment  Patient Details  Name: Daniel Macias MRN: [id number] Date of Birth: 1950/09/03 Referring Provider (PT): Redge Gainer MD.   Encounter Date: 01/21/2018  PT End of Session - 01/21/18 0908    Visit Number  9    Number of Visits  12    Date for PT Re-Evaluation  03/09/18    PT Start Time  0902    PT Stop Time  0950    PT Time Calculation (min)  48 min    Activity Tolerance  Patient tolerated treatment well    Behavior During Therapy  [facility] for tasks assessed/performed       Past Medical History:  Diagnosis Date  . Allergy   . Carpal tunnel syndrome, bilateral   . Hx of colonic polyps   . Hyperlipidemia   . Hypertension   . Lower leg fracture   . Sleep apnea    CPAP  . Vitamin D deficiency     Past Surgical History:  Procedure Laterality Date  . CYST REMOVAL HAND Right    wrist   . LEG SURGERY Left 1971  . [place]   DDD    There were no vitals filed for this visit.  Subjective Assessment - 01/21/18 0906    Subjective  Reports he walked a lot while in [place] while on vacation. Reports pain wasn't that bad only had soreness.    Pertinent History  Left ankle fracture and surgery in 1971; back surgery in 1999; bilateral carpal tunnel surgery.    Patient Stated Goals  Walk up inclines with less pain.    Currently in Pain?  Yes    Pain Score  3     Pain Location  Ankle    Pain Orientation  Left    Pain Descriptors / Indicators  Sore    Pain Type  Chronic pain    Pain Onset  More than a month ago         [facility] PT Assessment - 01/21/18 0001      Assessment   Medical Diagnosis  Chronic pain of left ankle.    Referring Provider (PT)  Redge Gainer MD.    Next MD Visit  None      Precautions   Precautions  None      Restrictions   Weight Bearing Restrictions  No                    [facility] Adult PT Treatment/Exercise - 01/21/18 0001      Modalities   Modalities  Electrical Stimulation;Moist Heat      Moist Heat Therapy   Number Minutes Moist Heat  15 Minutes    Moist Heat Location  Ankle      Electrical Stimulation   Electrical Stimulation Location  L ankle/foot    Electrical Stimulation Action  Pre-Mod    Electrical Stimulation Parameters  80-150 hz x15 min    Electrical Stimulation Goals  Pain      Vasopneumatic   Number Minutes Vasopneumatic   --    Vasopnuematic Location   --    Vasopneumatic Pressure  --    Vasopneumatic Temperature   --      Ankle Exercises: Aerobic   Stationary Bike  L3 x10 min      Ankle Exercises: Standing   Rocker Board  3 minutes  Heel Raises  Both;20 reps   3D   Toe Raise  20 reps    Balance Beam  Sidestep x8 reps    Other Standing Ankle Exercises  L lunge x10 reps      Ankle Exercises: Seated   ABC's  1 rep   1.5# ankle isolator   Towel Crunch  Other (comment)   x20 reps     Ankle Exercises: Machines for Strengthening   Cybex Leg Press  3x10 reps 2.5 pl heel lift               PT Short Term Goals - 12/09/17 1337      PT SHORT TERM GOAL #1   Title  STG's=LTG's.        PT Long Term Goals - 01/21/18 0944      PT LONG TERM GOAL #1   Title  Independent with a HEP.    Baseline  MET 12/26/17    Time  6    Period  Weeks    Status  Achieved      PT LONG TERM GOAL #2   Title  Active left ankle dorsiflexion= 5 degrees to help normalize gait pattern.    Time  6    Period  Weeks    Status  On-going   AROM 1 degree DF 12/26/17     PT LONG TERM GOAL #3   Title  Patient able to walk up incline and/or uneven terrain with pain not > 2-3/10.    Time  6    Period  Weeks    Status  On-going   7/10 pain reported 01/21/2018           Plan - 01/21/18 0950    Clinical Impression Statement  Patient presented in clinic with only complaints of L ankle soreness after  increased walking during trip to [place]. Patient had no complaints with any of the increased discomfort with therex. No pain reported by patient during sidestep on balance beam but patient still reporting 7/10 pain with ambulating inclines or uneven ground. Normal modalities response noted following removal of the modalities.      Rehab Potential  Good    PT Frequency  2x / week    PT Duration  6 weeks    PT Treatment/Interventions  ADLs/Self Care Home Management;Electrical Stimulation;Cryotherapy;Ultrasound;Therapeutic exercise;Therapeutic activities;Functional mobility training;Patient/family education;Passive range of motion;Manual techniques;Vasopneumatic Device    PT Next Visit Plan  cont with Rockerboard; standing left calf stretching; PROM / traction; ankle isolator; STW/modalites PRN.    Consulted and Agree with Plan of Care  Patient       Patient will benefit from skilled therapeutic intervention in order to improve the following deficits and impairments:  Abnormal gait, Decreased activity tolerance, Decreased range of motion, Pain  Visit Diagnosis: Pain in left ankle and joints of left foot  Stiffness of left ankle, not elsewhere classified     Problem List Patient Active Problem List   Diagnosis Date Noted  . Hypothyroidism (acquired) 12/01/2017  . Palpitation 09/30/2013  . Allergic rhinitis 05/27/2013  . Hypertension 05/27/2013  . Metabolic syndrome 47/42/5956  . BPH (benign prostatic hyperplasia) 11/25/2012  . Obstructive sleep apnea 11/25/2012  . Hyperlipemia 05/28/2012  . Vitamin D deficiency disease 05/28/2012    Standley Brooking, PTA 01/21/2018, 10:14 AM  [facility] [address] [place], [place], [address] Phone: [phone]   Fax:  [phone]  Name: Daniel Macias MRN: [id number] Date of Birth:  08/04/1950

## 2018-01-23 ENCOUNTER — Encounter: Payer: Self-pay | Admitting: Physical Therapy

## 2018-01-23 ENCOUNTER — Ambulatory Visit: Payer: Medicare HMO | Admitting: Physical Therapy

## 2018-01-23 DIAGNOSIS — M25572 Pain in left ankle and joints of left foot: Secondary | ICD-10-CM | POA: Diagnosis not present

## 2018-01-23 DIAGNOSIS — M25672 Stiffness of left ankle, not elsewhere classified: Secondary | ICD-10-CM

## 2018-01-23 NOTE — Therapy (Signed)
[facility] [place], [place], [address] Phone: [phone]   Fax:  [phone]  Physical Therapy Treatment  Patient Details  Name: Daniel Macias MRN: [id number] Date of Birth: [birth year] Referring Provider (PT): Redge Gainer MD.   Encounter Date: 01/23/2018  PT End of Session - 01/23/18 0915    Visit Number  10    Number of Visits  12    Date for PT Re-Evaluation  03/09/18    PT Start Time  0904    PT Stop Time  0954    PT Time Calculation (min)  50 min    Activity Tolerance  Patient tolerated treatment well    Behavior During Therapy  [facility] for tasks assessed/performed       Past Medical History:  Diagnosis Date  . Allergy   . Carpal tunnel syndrome, bilateral   . Hx of colonic polyps   . Hyperlipidemia   . Hypertension   . Lower leg fracture   . Sleep apnea    CPAP  . Vitamin D deficiency     Past Surgical History:  Procedure Laterality Date  . CYST REMOVAL HAND Right    wrist   . LEG SURGERY Left 1971  . [place]   DDD    There were no vitals filed for this visit.  Subjective Assessment - 01/23/18 0909    Subjective  Reports increased soreness from the exercises.    Pertinent History  Left ankle fracture and surgery in 1971; back surgery in 1999; bilateral carpal tunnel surgery.    Patient Stated Goals  Walk up inclines with less pain.    Currently in Pain?  Yes    Pain Score  5     Pain Location  Foot    Pain Orientation  Left    Pain Descriptors / Indicators  Sore    Pain Type  Chronic pain    Pain Onset  More than a month ago         [facility] PT Assessment - 01/23/18 0001      Assessment   Medical Diagnosis  Chronic pain of left ankle.    Referring Provider (PT)  Redge Gainer MD.    Next MD Visit  None      Precautions   Precautions  None      Restrictions   Weight Bearing Restrictions  No                   [facility] Adult PT Treatment/Exercise - 01/23/18  0001      Modalities   Modalities  Electrical Stimulation;Moist Heat      Moist Heat Therapy   Number Minutes Moist Heat  15 Minutes    Moist Heat Location  Ankle      Electrical Stimulation   Electrical Stimulation Location  L ankle/foot    Electrical Stimulation Action  Pre-Mod    Electrical Stimulation Parameters  80-150 hz x15 min    Electrical Stimulation Goals  Pain      Ankle Exercises: Aerobic   Stationary Bike  L3 x10 min      Ankle Exercises: Standing   Rocker Board  3 minutes    Balance Beam  Tandem beam, sidestep beam x10 reps    Other Standing Ankle Exercises  L lunge x10 reps      Ankle Exercises: Machines for Strengthening   Cybex Leg Press  3x10 reps 2.5 pl heel lift  Ankle Exercises: Seated   ABC's  1 rep   1.5# ankle isolator              PT Short Term Goals - 12/09/17 1337      PT SHORT TERM GOAL #1   Title  STG's=LTG's.        PT Long Term Goals - 01/21/18 0944      PT LONG TERM GOAL #1   Title  Independent with a HEP.    Baseline  MET 12/26/17    Time  6    Period  Weeks    Status  Achieved      PT LONG TERM GOAL #2   Title  Active left ankle dorsiflexion= 5 degrees to help normalize gait pattern.    Time  6    Period  Weeks    Status  On-going   AROM 1 degree DF 12/26/17     PT LONG TERM GOAL #3   Title  Patient able to walk up incline and/or uneven terrain with pain not > 2-3/10.    Time  6    Period  Weeks    Status  On-going   7/10 pain reported 01/21/2018           Plan - 01/23/18 1025    Clinical Impression Statement  Patient presented in clinic with increased soreness from strengthening exercises completed in previous treatment. Only light exercises completed with soreness complaints continued. Patient reported some discomfort along mid forefoot during therex. Normal modalities response noted following removal of the modalities.    Rehab Potential  Good    PT Frequency  2x / week    PT Duration  6 weeks     PT Treatment/Interventions  ADLs/Self Care Home Management;Electrical Stimulation;Cryotherapy;Ultrasound;Therapeutic exercise;Therapeutic activities;Functional mobility training;Patient/family education;Passive range of motion;Manual techniques;Vasopneumatic Device    PT Next Visit Plan  cont with Rockerboard; standing left calf stretching; PROM / traction; ankle isolator; STW/modalites PRN.    Consulted and Agree with Plan of Care  Patient       Patient will benefit from skilled therapeutic intervention in order to improve the following deficits and impairments:  Abnormal gait, Decreased activity tolerance, Decreased range of motion, Pain  Visit Diagnosis: Pain in left ankle and joints of left foot  Stiffness of left ankle, not elsewhere classified     Problem List Patient Active Problem List   Diagnosis Date Noted  . Hypothyroidism (acquired) 12/01/2017  . Palpitation 09/30/2013  . Allergic rhinitis 05/27/2013  . Hypertension 05/27/2013  . Metabolic syndrome 16/11/9602  . BPH (benign prostatic hyperplasia) 11/25/2012  . Obstructive sleep apnea 11/25/2012  . Hyperlipemia 05/28/2012  . Vitamin D deficiency disease 05/28/2012    Standley Brooking, PTA 01/23/2018, 12:11 PM  [facility] [address] [place], [place], [address] Phone: [phone]   Fax:  [phone]  Name: Daniel Macias MRN: [id number] Date of Birth: November 25, 1950

## 2018-01-29 ENCOUNTER — Encounter: Payer: Self-pay | Admitting: Physical Therapy

## 2018-01-29 ENCOUNTER — Ambulatory Visit: Payer: Medicare HMO | Admitting: Physical Therapy

## 2018-01-29 DIAGNOSIS — M25672 Stiffness of left ankle, not elsewhere classified: Secondary | ICD-10-CM | POA: Diagnosis not present

## 2018-01-29 DIAGNOSIS — M25572 Pain in left ankle and joints of left foot: Secondary | ICD-10-CM | POA: Diagnosis not present

## 2018-01-29 NOTE — Therapy (Signed)
[facility] [place], [place], [address] Phone: [phone]   Fax:  [phone]  Physical Therapy Treatment  Patient Details  Name: Daniel Macias MRN: [id number] Date of Birth: 07-Mar-1950 Referring Provider (PT): Redge Gainer MD.   Encounter Date: 01/29/2018  PT End of Session - 01/29/18 0941    Visit Number  11    Number of Visits  12    Date for PT Re-Evaluation  03/09/18    PT Start Time  0901    PT Stop Time  0956    PT Time Calculation (min)  55 min    Activity Tolerance  Patient tolerated treatment well    Behavior During Therapy  [facility] for tasks assessed/performed       Past Medical History:  Diagnosis Date  . Allergy   . Carpal tunnel syndrome, bilateral   . Hx of colonic polyps   . Hyperlipidemia   . Hypertension   . Lower leg fracture   . Sleep apnea    CPAP  . Vitamin D deficiency     Past Surgical History:  Procedure Laterality Date  . CYST REMOVAL HAND Right    wrist   . LEG SURGERY Left 1971  . [place]   DDD    There were no vitals filed for this visit.  Subjective Assessment - 01/29/18 0902    Subjective  Patient reported some improvement after last treatment    Pertinent History  Left ankle fracture and surgery in 1971; back surgery in 1999; bilateral carpal tunnel surgery.    Patient Stated Goals  Walk up inclines with less pain.    Currently in Pain?  Yes    Pain Score  4     Pain Location  Foot    Pain Orientation  Left    Pain Descriptors / Indicators  Sore    Pain Type  Chronic pain    Pain Onset  More than a month ago    Pain Frequency  Constant    Aggravating Factors   walking bare foot or walking incline    Pain Relieving Factors  sitting and at rest         [facility] PT Assessment - 01/29/18 0001      AROM   AROM Assessment Site  Ankle    Right/Left Ankle  Left    Left Ankle Dorsiflexion  2                   [facility] Adult PT  Treatment/Exercise - 01/29/18 0001      Moist Heat Therapy   Number Minutes Moist Heat  15 Minutes    Moist Heat Location  Ankle      Electrical Stimulation   Electrical Stimulation Location  L ankle/foot    Electrical Stimulation Action  premod    Electrical Stimulation Parameters  80-'150hz'  x20mn    Electrical Stimulation Goals  Pain      Manual Therapy   Manual Therapy  Soft tissue mobilization;Passive ROM    Manual therapy comments  manual STW to ant ankle to reduce pain and tone    Joint Mobilization  Grade I posterior talocrural joint mobilizations and traction to decrease pain and improve movement    Passive ROM  PROM for DF to improve ROM      Ankle Exercises: Aerobic   Stationary Bike  L3 x10 min      Ankle Exercises: Standing   Rocker Board  3 minutes               PT Short Term Goals - 12/09/17 1337      PT SHORT TERM GOAL #1   Title  STG's=LTG's.        PT Long Term Goals - 01/29/18 0943      PT LONG TERM GOAL #1   Title  Independent with a HEP.    Baseline  MET 12/26/17    Time  6    Period  Weeks    Status  Achieved      PT LONG TERM GOAL #2   Title  Active left ankle dorsiflexion= 5 degrees to help normalize gait pattern.    Time  6    Period  Weeks    Status  On-going   AROM 2 degrees 01/29/18     PT LONG TERM GOAL #3   Title  Patient able to walk up incline and/or uneven terrain with pain not > 2-3/10.    Time  6    Period  Weeks    Status  On-going   4+/10 01/29/18           Plan - 01/29/18 0944    Clinical Impression Statement  Patient tolerated treatment well today. Patient has ongoing discomfort in ankle esp with walking at incline. Today focused on manual STW and ROM to improve range and function and help decrease pain. Patient feels overall improvement and improved AROM to 2 degrees DF in left ankle today. Goals progressing.     Rehab Potential  Good    PT Frequency  2x / week    PT Duration  6 weeks    PT  Treatment/Interventions  ADLs/Self Care Home Management;Electrical Stimulation;Cryotherapy;Ultrasound;Therapeutic exercise;Therapeutic activities;Functional mobility training;Patient/family education;Passive range of motion;Manual techniques;Vasopneumatic Device    PT Next Visit Plan  cont with Rockerboard; standing left calf stretching; PROM / traction; ankle isolator; STW/modalites PRN. 1 visit remaining    Consulted and Agree with Plan of Care  Patient       Patient will benefit from skilled therapeutic intervention in order to improve the following deficits and impairments:  Abnormal gait, Decreased activity tolerance, Decreased range of motion, Pain  Visit Diagnosis: Pain in left ankle and joints of left foot  Stiffness of left ankle, not elsewhere classified     Problem List Patient Active Problem List   Diagnosis Date Noted  . Hypothyroidism (acquired) 12/01/2017  . Palpitation 09/30/2013  . Allergic rhinitis 05/27/2013  . Hypertension 05/27/2013  . Metabolic syndrome 37/36/6815  . BPH (benign prostatic hyperplasia) 11/25/2012  . Obstructive sleep apnea 11/25/2012  . Hyperlipemia 05/28/2012  . Vitamin D deficiency disease 05/28/2012    Macias Climes, PTA 01/29/2018, 9:59 AM  [facility] [place], [place], [address] Phone: [phone]   Fax:  [phone]  Name: Daniel Macias MRN: [id number] Date of Birth: 05-28-1950

## 2018-01-30 ENCOUNTER — Encounter: Payer: Self-pay | Admitting: Physical Therapy

## 2018-01-30 ENCOUNTER — Ambulatory Visit: Payer: Medicare HMO | Admitting: Physical Therapy

## 2018-01-30 DIAGNOSIS — M25572 Pain in left ankle and joints of left foot: Secondary | ICD-10-CM

## 2018-01-30 DIAGNOSIS — M25672 Stiffness of left ankle, not elsewhere classified: Secondary | ICD-10-CM | POA: Diagnosis not present

## 2018-01-30 NOTE — Therapy (Addendum)
[facility] [place], [place], [address] Phone: [phone]   Fax:  [phone]  Physical Therapy Treatment  Patient Details  Name: Daniel Macias MRN: [id number] Date of Birth: [birth year] Referring Provider (PT): Redge Gainer MD.   Encounter Date: 01/30/2018  PT End of Session - 01/30/18 0955    Visit Number  12    Number of Visits  12    Date for PT Re-Evaluation  03/09/18    PT Start Time  0907    PT Stop Time  0957    PT Time Calculation (min)  50 min    Activity Tolerance  Patient tolerated treatment well    Behavior During Therapy  [facility] for tasks assessed/performed       Past Medical History:  Diagnosis Date  . Allergy   . Carpal tunnel syndrome, bilateral   . Hx of colonic polyps   . Hyperlipidemia   . Hypertension   . Lower leg fracture   . Sleep apnea    CPAP  . Vitamin D deficiency     Past Surgical History:  Procedure Laterality Date  . CYST REMOVAL HAND Right    wrist   . LEG SURGERY Left 1971  . [place]   DDD    There were no vitals filed for this visit.  Subjective Assessment - 01/30/18 0910    Subjective  Patient reported continued improvement after last treatment    Pertinent History  Left ankle fracture and surgery in 1971; back surgery in 1999; bilateral carpal tunnel surgery.    Patient Stated Goals  Walk up inclines with less pain.    Currently in Pain?  Yes    Pain Score  2     Pain Location  Foot    Pain Orientation  Left    Pain Descriptors / Indicators  Sore    Pain Type  Chronic pain    Pain Onset  More than a month ago    Pain Frequency  Constant    Aggravating Factors   walking bare foot or walking incline    Pain Relieving Factors  sitting / at rest                       [facility] Adult PT Treatment/Exercise - 01/30/18 0001      Moist Heat Therapy   Number Minutes Moist Heat  15 Minutes    Moist Heat Location  Ankle      Electrical  Stimulation   Electrical Stimulation Location  L ankle/foot    Electrical Stimulation Action  premod    Electrical Stimulation Parameters  80-150hz x51mn    Electrical Stimulation Goals  Pain      Manual Therapy   Manual Therapy  Soft tissue mobilization;Passive ROM    Manual therapy comments  manual STW to ant ankle to reduce pain and tone    Joint Mobilization  Grade I posterior talocrural joint mobilizations and traction to decrease pain and improve movement    Passive ROM  PROM for DF to improve ROM      Ankle Exercises: Aerobic   Stationary Bike  L3 x10 min      Ankle Exercises: Standing   Rocker Board  3 minutes               PT Short Term Goals - 12/09/17 1337      PT SHORT TERM GOAL #1   Title  STG's=LTG's.  PT Long Term Goals - 01/30/18 1009      PT LONG TERM GOAL #1   Title  Independent with a HEP.    Baseline  MET 12/26/17    Period  Weeks    Status  Achieved      PT LONG TERM GOAL #2   Title  Active left ankle dorsiflexion= 5 degrees to help normalize gait pattern.    Time  6    Period  Weeks    Status  Not Met   AROM 2 degrees 01/30/18     PT LONG TERM GOAL #3   Title  Patient able to walk up incline and/or uneven terrain with pain not > 2-3/10.    Time  6    Period  Weeks    Status  Not Met   4/10 01/30/18           Plan - 01/30/18 1000    Clinical Impression Statement  Patient tolerated treatment well today and feels overall progress. Patient has minimal discomfort with walking bare foot or at incline. Patient has 2 degrees DF today. Patient may be put on on per request to determine DC or continue with refferal.    Rehab Potential  Good    PT Frequency  2x / week    PT Duration  6 weeks    PT Treatment/Interventions  ADLs/Self Care Home Management;Electrical Stimulation;Cryotherapy;Ultrasound;Therapeutic exercise;Therapeutic activities;Functional mobility training;Patient/family education;Passive range of motion;Manual  techniques;Vasopneumatic Device    PT Next Visit Plan  on hold per patient request    Consulted and Agree with Plan of Care  Patient       Patient will benefit from skilled therapeutic intervention in order to improve the following deficits and impairments:  Abnormal gait, Decreased activity tolerance, Decreased range of motion, Pain  Visit Diagnosis: Pain in left ankle and joints of left foot  Stiffness of left ankle, not elsewhere classified     Problem List Patient Active Problem List   Diagnosis Date Noted  . Hypothyroidism (acquired) 12/01/2017  . Palpitation 09/30/2013  . Allergic rhinitis 05/27/2013  . Hypertension 05/27/2013  . Metabolic syndrome 50/38/8828  . BPH (benign prostatic hyperplasia) 11/25/2012  . Obstructive sleep apnea 11/25/2012  . Hyperlipemia 05/28/2012  . Vitamin D deficiency disease 05/28/2012    Phillips Climes, PTA 01/30/2018, 10:10 AM  [facility] [place], [place], [address] Phone: [phone]   Fax:  [phone]  Name: Daniel Macias MRN: [id number] Date of Birth: 07-May-1950   PHYSICAL THERAPY DISCHARGE SUMMARY  Visits from Start of Care: 12.  Current functional level related to goals / functional outcomes: See above.   Remaining deficits: See goal section.   Education / Equipment: HEP. Plan: Patient agrees to discharge.  Patient goals were partially met. Patient is being discharged due to lack of progress.  ?????          Mali Applegate MPT

## 2018-03-20 ENCOUNTER — Telehealth: Payer: Self-pay | Admitting: Family Medicine

## 2018-03-20 NOTE — Telephone Encounter (Signed)
Pt aware by VM

## 2018-03-20 NOTE — Telephone Encounter (Signed)
Repeat thyroid profile now as the last adjustments were done several weeks ago and it is not too early to do that now so we can make sure that the TSH is where it should be rather than waiting till April.

## 2018-03-26 ENCOUNTER — Other Ambulatory Visit: Payer: Medicare HMO

## 2018-03-26 DIAGNOSIS — N400 Enlarged prostate without lower urinary tract symptoms: Secondary | ICD-10-CM | POA: Diagnosis not present

## 2018-03-26 DIAGNOSIS — R000 Tachycardia, unspecified: Secondary | ICD-10-CM | POA: Diagnosis not present

## 2018-03-26 LAB — URINALYSIS, COMPLETE
BILIRUBIN UA: NEGATIVE
GLUCOSE, UA: NEGATIVE
KETONES UA: NEGATIVE
Leukocytes, UA: NEGATIVE
Nitrite, UA: NEGATIVE
PH UA: 5 (ref 5.0–7.5)
PROTEIN UA: NEGATIVE
SPEC GRAV UA: 1.02 (ref 1.005–1.030)
UUROB: 0.2 mg/dL (ref 0.2–1.0)

## 2018-03-26 LAB — MICROSCOPIC EXAMINATION
Bacteria, UA: NONE SEEN
EPITHELIAL CELLS (NON RENAL): NONE SEEN /HPF (ref 0–10)
RENAL EPITHEL UA: NONE SEEN /HPF
WBC, UA: NONE SEEN /hpf (ref 0–5)

## 2018-03-27 LAB — THYROID PANEL WITH TSH
FREE THYROXINE INDEX: 1.6 (ref 1.2–4.9)
T3 UPTAKE RATIO: 26 % (ref 24–39)
T4 TOTAL: 6.2 ug/dL (ref 4.5–12.0)
TSH: 3.27 u[IU]/mL (ref 0.450–4.500)

## 2018-04-15 ENCOUNTER — Telehealth: Payer: Self-pay | Admitting: Family Medicine

## 2018-04-15 ENCOUNTER — Other Ambulatory Visit: Payer: Self-pay | Admitting: *Deleted

## 2018-04-15 DIAGNOSIS — M25572 Pain in left ankle and joints of left foot: Secondary | ICD-10-CM

## 2018-04-15 DIAGNOSIS — M542 Cervicalgia: Secondary | ICD-10-CM

## 2018-04-15 NOTE — Telephone Encounter (Signed)
Patient states that Dr. Leonides Schanz is wanting patient to have xrays of cervical spine and left ankle and patient would like to have done at this office

## 2018-04-15 NOTE — Telephone Encounter (Signed)
If patient has not already had xrays of the cervical spine here, get those along with additional X-rays as requested and forward to doctor requesting them

## 2018-04-15 NOTE — Telephone Encounter (Signed)
Xrays ordered and patient aware

## 2018-04-16 ENCOUNTER — Ambulatory Visit (INDEPENDENT_AMBULATORY_CARE_PROVIDER_SITE_OTHER): Payer: Medicare HMO

## 2018-04-16 DIAGNOSIS — M25572 Pain in left ankle and joints of left foot: Secondary | ICD-10-CM | POA: Diagnosis not present

## 2018-04-16 DIAGNOSIS — M4802 Spinal stenosis, cervical region: Secondary | ICD-10-CM | POA: Diagnosis not present

## 2018-04-16 DIAGNOSIS — M542 Cervicalgia: Secondary | ICD-10-CM

## 2018-04-16 DIAGNOSIS — G4733 Obstructive sleep apnea (adult) (pediatric): Secondary | ICD-10-CM | POA: Diagnosis not present

## 2018-04-16 DIAGNOSIS — M47812 Spondylosis without myelopathy or radiculopathy, cervical region: Secondary | ICD-10-CM | POA: Diagnosis not present

## 2018-04-16 DIAGNOSIS — M19072 Primary osteoarthritis, left ankle and foot: Secondary | ICD-10-CM | POA: Diagnosis not present

## 2018-04-22 DIAGNOSIS — L821 Other seborrheic keratosis: Secondary | ICD-10-CM | POA: Diagnosis not present

## 2018-04-22 DIAGNOSIS — D0461 Carcinoma in situ of skin of right upper limb, including shoulder: Secondary | ICD-10-CM | POA: Diagnosis not present

## 2018-04-22 DIAGNOSIS — D485 Neoplasm of uncertain behavior of skin: Secondary | ICD-10-CM | POA: Diagnosis not present

## 2018-04-22 DIAGNOSIS — L570 Actinic keratosis: Secondary | ICD-10-CM | POA: Diagnosis not present

## 2018-04-22 DIAGNOSIS — Z85828 Personal history of other malignant neoplasm of skin: Secondary | ICD-10-CM | POA: Diagnosis not present

## 2018-04-30 ENCOUNTER — Other Ambulatory Visit: Payer: Self-pay | Admitting: Family Medicine

## 2018-05-18 ENCOUNTER — Other Ambulatory Visit: Payer: Self-pay | Admitting: Family Medicine

## 2018-05-19 NOTE — Telephone Encounter (Signed)
Last office visit and lipid panel 11/28/2017

## 2018-05-22 ENCOUNTER — Telehealth: Payer: Self-pay | Admitting: Family Medicine

## 2018-05-25 ENCOUNTER — Other Ambulatory Visit: Payer: Self-pay | Admitting: Family Medicine

## 2018-05-30 ENCOUNTER — Ambulatory Visit: Payer: Medicare HMO | Admitting: Family Medicine

## 2018-06-06 ENCOUNTER — Other Ambulatory Visit: Payer: Self-pay | Admitting: Family Medicine

## 2018-06-06 DIAGNOSIS — E039 Hypothyroidism, unspecified: Secondary | ICD-10-CM

## 2018-06-12 ENCOUNTER — Other Ambulatory Visit: Payer: Self-pay

## 2018-06-12 ENCOUNTER — Encounter: Payer: Medicare HMO | Admitting: *Deleted

## 2018-06-12 ENCOUNTER — Ambulatory Visit (INDEPENDENT_AMBULATORY_CARE_PROVIDER_SITE_OTHER): Payer: Medicare HMO | Admitting: *Deleted

## 2018-06-12 VITALS — Ht 67.0 in | Wt 211.0 lb

## 2018-06-12 DIAGNOSIS — Z0000 Encounter for general adult medical examination without abnormal findings: Secondary | ICD-10-CM

## 2018-06-12 NOTE — Progress Notes (Signed)
MEDICARE ANNUAL WELLNESS VISIT  06/12/2018  Telephone Visit Disclaimer This Medicare AWV was conducted by telephone due to national recommendations for restrictions regarding the COVID-19 Pandemic (e.g. social distancing).  I verified, using two identifiers, that I am speaking with Daniel Macias or their authorized healthcare agent. I discussed the limitations, risks, security, and privacy concerns of performing an evaluation and management service by telephone and the potential availability of an in-person appointment in the future. The patient expressed understanding and agreed to proceed.   Subjective:  Daniel Macias is a 68 y.o. male patient of Chipper Herb, MD who had a Medicare Annual Wellness Visit today via telephone. Daniel Macias is Retired and lives with their spouse. he has 2 children and 7 grandchildren. he reports that he is socially active and does interact with friends/family regularly. he is moderately physically active and enjoys riding horses and riding 4 wheelers.  Patient Care Team: Chipper Herb, MD as PCP - General (Family Medicine) Kristeen Miss, MD (Neurosurgery) Juanita Craver, MD (Gastroenterology) Minus Breeding, MD as Consulting Physician (Cardiology) Celestia Khat, [place] (Optometry) Wylene Simmer, MD as Consulting Physician (Orthopedic Surgery)  Advanced Directives 06/12/2018 06/11/2017 06/05/2016 06/02/2015  Does Patient Have a Medical Advance Directive? Yes Yes Yes No  Type of Paramedic of [place];Living will Living will [place];Living will -  Does patient want to make changes to medical advance directive? No - Patient declined No - Patient declined No - Patient declined -  Copy of [place] in Chart? No - copy requested - No - copy requested -  Would patient like information on creating a medical advance directive? - - - Yes - Educational materials given    Hospital Utilization Over the  Past 12 Months: # of hospitalizations or ER visits: 0 # of surgeries: 0  Review of Systems    Patient reports that his overall health is unchanged compared to last year. He does states that physically is body has declined.  Patient Reported Readings (BP, Pulse, CBG, Weight, etc) Wt- 211 lb this am , HT-67in VS from last OV: BP-100/65 Hr- 96 Temp- 97.8  Review of Systems: Negative except still having some left ankle pain and plans to follow up with DR Doran Durand on this.  All other systems negative.  Pain Assessment       Current Medications & Allergies (verified) Allergies as of 06/12/2018      Reactions   Morphine And Related Itching   Lisinopril Cough      Medication List       Accurate as of June 12, 2018  8:46 AM. Always use your most recent med list.        azelastine 0.1 % nasal spray Commonly known as:  ASTELIN Place 2 sprays into both nostrils at bedtime. Use in each nostril as directed   fluticasone 50 MCG/ACT nasal spray Commonly known as:  FLONASE PLACE 2 SPRAYS INTO BOTH NOSTRILS DAILY AS NEEDED.   levothyroxine 25 MCG tablet Commonly known as:  SYNTHROID TAKE 1 TABLET (25 MCG TOTAL) BY MOUTH DAILY BEFORE BREAKFAST.   losartan 100 MG tablet Commonly known as:  COZAAR TAKE 1 TABLET (100 MG TOTAL) BY MOUTH DAILY.   rosuvastatin 10 MG tablet Commonly known as:  CRESTOR TAKE 1 TABLET BY MOUTH EVERY DAY   Vitamin D (Ergocalciferol) 1.25 MG (50000 UT) Caps capsule Commonly known as:  DRISDOL TAKE 1 CAPSULE EVERY 7 DAYS  History (reviewed): Past Medical History:  Diagnosis Date  . Allergy   . Carpal tunnel syndrome, bilateral   . Hx of colonic polyps   . Hyperlipidemia   . Hypertension   . Lower leg fracture   . Sleep apnea    CPAP  . Vitamin D deficiency    Past Surgical History:  Procedure Laterality Date  . CYST REMOVAL HAND Right    wrist   . LEG SURGERY Left 1971  . SPINE SURGERY  1999   DDD   Family History  Problem  Relation Age of Onset  . Hypertension Mother   . Cancer Mother        uterine -METS to lungs  . Diabetes Mother   . Emphysema Father   . Heart disease Father   . Heart attack Brother 57       x 2   . Heart disease Brother   . Hypertension Brother   . Diabetes Brother   . Crohn's disease Son   . Heart attack Paternal Grandfather    Social History   Socioeconomic History  . Marital status: Married    Spouse name: linda  . Number of children: 2  . Years of education: Not on file  . Highest education level: Not on file  Occupational History  . Occupation: retired     Comment: Charity fundraiser  . Financial resource strain: Not on file  . Food insecurity:    Worry: Not on file    Inability: Not on file  . Transportation needs:    Medical: Not on file    Non-medical: Not on file  Tobacco Use  . Smoking status: Never Smoker  . Smokeless tobacco: Never Used  Substance and Sexual Activity  . Alcohol use: Yes    Comment: occasional  . Drug use: No  . Sexual activity: Yes  Lifestyle  . Physical activity:    Days per week: Not on file    Minutes per session: Not on file  . Stress: Not on file  Relationships  . Social connections:    Talks on phone: Not on file    Gets together: Not on file    Attends religious service: Not on file    Active member of club or organization: Not on file    Attends meetings of clubs or organizations: Not on file    Relationship status: Not on file  Other Topics Concern  . 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     Activities of Daily Living In your present state of health, do you have any difficulty performing the following activities: 06/12/2018  Hearing? N  Vision? Y  Comment reading glasses   Difficulty concentrating or making decisions? N  Walking or climbing stairs? Y  Comment left ankle pain from break in past - getting worse   Dressing or bathing? N  Doing errands, shopping? N   Preparing Food and eating ? N  Using the Toilet? N  In the past six months, have you accidently leaked urine? N  Do you have problems with loss of bowel control? N  Managing your Medications? N  Managing your Finances? N  Housekeeping or managing your Housekeeping? N  Some recent data might be hidden    Patient Literacy    Exercise Current Exercise Habits: The patient has a physically strenuous job, but has no regular exercise apart from work., Exercise limited by: None identified  Diet  Patient reports consuming 3 meals a day and 1 snack(s) a day Patient reports that his primary diet is: Regular Patient reports that she does have regular access to food.   Depression Screen PHQ 2/9 Scores 06/12/2018 11/28/2017 07/29/2017 06/11/2017 10/18/2016 06/05/2016 03/09/2016  PHQ - 2 Score 0 0 0 0 0 0 0     Fall Risk Fall Risk  06/12/2018 11/28/2017 07/29/2017 10/18/2016 06/05/2016  Falls in the past year? 1 No No No No  Number falls in past yr: 0 - - - -  Injury with Fall? 0 - - - -     Objective:  [place] seemed alert and oriented and he participated appropriately during our telephone visit.  Blood Pressure Weight BMI  BP Readings from Last 3 Encounters:  11/28/17 100/65  07/29/17 100/60  06/10/17 119/71   Wt Readings from Last 3 Encounters:  06/12/18 211 lb (95.7 kg)  11/28/17 212 lb (96.2 kg)  07/29/17 215 lb 9.6 oz (97.8 kg)   BMI Readings from Last 1 Encounters:  06/12/18 33.05 kg/m    *Unable to obtain current vital signs, weight, and BMI due to telephone visit type  Hearing/Vision  . [place] did not seem to have difficulty with hearing/understanding during the telephone conversation . Reports that he has had a formal eye exam by an eye care professional within the past year . Reports that he has not had a formal hearing evaluation within the past year *Unable to fully assess hearing and vision during telephone visit type  Cognitive Function: 6CIT Screen 06/12/2018   What Year? 0 points  What month? 0 points  What time? 0 points  Count back from 20 0 points  Months in reverse 0 points  Repeat phrase 0 points  Total Score 0    Normal Cognitive Function Screening: Yes (Normal:0-7, Significant for Dysfunction: >8)  Immunization & Health Maintenance Record Immunization History  Administered Date(s) Administered  . Influenza, High Dose Seasonal PF 11/28/2017  . Influenza,inj,Quad PF,6+ Mos 12/01/2014  . Influenza-Unspecified 11/12/2012, 12/15/2015  . Pneumococcal Conjugate-13 06/01/2014, 11/15/2014  . Pneumococcal Polysaccharide-23 06/02/2015, 11/14/2015  . Tdap 11/25/2012  . Zoster 05/28/2012    Health Maintenance  Topic Date Due  . Hepatitis C Screening  06/05/2021 (Originally 1950/02/17)  . INFLUENZA VACCINE  09/13/2018  . COLON CANCER SCREENING ANNUAL FOBT  01/01/2019  . COLONOSCOPY  11/12/2021  . TETANUS/TDAP  11/26/2022  . PNA vac Low Risk Adult  Completed       Assessment  This is a routine wellness examination for Daniel Macias.  Health Maintenance: Due or Overdue There are no preventive care reminders to display for this patient.  Daniel Macias does not need a referral for Community Assistance: Care Management:   no Social Work:    no Prescription Assistance:  no Nutrition/Diabetes Education:  no   Plan:  Personalized Goals Goals Addressed            This Visit's Progress   . Have 3 meals a day   On track     Personalized Health Maintenance & Screening Recommendations  none, pt is up to date.  Lung Cancer Screening Recommended: no (Low Dose CT Chest recommended if Age 67-80 years, 30 pack-year currently smoking OR have quit w/in past 15 years) Hepatitis C Screening recommended: not applicable HIV Screening recommended: no  Advanced Directives: Written information was not prepared per patient's request.  Referrals & Orders No orders of the defined types were placed in this  encounter.   Follow-up  Plan . Follow-up with Chipper Herb, MD as planned    I have personally reviewed and noted the following in the patient's chart:   . Medical and social history . Use of alcohol, tobacco or illicit drugs  . Current medications and supplements . Functional ability and status . Nutritional status . Physical activity . Advanced directives . List of other physicians . Hospitalizations, surgeries, and ER visits in previous 12 months . Vitals . Screenings to include cognitive, depression, and falls . Referrals and appointments  In addition, I have reviewed and discussed with Daniel Macias certain preventive protocols, quality metrics, and best practice recommendations. A written personalized care plan for preventive services as well as general preventive health recommendations is available and can be mailed to the patient at his request.      Zannie Cove, LPN  [id number]

## 2018-06-20 ENCOUNTER — Other Ambulatory Visit: Payer: Self-pay | Admitting: Family Medicine

## 2018-07-01 ENCOUNTER — Encounter: Payer: Self-pay | Admitting: Family Medicine

## 2018-07-01 ENCOUNTER — Other Ambulatory Visit: Payer: Self-pay

## 2018-07-01 ENCOUNTER — Telehealth: Payer: Self-pay

## 2018-07-01 ENCOUNTER — Ambulatory Visit (INDEPENDENT_AMBULATORY_CARE_PROVIDER_SITE_OTHER): Payer: Medicare HMO

## 2018-07-01 ENCOUNTER — Ambulatory Visit (INDEPENDENT_AMBULATORY_CARE_PROVIDER_SITE_OTHER): Payer: Medicare HMO | Admitting: Family Medicine

## 2018-07-01 VITALS — BP 108/71 | HR 88 | Temp 97.9°F | Ht 67.0 in | Wt 217.0 lb

## 2018-07-01 DIAGNOSIS — M2550 Pain in unspecified joint: Secondary | ICD-10-CM

## 2018-07-01 DIAGNOSIS — M25561 Pain in right knee: Secondary | ICD-10-CM

## 2018-07-01 DIAGNOSIS — W57XXXA Bitten or stung by nonvenomous insect and other nonvenomous arthropods, initial encounter: Secondary | ICD-10-CM | POA: Diagnosis not present

## 2018-07-01 DIAGNOSIS — M25461 Effusion, right knee: Secondary | ICD-10-CM | POA: Diagnosis not present

## 2018-07-01 DIAGNOSIS — R21 Rash and other nonspecific skin eruption: Secondary | ICD-10-CM | POA: Diagnosis not present

## 2018-07-01 MED ORDER — DOXYCYCLINE HYCLATE 100 MG PO TABS: 100.0000 mg | ORAL_TABLET | Freq: Two times a day (BID) | ORAL | 0 refills | Status: DC

## 2018-07-01 MED ORDER — TRAMADOL HCL 50 MG PO TABS: 50.0000 mg | ORAL_TABLET | Freq: Three times a day (TID) | ORAL | 0 refills | Status: AC | PRN

## 2018-07-01 MED ORDER — DICLOFENAC SODIUM 1 % TD GEL: 2.0000 g | Freq: Four times a day (QID) | TRANSDERMAL | 1 refills | Status: DC

## 2018-07-01 MED ORDER — CAMPHOR-MENTHOL-METHYL SAL 1.2-5.7-6.3 % EX PTCH: 1.0000 | MEDICATED_PATCH | Freq: Two times a day (BID) | CUTANEOUS | 0 refills | Status: AC

## 2018-07-01 NOTE — Telephone Encounter (Signed)
Sent in salonpas patch

## 2018-07-01 NOTE — Patient Instructions (Signed)

## 2018-07-01 NOTE — Addendum Note (Signed)
Addended by: Baruch Gouty on: 07/01/2018 04:25 PM   Modules accepted: Orders

## 2018-07-01 NOTE — Addendum Note (Signed)
Addended by: Baruch Gouty on: 07/01/2018 12:03 PM   Modules accepted: Orders

## 2018-07-01 NOTE — Telephone Encounter (Signed)
FYI, patient's Diclofenac gel was denied by [facility].

## 2018-07-01 NOTE — Progress Notes (Addendum)
Subjective:    Daniel Macias is a 68 y.o. male who presents with knee pain involving the right knee. Onset was gradual, starting about 2 weeks ago. Inciting event: injured during a fall while getting out of the back of a pickup truck. Current symptoms include: pain located posterior, lateral, stiffness and swelling. Pain is aggravated by any weight bearing, going up and down stairs, lateral movements, walking and full extension. Patient has had no prior knee problems. Evaluation to date: none. Treatment to date: OTC analgesics which are somewhat effective.  Pt also reports ongoing multiple joint pain. States this started after removing 10 embedded ticks. The ticks were on his abdomen and extremities. States he has not had a fever, confusion, chills, abdominal pain, nausea, vomiting, or diarrhea. States he did have a red raised rash around a few of the tick bites. He denies brain fog or fatigue. No other associated symptoms.   The following portions of the patient's history were reviewed and updated as appropriate: allergies, current medications, past family history, past medical history, past social history, past surgical history and problem list.   Review of Systems Constitutional: negative for chills, fatigue, fevers and malaise Eyes: negative Ears, nose, mouth, throat, and face: negative Respiratory: negative for cough, dyspnea on exertion and wheezing Cardiovascular: negative for chest pain, chest pressure/discomfort, lower extremity edema, orthopnea and palpitations Gastrointestinal: negative for abdominal pain, diarrhea, nausea and vomiting Integument/breast: positive for rash Musculoskeletal:positive for arthralgias and myalgias Neurological: negative for dizziness, weakness and confusion   Objective:    BP 108/71 (BP Location: Left Arm)   Pulse 88   Temp 97.9 F (36.6 C) (Oral)   Ht 5\' 7"  (1.702 m)   Wt 217 lb (98.4 kg)   BMI 33.99 kg/m   Physical Exam  Constitutional:  He is oriented to person, place, and time. He appears distressed (mild).  HENT:  Head: Normocephalic and atraumatic.  Mouth/Throat: Oropharynx is clear and moist.  Eyes: Pupils are equal, round, and reactive to light.  Neck: Neck supple.  Cardiovascular: Normal rate, regular rhythm, normal heart sounds and intact distal pulses. Exam reveals no gallop and no friction rub.  No murmur heard. Pulmonary/Chest: Effort normal and breath sounds normal. No respiratory distress.  Abdominal: Soft. Bowel sounds are normal. He exhibits no distension. There is no abdominal tenderness.  Musculoskeletal:        General: Tenderness and edema present.  Neurological: He is alert and oriented to person, place, and time.  Skin: Skin is warm and dry. No rash noted.  Psychiatric: Mood, memory, affect and judgment normal.   Right knee: positive exam findings: effusion, PCL laxity noted, tenderness noted posterior, lateral and ROM limited to approximately 75 degress extension due to pain degrees  Left knee:  normal and no effusion, full active range of motion, no joint line tenderness, ligamentous structures intact.   X-ray right knee: soft tissue swelling and medial joint space narrowing, no fracuture noted  Preliminary reading. Monia Pouch, FNP-C.   Assessment:   Daniel Macias was seen today for right knee pain.  Diagnoses and all orders for this visit:  Acute pain of right knee Sign and symptoms concerning for PCL injury. MRI ordered. Brace applied in office. Symptomatic care discussed. Will refer to ortho if MRI warrants. Medications as prescribed. RICE therapy discussed.  -     DG Knee 1-2 Views Right; Future -     MR Knee Right Wo Contrast; Future -     traMADol (  ULTRAM) 50 MG tablet; Take 1 tablet (50 mg total) by mouth every 8 (eight) hours as needed for up to 10 days. -     diclofenac sodium (VOLTAREN) 1 % GEL; Apply 2 g topically 4 (four) times daily for 30 days.  Tick bite, initial encounter Rash  Multiple joint pain Due to signs and symptoms will start empirical doxycycline until titers are available. Will discontinue therapy if warranted. Report any new or worsening symptoms.  -     Lyme Ab/Western Blot Reflex -     Rocky mtn spotted fvr abs pnl(IgG+IgM) -     doxycycline (VIBRA-TABS) 100 MG tablet; Take 1 tablet (100 mg total) by mouth 2 (two) times daily. 1 po bid     Plan:    Natural history and expected course discussed. Questions answered. Scientist, clinical (histocompatibility and immunogenetics) distributed. Rest, ice, compression, and elevation (RICE) therapy. MRI. Follow up in 4 weeks.    The above assessment and management plan was discussed with the patient. The patient verbalized understanding of and has agreed to the management plan. Patient is aware to call the clinic if symptoms fail to improve or worsen. Patient is aware when to return to the clinic for a follow-up visit. Patient educated on when it is appropriate to go to the emergency department.   Monia Pouch, FNP-C [facility] [address] [place], [place] [address] [phone]

## 2018-07-01 NOTE — Progress Notes (Signed)
MRI denied, will place referral to ortho.

## 2018-07-02 ENCOUNTER — Encounter: Payer: Self-pay | Admitting: Family Medicine

## 2018-07-02 ENCOUNTER — Ambulatory Visit (INDEPENDENT_AMBULATORY_CARE_PROVIDER_SITE_OTHER): Payer: Medicare HMO | Admitting: Family Medicine

## 2018-07-02 DIAGNOSIS — E782 Mixed hyperlipidemia: Secondary | ICD-10-CM | POA: Diagnosis not present

## 2018-07-02 DIAGNOSIS — I10 Essential (primary) hypertension: Secondary | ICD-10-CM

## 2018-07-02 DIAGNOSIS — M2550 Pain in unspecified joint: Secondary | ICD-10-CM

## 2018-07-02 DIAGNOSIS — G4733 Obstructive sleep apnea (adult) (pediatric): Secondary | ICD-10-CM

## 2018-07-02 DIAGNOSIS — R002 Palpitations: Secondary | ICD-10-CM

## 2018-07-02 DIAGNOSIS — Z0000 Encounter for general adult medical examination without abnormal findings: Secondary | ICD-10-CM

## 2018-07-02 DIAGNOSIS — R1084 Generalized abdominal pain: Secondary | ICD-10-CM | POA: Diagnosis not present

## 2018-07-02 DIAGNOSIS — E039 Hypothyroidism, unspecified: Secondary | ICD-10-CM | POA: Diagnosis not present

## 2018-07-02 DIAGNOSIS — E559 Vitamin D deficiency, unspecified: Secondary | ICD-10-CM | POA: Diagnosis not present

## 2018-07-02 DIAGNOSIS — M542 Cervicalgia: Secondary | ICD-10-CM | POA: Diagnosis not present

## 2018-07-02 NOTE — Addendum Note (Signed)
Addended by: Zannie Cove on: 07/02/2018 02:07 PM   Modules accepted: Orders

## 2018-07-02 NOTE — Progress Notes (Signed)
.      Virtual Visit Via telephone Note I connected with@ on 07/02/18 by telephone and verified that I am speaking with the correct person or authorized healthcare agent using two identifiers. Daniel Macias is currently located at home and there are no unauthorized people in close proximity. I completed this visit while in a private location in my home .  This visit type was conducted due to national recommendations for restrictions regarding the COVID-19 Pandemic (e.g. social distancing).  This format is felt to be most appropriate for this patient at this time.  All issues noted in this document were discussed and addressed.  No physical exam was performed.    I discussed the limitations, risks, security and privacy concerns of performing an evaluation and management service by telephone and the availability of in person appointments. I also discussed with the patient that there may be a patient responsible charge related to this service. The patient expressed understanding and agreed to proceed.   Date:  07/02/2018    ID:  [name]      Jun 22, 1950        [id number]   Patient Care Team Patient Care Team: Chipper Herb, MD as PCP - General (Family Medicine) Kristeen Miss, MD (Neurosurgery) Juanita Craver, MD (Gastroenterology) Minus Breeding, MD as Consulting Physician (Cardiology) Celestia Khat, [place] (Optometry) Wylene Simmer, MD as Consulting Physician (Orthopedic Surgery)  Reason for Visit: Primary Care Follow-up     History of Present Illness & Review of Systems:     Daniel Macias is a 68 y.o. year old male primary care patient that presents today for a telehealth visit.  Patient was in the office yesterday because of multiple tick bites and left knee pain.  According to the patient there was joint space narrowing and the pain was mostly on the back and medial side of the knee though the joint space narrowing was on the lateral part of the knee.  With a history of tick  bites, he was started on doxycycline and as result of the x-ray and MRI is being planned of the knee.  The patient denies any chest pain shortness of breath nausea vomiting diarrhea blood in the stool black tarry bowel movements or change in bowel habits.  He is due to get a colonoscopy repeat sometime in 2021 because of colon polyps previously found.  This will be by Dr. Collene Mares.  He is passing his water well.  No complaints.  The patient has a family history of heart disease diabetes and hypertension.  His dad had COPD.  The patient seems to have a lot of joint issues now with his knee and his neck and back.  Review of systems as stated, otherwise negative.  The patient does not have symptoms concerning for COVID-19 infection (fever, chills, cough, or new shortness of breath).      Current Medications (Verified) Allergies as of 07/02/2018      Reactions   Morphine And Related Itching   Lisinopril Cough      Medication List       Accurate as of Jul 02, 2018 10:16 AM. If you have any questions, ask your nurse or doctor.        azelastine 0.1 % nasal spray Commonly known as:  ASTELIN Place 2 sprays into both nostrils at bedtime. Use in each nostril as directed   Camphor-Menthol-Methyl Sal 1.2-5.7-6.3 % Ptch Apply 1 patch topically 2 (two) times a day for 14 days.  doxycycline 100 MG tablet Commonly known as:  VIBRA-TABS Take 1 tablet (100 mg total) by mouth 2 (two) times daily. 1 po bid   fluticasone 50 MCG/ACT nasal spray Commonly known as:  FLONASE PLACE 2 SPRAYS INTO BOTH NOSTRILS DAILY AS NEEDED.   levothyroxine 25 MCG tablet Commonly known as:  SYNTHROID TAKE 1 TABLET (25 MCG TOTAL) BY MOUTH DAILY BEFORE BREAKFAST.   losartan 100 MG tablet Commonly known as:  COZAAR TAKE 1 TABLET (100 MG TOTAL) BY MOUTH DAILY.   rosuvastatin 10 MG tablet Commonly known as:  CRESTOR TAKE 1 TABLET BY MOUTH EVERY DAY   traMADol 50 MG tablet Commonly known as:  ULTRAM Take 1 tablet (50  mg total) by mouth every 8 (eight) hours as needed for up to 10 days.   Vitamin D (Ergocalciferol) 1.25 MG (50000 UT) Caps capsule Commonly known as:  DRISDOL TAKE 1 CAPSULE EVERY 7 DAYS           Allergies (Verified)    Morphine and related and Lisinopril  Past Medical History Past Medical History:  Diagnosis Date  . Allergy   . Carpal tunnel syndrome, bilateral   . Hx of colonic polyps   . Hyperlipidemia   . Hypertension   . Lower leg fracture   . Sleep apnea    CPAP  . Vitamin D deficiency      Past Surgical History:  Procedure Laterality Date  . CYST REMOVAL HAND Right    wrist   . LEG SURGERY Left 1971  . [place]   DDD    Social History   Socioeconomic History  . Marital status: Married    Spouse name: linda  . Number of children: 2  . Years of education: Not on file  . Highest education level: Not on file  Occupational History  . Occupation: retired     Comment: Charity fundraiser  . Financial resource strain: Not on file  . Food insecurity:    Worry: Not on file    Inability: Not on file  . Transportation needs:    Medical: Not on file    Non-medical: Not on file  Tobacco Use  . Smoking status: Never Smoker  . Smokeless tobacco: Never Used  Substance and Sexual Activity  . Alcohol use: Yes    Comment: occasional  . Drug use: No  . Sexual activity: Yes  Lifestyle  . Physical activity:    Days per week: Not on file    Minutes per session: Not on file  . Stress: Not on file  Relationships  . Social connections:    Talks on phone: Not on file    Gets together: Not on file    Attends religious service: Not on file    Active member of club or organization: Not on file    Attends meetings of clubs or organizations: Not on file    Relationship status: Not on file  Other Topics Concern  . 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      Family History  Problem Relation Age of Onset  .  Hypertension Mother   . Cancer Mother        uterine -METS to lungs  . Diabetes Mother   . Emphysema Father   . Heart disease Father   . Heart attack Brother 57       x 2   . Heart disease Brother   .  Hypertension Brother   . Diabetes Brother   . Crohn's disease Son   . Heart attack Paternal Grandfather       Labs/Other Tests and Data Reviewed:    Wt Readings from Last 3 Encounters:  07/01/18 217 lb (98.4 kg)  06/12/18 211 lb (95.7 kg)  11/28/17 212 lb (96.2 kg)   Temp Readings from Last 3 Encounters:  07/01/18 97.9 F (36.6 C) (Oral)  11/28/17 97.8 F (36.6 C) (Oral)  07/29/17 97.6 F (36.4 C) (Oral)   BP Readings from Last 3 Encounters:  07/01/18 108/71  11/28/17 100/65  07/29/17 100/60   Pulse Readings from Last 3 Encounters:  07/01/18 88  11/28/17 96  07/29/17 80     Lab Results  Component Value Date   HGBA1C 6.0 05/27/2013   HGBA1C 6.4 % 12/02/2012   Lab Results  Component Value Date   LDLCALC 68 11/28/2017   CREATININE 1.03 11/28/2017       Chemistry      Component Value Date/Time   NA 141 11/28/2017 1610   K 4.4 11/28/2017 1610   CL 101 11/28/2017 1610   CO2 24 11/28/2017 1610   BUN 16 11/28/2017 1610   CREATININE 1.03 11/28/2017 1610   CREATININE 0.82 05/28/2012 1105      Component Value Date/Time   CALCIUM 9.5 11/28/2017 1610   ALKPHOS 82 11/28/2017 1610   AST 23 11/28/2017 1610   ALT 21 11/28/2017 1610   BILITOT 0.5 11/28/2017 1610         OBSERVATIONS/ OBJECTIVE:     The patient is pleasant and alert.  His weight is 211.  His blood pressure today is 104/76.  His pulse rate is 82.  He is very conscientious with taking care of his health.  With a positive family history we would encourage him to see the cardiologist every 3 to 4 years as a preventative measure.  He has to have his colonoscopy repeated because of previous colon polyps.  He is awaiting the results of the Lyme disease and [facility] spotted fever disease titers  while being started on doxycycline already.  Physical exam deferred due to nature of telephonic visit.  ASSESSMENT & PLAN    Time:   Today, I have spent 24 minutes with the patient via telephone discussing the above including Covid precautions.     Visit Diagnoses: 1. Neck pain -Arthritis profile will be added to blood work that is to be drawn.  2. Essential hypertension -Blood pressures have been good with current therapy continue with this.  And continue monitoring blood pressures regularly and watch sodium intake  3. Vitamin D deficiency disease -Continue with vitamin D replacement pending results of lab work  4. Hypothyroidism, unspecified type -Continue with current therapy pending results of lab work  5. Mixed hyperlipidemia -Continue with statin therapy and aggressive therapeutic lifestyle changes pending results of lab work  6. Multiple joint pain -This is an ongoing problem.  Patient is currently having a lot of trouble with his left knee and an MRI is being scheduled as result of a visit to the office yesterday.  7. Generalized abdominal pain -No complaints today with abdominal pain  8. Obstructive sleep apnea -Continue with CPAP  9. Hypothyroidism (acquired) -Continue with current therapy pending results of lab work  41.  Tick bites -Continue with doxycycline and await results of Lyme titer and [facility] spotted fever titer  11. Palpitation -No complaints with this currently. -Has strong family history of  heart disease.  We will make sure that he sees a cardiologist for follow-up every 3 to 4 years because of increased risk factors and family history.  Patient Instructions  Take doxycycline as recommended Get MRI of knee as planned Come to the office for routine lab work in a couple weeks to include uric acid and arthritis panel and urinalysis We will also arrange for you to see the cardiologist and make sure that you see him every 3 to 4 years for  prevention because of increased risk factors and family history of heart disease. Continue to practice good respiratory and hand hygiene Continue to drink plenty of fluids and stay well-hydrated     The above assessment and management plan was discussed with the patient. The patient verbalized understanding of and has agreed to the management plan. Patient is aware to call the clinic if symptoms persist or worsen. Patient is aware when to return to the clinic for a follow-up visit. Patient educated on when it is appropriate to go to the emergency department.    Chipper Herb, MD [address], [place], [place] [address] Ph [phone]   Arrie Senate MD

## 2018-07-02 NOTE — Patient Instructions (Signed)
Take doxycycline as recommended Get MRI of knee as planned Come to the office for routine lab work in a couple weeks to include uric acid and arthritis panel and urinalysis We will also arrange for you to see the cardiologist and make sure that you see him every 3 to 4 years for prevention because of increased risk factors and family history of heart disease. Continue to practice good respiratory and hand hygiene Continue to drink plenty of fluids and stay well-hydrated

## 2018-07-03 ENCOUNTER — Other Ambulatory Visit: Payer: Self-pay | Admitting: Family Medicine

## 2018-07-03 ENCOUNTER — Telehealth: Payer: Self-pay | Admitting: *Deleted

## 2018-07-03 LAB — ROCKY MTN SPOTTED FVR ABS PNL(IGG+IGM)
RMSF IgG: NEGATIVE
RMSF IgM: 0.33 index (ref 0.00–0.89)

## 2018-07-03 LAB — LYME AB/WESTERN BLOT REFLEX
LYME DISEASE AB, QUANT, IGM: <0.8 index (ref 0.00–0.79)
Lyme IgG/IgM Ab: <0.91 {ISR} (ref 0.00–0.90)

## 2018-07-03 NOTE — Telephone Encounter (Signed)
No, I feel it is either a ligament injury or arthritis.

## 2018-07-03 NOTE — Telephone Encounter (Signed)
Pt aware.

## 2018-07-09 ENCOUNTER — Other Ambulatory Visit: Payer: Self-pay

## 2018-07-09 ENCOUNTER — Ambulatory Visit (HOSPITAL_COMMUNITY)
Admission: RE | Admit: 2018-07-09 | Discharge: 2018-07-09 | Disposition: A | Discharge status: Home / Self Care | Payer: Medicare HMO | Source: Ambulatory Visit | Attending: Family Medicine | Admitting: Family Medicine

## 2018-07-09 DIAGNOSIS — M25561 Pain in right knee: Secondary | ICD-10-CM

## 2018-07-11 ENCOUNTER — Telehealth: Payer: Self-pay | Admitting: Family Medicine

## 2018-07-12 ENCOUNTER — Encounter: Payer: Self-pay | Admitting: Family Medicine

## 2018-07-14 ENCOUNTER — Other Ambulatory Visit: Payer: Self-pay | Admitting: *Deleted

## 2018-07-14 MED ORDER — VALSARTAN 160 MG PO TABS: 160.0000 mg | ORAL_TABLET | Freq: Every day | ORAL | 1 refills | Status: DC

## 2018-07-14 NOTE — Telephone Encounter (Signed)
FYI, patient was offered an appointment at [facility] on Wednesday, 07/16/2018 at 10:15 am but he declined appointment.  Wanted to only see Dr. 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

## 2018-07-23 DIAGNOSIS — G4733 Obstructive sleep apnea (adult) (pediatric): Secondary | ICD-10-CM | POA: Diagnosis not present

## 2018-07-29 ENCOUNTER — Encounter: Payer: Self-pay | Admitting: *Deleted

## 2018-08-07 DIAGNOSIS — M25561 Pain in right knee: Secondary | ICD-10-CM | POA: Diagnosis not present

## 2018-09-22 ENCOUNTER — Other Ambulatory Visit: Payer: Self-pay | Admitting: Family Medicine

## 2018-10-07 DIAGNOSIS — S83231A Complex tear of medial meniscus, current injury, right knee, initial encounter: Secondary | ICD-10-CM | POA: Diagnosis not present

## 2018-10-07 DIAGNOSIS — M2241 Chondromalacia patellae, right knee: Secondary | ICD-10-CM | POA: Diagnosis not present

## 2018-10-07 DIAGNOSIS — M94261 Chondromalacia, right knee: Secondary | ICD-10-CM | POA: Diagnosis not present

## 2018-10-07 DIAGNOSIS — Y999 Unspecified external cause status: Secondary | ICD-10-CM | POA: Diagnosis not present

## 2018-11-03 DIAGNOSIS — G4733 Obstructive sleep apnea (adult) (pediatric): Secondary | ICD-10-CM | POA: Diagnosis not present

## 2018-11-10 ENCOUNTER — Telehealth: Payer: Self-pay | Admitting: Family Medicine

## 2018-11-11 ENCOUNTER — Ambulatory Visit (INDEPENDENT_AMBULATORY_CARE_PROVIDER_SITE_OTHER): Payer: Medicare HMO | Admitting: Family

## 2018-11-11 ENCOUNTER — Ambulatory Visit (INDEPENDENT_AMBULATORY_CARE_PROVIDER_SITE_OTHER): Payer: Medicare HMO

## 2018-11-11 ENCOUNTER — Encounter: Payer: Self-pay | Admitting: Family

## 2018-11-11 ENCOUNTER — Other Ambulatory Visit: Payer: Self-pay

## 2018-11-11 VITALS — BP 118/83 | HR 85 | Temp 97.8°F | Ht 67.0 in | Wt 215.0 lb

## 2018-11-11 DIAGNOSIS — G8929 Other chronic pain: Secondary | ICD-10-CM

## 2018-11-11 DIAGNOSIS — M542 Cervicalgia: Secondary | ICD-10-CM

## 2018-11-11 MED ORDER — DICLOFENAC SODIUM 75 MG PO TBEC: 75.0000 mg | DELAYED_RELEASE_TABLET | Freq: Two times a day (BID) | ORAL | 0 refills | Status: DC

## 2018-11-11 MED ORDER — BACLOFEN 10 MG PO TABS: 10.0000 mg | ORAL_TABLET | Freq: Three times a day (TID) | ORAL | 0 refills | Status: DC

## 2018-11-11 NOTE — Progress Notes (Addendum)
Subjective:    Patient ID: Daniel Macias, male    DOB: Dec 31, 1950, 68 y.o.   MRN: [id number]  Chief Complaint  Patient presents with  . right side neck pain    Neck Pain  This is a recurrent problem. The current episode started more than 1 month ago. The problem occurs constantly. The problem has been gradually worsening. The pain is associated with a fall (fell off a truck in June 2020. States he tore is mescus in his right knee. Had surgery on this on Oct 07, 2018 and states it is doing better, but now his neck is becoming worse. ). The pain is present in the right side. The quality of the pain is described as aching. The pain is at a severity of 9/10. The pain is moderate. The symptoms are aggravated by twisting. Associated symptoms include tingling. Pertinent negatives include no fever, headaches, photophobia, visual change, weakness or weight loss. He has tried nothing for the symptoms. The treatment provided no relief.      Review of Systems  Constitutional: Negative for fever and weight loss.  Eyes: Negative for photophobia.  Musculoskeletal: Positive for neck pain.  Neurological: Positive for tingling. Negative for weakness and headaches.  All other systems reviewed and are negative.      Objective:   Physical Exam Vitals signs reviewed.  Constitutional:      General: He is not in acute distress.    Appearance: He is well-developed.  HENT:     Head: Normocephalic.  Eyes:     General:        Right eye: No discharge.        Left eye: No discharge.     Pupils: Pupils are equal, round, and reactive to light.  Neck:     Musculoskeletal: Normal range of motion and neck supple.     Thyroid: No thyromegaly.  Cardiovascular:     Rate and Rhythm: Normal rate and regular rhythm.     Heart sounds: Normal heart sounds. No murmur.  Pulmonary:     Effort: Pulmonary effort is normal. No respiratory distress.     Breath sounds: Normal breath sounds. No wheezing.  Abdominal:      General: Bowel sounds are normal. There is no distension.     Palpations: Abdomen is soft.     Tenderness: There is no abdominal tenderness.  Musculoskeletal:        General: Tenderness present.     Comments: Pain in posterior right neck with rotation and flexion. Unable to rotate  Head greater than 30 degrees  Skin:    General: Skin is warm and dry.     Findings: No erythema or rash.  Neurological:     Mental Status: He is alert and oriented to person, place, and time.     Cranial Nerves: No cranial nerve deficit.     Deep Tendon Reflexes: Reflexes are normal and symmetric.  Psychiatric:        Behavior: Behavior normal.        Thought Content: Thought content normal.        Judgment: Judgment normal.     BP 118/83   Pulse 85   Temp 97.8 F (36.6 C) (Temporal)   Ht 5\' 7"  (1.702 m)   Wt 215 lb (97.5 kg)   SpO2 96%   BMI 33.67 kg/m      Assessment & Plan:  DAMAREON WOODLIFF comes in today with chief complaint of right side neck  pain   Diagnosis and orders addressed:  1. Neck pain of over 3 months duration Rest ROM exercises encouraged MRI pending No other NSAID's while taking Diclofenac  RTO If symptoms worsen or do not improve  - DG Cervical Spine Complete; Future - MR Cervical Spine Wo Contrast; Future - Ambulatory referral to Physical Therapy - diclofenac (VOLTAREN) 75 MG EC tablet; Take 1 tablet (75 mg total) by mouth 2 (two) times daily.  Dispense: 30 tablet; Refill: 0 - baclofen (LIORESAL) 10 MG tablet; Take 1 tablet (10 mg total) by mouth 3 (three) times daily.  Dispense: 30 each; Refill: 0   Evelina Dun, FNP

## 2018-11-11 NOTE — Patient Instructions (Signed)
Cervical Radiculopathy  Cervical radiculopathy happens when a nerve in the neck (a cervical nerve) is pinched or bruised. This condition can happen because of an injury to the cervical spine (vertebrae) in the neck, or as part of the normal aging process. Pressure on the cervical nerves can cause pain or numbness that travels from the neck all the way down into the arm and fingers. Usually, this condition gets better with rest. Treatment may be needed if the condition does not improve. What are the causes? This condition may be caused by:  A neck injury.  A bulging (herniated) disk.  Muscle spasms.  Muscle tightness in the neck because of overuse.  Arthritis.  Breakdown or degeneration in the bones and joints of the spine (spondylosis) due to aging.  Bone spurs that may develop near the cervical nerves. What are the signs or symptoms? Symptoms of this condition include:  Pain. The pain may travel from the neck to the arm and hand. The pain can be severe or irritating. It may be worse when you move your neck.  Numbness or tingling in your arm or hand.  Weakness in the affected arm and hand, in severe cases. How is this diagnosed? This condition may be diagnosed based on your symptoms, your medical history, and a physical exam. You may also have tests, including:  X-rays.  A CT scan.  An MRI.  An electromyogram (EMG).  Nerve conduction tests. How is this treated? In many cases, treatment is not needed for this condition. With rest, the condition usually gets better over time. If treatment is needed, options may include:  Wearing a soft neck collar (cervical collar) for short periods of time, as told by your health care provider.  Doing physical therapy to strengthen your neck muscles.  Taking medicines, such as NSAIDs or oral corticosteroids.  Having spinal injections, in severe cases.  Having surgery. This may be needed if other treatments do not help. Different types  of surgery may be done depending on the cause of this condition. Follow these instructions at home: If you have a cervical collar:  Wear it as told by your health care provider. Remove it only as told by your health care provider.  Ask your health care provider if you can remove the collar for cleaning and bathing. If you are allowed to remove the collar for cleaning or bathing: ? Follow instructions from your health care provider about how to remove the collar safely. ? Clean the collar by wiping it with mild soap and water and drying it completely. ? Take out any removable pads in the collar every 1-2 days, and wash them by hand with soap and water. Let them air-dry completely before you put them back in the collar. ? Check your skin under the collar for irritation or sores. If you see any, tell your health care provider. Managing pain      Take over-the-counter and prescription medicines only as told by your health care provider.  If directed, put ice on the affected area. ? If you have a soft neck collar, remove it as told by your health care provider. ? Put ice in a plastic bag. ? Place a towel between your skin and the bag. ? Leave the ice on for 20 minutes, 2-3 times a day.  If applying ice does not help, you can try using heat. Use the heat source that your health care provider recommends, such as a moist heat pack or a heating pad. ?  Place a towel between your skin and the heat source. ? Leave the heat on for 20-30 minutes. ? Remove the heat if your skin turns bright red. This is especially important if you are unable to feel pain, heat, or cold. You may have a greater risk of getting burned.  Try a gentle neck and shoulder massage to help relieve symptoms. Activity  Rest as needed.  Return to your normal activities as told by your health care provider. Ask your health care provider what activities are safe for you.  Do stretching and strengthening exercises as told by  your health care provider or physical therapist.  Do not lift anything that is heavier than 10 lb (4.5 kg) until your health care provider tells you that it is safe. General instructions  Use a flat pillow when you sleep.  Do not drive while wearing a cervical collar. If you do not have a cervical collar, ask your health care provider if it is safe to drive while your neck heals.  Ask your health care provider if the medicine prescribed to you requires you to avoid driving or using heavy machinery.  Do not use any products that contain nicotine or tobacco, such as cigarettes, e-cigarettes, and chewing tobacco. These can delay healing. If you need help quitting, ask your health care provider.  Keep all follow-up visits as told by your health care provider. This is important. Contact a health care provider if:  Your condition does not improve with treatment. Get help right away if:  Your pain gets much worse and cannot be controlled with medicines.  You have weakness or numbness in your hand, arm, face, or leg.  You have a high fever.  You have a stiff, rigid neck.  You lose control of your bowels or your bladder (have incontinence).  You have trouble with walking, balance, or speaking. Summary  Cervical radiculopathy happens when a nerve in the neck is pinched or bruised.  A nerve can get pinched from a bulging disk, arthritis, muscle spasms, or an injury to the neck.  Symptoms include pain, tingling, or numbness radiating from the neck into the arm or hand. Weakness can also occur in severe cases.  Treatment may include rest, wearing a cervical collar, and physical therapy. Medicines may be prescribed to help with pain. In severe cases, injections or surgery may be needed. This information is not intended to replace advice given to you by your health care provider. Make sure you discuss any questions you have with your health care provider. Document Released: 10/24/2000  Document Revised: 12/20/2017 Document Reviewed: 12/20/2017 Elsevier Patient Education  2020 [employer]  

## 2018-11-11 NOTE — Telephone Encounter (Signed)
Pt here this am - closing encounter

## 2018-11-18 ENCOUNTER — Other Ambulatory Visit: Payer: Self-pay | Admitting: Family

## 2018-11-18 DIAGNOSIS — M542 Cervicalgia: Secondary | ICD-10-CM

## 2018-12-02 ENCOUNTER — Other Ambulatory Visit: Payer: Self-pay | Admitting: Family

## 2018-12-02 DIAGNOSIS — M542 Cervicalgia: Secondary | ICD-10-CM

## 2018-12-02 DIAGNOSIS — L812 Freckles: Secondary | ICD-10-CM | POA: Diagnosis not present

## 2018-12-02 DIAGNOSIS — Z85828 Personal history of other malignant neoplasm of skin: Secondary | ICD-10-CM | POA: Diagnosis not present

## 2018-12-02 DIAGNOSIS — L570 Actinic keratosis: Secondary | ICD-10-CM | POA: Diagnosis not present

## 2018-12-02 DIAGNOSIS — L821 Other seborrheic keratosis: Secondary | ICD-10-CM | POA: Diagnosis not present

## 2018-12-03 DIAGNOSIS — G4733 Obstructive sleep apnea (adult) (pediatric): Secondary | ICD-10-CM | POA: Diagnosis not present

## 2018-12-10 ENCOUNTER — Telehealth: Payer: Self-pay | Admitting: Family Medicine

## 2018-12-10 DIAGNOSIS — M542 Cervicalgia: Secondary | ICD-10-CM

## 2018-12-10 NOTE — Telephone Encounter (Signed)
Patient was seen by [facility].  He had an x-ray which showed multilevel degenerative changes within the C-spine.  I think she was placing the Ortho referral because he is having ongoing pain.  If he is not having any more neck pain he does not have to see them.

## 2018-12-10 NOTE — Telephone Encounter (Signed)
Patient would like to know why he is being referred to Pt know one ever call on his Xray from September,

## 2018-12-10 NOTE — Telephone Encounter (Signed)
Patient is asking about his referral.?

## 2018-12-10 NOTE — Telephone Encounter (Signed)
Called patient in regards to Ortho Ref - stated he was never given results on xray back in September and he is confused as to what he has to go to Ortho for.

## 2018-12-11 ENCOUNTER — Ambulatory Visit: Payer: Medicare HMO | Attending: Family | Admitting: Physical Therapy

## 2018-12-11 ENCOUNTER — Encounter: Payer: Self-pay | Admitting: Physical Therapy

## 2018-12-11 ENCOUNTER — Other Ambulatory Visit: Payer: Self-pay

## 2018-12-11 DIAGNOSIS — M25672 Stiffness of left ankle, not elsewhere classified: Secondary | ICD-10-CM | POA: Diagnosis not present

## 2018-12-11 DIAGNOSIS — M542 Cervicalgia: Secondary | ICD-10-CM | POA: Diagnosis not present

## 2018-12-11 DIAGNOSIS — M25572 Pain in left ankle and joints of left foot: Secondary | ICD-10-CM | POA: Insufficient documentation

## 2018-12-11 DIAGNOSIS — M5412 Radiculopathy, cervical region: Secondary | ICD-10-CM | POA: Diagnosis not present

## 2018-12-11 NOTE — Telephone Encounter (Signed)
Attempted to call patient.  No answer.

## 2018-12-11 NOTE — Telephone Encounter (Signed)
Your MRI was denied until you complete PT

## 2018-12-11 NOTE — Therapy (Addendum)
[facility] [place], [place], [address] Phone: [phone]   Fax:  [phone]  Physical Therapy Evaluation  Patient Details  Name: Daniel Macias MRN: [id number] Date of Birth: 1950/06/09 Referring Provider (PT): Evelina Dun, FNP   Encounter Date: 12/11/2018  PT End of Session - 12/11/18 1126    Visit Number  1    Number of Visits  12    Date for PT Re-Evaluation  01/29/19    Authorization Type  FOTO; Progress note every 10th visit; KX modifier at 15th visit    PT Start Time  0900    PT Stop Time  0950    PT Time Calculation (min)  50 min    Activity Tolerance  Patient tolerated treatment well    Behavior During Therapy  [facility] for tasks assessed/performed       Past Medical History:  Diagnosis Date  . Allergy   . Carpal tunnel syndrome, bilateral   . Hx of colonic polyps   . Hyperlipidemia   . Hypertension   . Lower leg fracture   . Sleep apnea    CPAP  . Vitamin D deficiency     Past Surgical History:  Procedure Laterality Date  . CYST REMOVAL HAND Right    wrist   . LEG SURGERY Left 1971  . [place]   DDD    There were no vitals filed for this visit.   Subjective Assessment - 12/11/18 1106    Subjective  COVID-19 screening performed upon arrival.Patient arrives to physical therapy with reports of bilateral cervical pain that began after falling off the back of his pickup truck. Patient reports ability to perform ADLs but with pain. Patient reports pain at worst with activities looking down and rotation. Patient also reports bilateral neurological symptoms particularly with looking down and left sidelying when going to sleep. Pain at worst as 10/10 and pain at best 3-4/10. Patient's goals are to decrease pain, improve movement and have less difficulties with home and yard activities.    Pertinent History  HTN, right knee meniscus repair 09/2018,    Limitations  Sitting;Reading;Lifting;House  hold activities    Diagnostic tests  X-ray: mulitlevel degenerative changes, see media    Patient Stated Goals  decrease pain, stop numbness and tingling    Currently in Pain?  Yes    Pain Score  5     Pain Location  Neck    Pain Orientation  Left    Pain Descriptors / Indicators  Tightness;Sore    Pain Type  Chronic pain    Pain Onset  More than a month ago    Pain Frequency  Constant    Aggravating Factors   looking down, reading, sleeping, rotation    Pain Relieving Factors  resting    Effect of Pain on Daily Activities  difficulties with home activities and reading.         [facility] PT Assessment - 12/11/18 0001      Assessment   Medical Diagnosis  Neck pain of over 3 months    Referring Provider (PT)  Evelina Dun, FNP    Onset Date/Surgical Date  --   June 2020   Hand Dominance  Right    Next MD Visit  12/25/2018    Prior Therapy  no      Precautions   Precautions  None      Restrictions   Weight Bearing Restrictions  No  Balance Screen   Has the patient fallen in the past 6 months  Yes    How many times?  1    Has the patient had a decrease in activity level because of a fear of falling?   No    Is the patient reluctant to leave their home because of a fear of falling?   No      Home Film/video editor residence      Prior Function   Level of Independence  Independent with basic ADLs      Observation/Other Assessments   Focus on Therapeutic Outcomes (FOTO)   To be completed next visit      ROM / Strength   AROM / PROM / Strength  AROM;Strength      AROM   AROM Assessment Site  Cervical    Cervical Flexion  44    Cervical Extension  24    Cervical - Right Side Bend  26    Cervical - Left Side Bend  20    Cervical - Right Rotation  40    Cervical - Left Rotation  43      Strength   Strength Assessment Site  Shoulder    Right/Left Shoulder  Right;Left    Right Shoulder Flexion  4+/5    Right Shoulder ABduction  4/5     Right Shoulder Internal Rotation  5/5    Right Shoulder External Rotation  5/5    Left Shoulder Flexion  4+/5    Left Shoulder ABduction  4/5    Left Shoulder Internal Rotation  5/5    Left Shoulder External Rotation  5/5      Palpation   Palpation comment  notable tightness to right lumbar parapsinals      Special Tests    Special Tests  Cervical    Cervical Tests  Dictraction      Distraction Test   Findngs  Positive    Comment  decrease in tension and neurological symptoms           Electrical stimulation: pre-mod to right cervical paraspinals and UT, 80-150 hz x10 mins for pain Moist heat pack to cervical spine for 10 minutes     Objective measurements completed on examination: See above findings.              PT Education - 12/11/18 1123    Education Details  chin tucks, chin tucks with overpressure, UT stretch, levator scap stretch,    Person(s) Educated  Patient    Methods  Explanation;Demonstration;Handout    Comprehension  Verbalized understanding          PT Long Term Goals - 12/11/18 1129      PT LONG TERM GOAL #1   Title  Patient will be independent with HEP    Time  6    Period  Weeks    Status  New      PT LONG TERM GOAL #2   Title  Patient will demonstrate 55+ degrees of cervical rotation to improve ability to scan environment look over shoulder during driving    Time  6    Period  Weeks    Status  New      PT LONG TERM GOAL #3   Title  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    Time  6    Period  Weeks  Status  New      PT LONG TERM GOAL #4   Title  Patient will report a decrease in neurological symptoms to bilateral right UEs to reduce nerve irritation    Time  6    Period  Weeks    Status  New      PT LONG TERM GOAL #5   Title  Patient will demonstrate 80 lbs of right grip force or equal to left to improve strength.    Time  6    Period  Weeks     Status  New             Plan - 12/11/18 1159    Clinical Impression Statement  Patient is a 68 year old male who presents to physical therapy with cervical pain that radiates to bilateral UE R>L. Patient noted with normal bilateral shoulder UE MMT but with decreased right grip strength in comparison to the right. Patient noted with diminished right triceps and biceps DTR. Patient and PT discussed HEP and plan of care to which patient reported understanding. Patient would benefit from skilled physical therapy to address deficits and address patient's goals.    Personal Factors and Comorbidities  Age;Comorbidity 1    Examination-Activity Limitations  Lift    Examination-Participation Restrictions  Meal Prep;Yard Work    Stability/Clinical Decision Making  Stable/Uncomplicated    Clinical Decision Making  Low    Rehab Potential  Good    PT Frequency  2x / week    PT Duration  6 weeks    PT Treatment/Interventions  ADLs/Self Care Home Management;Cryotherapy;Electrical Stimulation;Moist Heat;Traction;Ultrasound;Therapeutic activities;Therapeutic exercise;Patient/family education;Dry needling;Passive range of motion;Manual techniques;Spinal Manipulations    PT Next Visit Plan  FOTO already set up please; UBE, postural exercises, manual traction and progress to mechanical traction if good response to manual. e-stim and MHP to cervical spine.    PT Home Exercise Plan  see patient education section    Consulted and Agree with Plan of Care  Patient       Patient will benefit from skilled therapeutic intervention in order to improve the following deficits and impairments:  Decreased activity tolerance, Decreased range of motion, Postural dysfunction, Pain, Decreased strength  Visit Diagnosis: Pain in left ankle and joints of left foot - Plan: PT plan of care cert/re-cert  Stiffness of left ankle, not elsewhere classified - Plan: PT plan of care cert/re-cert  ADDENDUM: VISIT DIAGNOSIS:  CERVICALGIA AND RADICULOPATHY OF CERVICAL REGION- PLAN: PT plan of care/re-cert    Problem List Patient Active Problem List   Diagnosis Date Noted  . Hypothyroidism (acquired) 12/01/2017  . Palpitation 09/30/2013  . Allergic rhinitis 05/27/2013  . Hypertension 05/27/2013  . Metabolic syndrome [id number]  . BPH (benign prostatic hyperplasia) 11/25/2012  . Obstructive sleep apnea 11/25/2012  . Hyperlipemia 05/28/2012  . Vitamin D deficiency disease 05/28/2012    Gabriela Eves, PT, DPT 12/11/2018, 12:13 PM  [facility] [address] [place], [place], [address] Phone: [phone]   Fax:  [phone]  Name: VAHN HERNANDEZGARCI MRN: [id number] Date of Birth: 02/25/1950

## 2018-12-11 NOTE — Telephone Encounter (Signed)
Patient has a lot of questions about PT.  Patient states he told 3 people yesterday to have Christy or Dr. Darnell Level call him to explain things.  Please call patient.

## 2018-12-12 NOTE — Telephone Encounter (Signed)
Attempted to call patient again.    Called and discussed x-ray with patient. Will place Ortho referral.

## 2018-12-12 NOTE — Addendum Note (Signed)
Addended by: Evelina Dun A on: 12/12/2018 04:08 PM   Modules accepted: Orders

## 2018-12-16 ENCOUNTER — Ambulatory Visit: Payer: Medicare HMO | Attending: Family | Admitting: Physical Therapy

## 2018-12-16 ENCOUNTER — Encounter: Payer: Self-pay | Admitting: Physical Therapy

## 2018-12-16 ENCOUNTER — Other Ambulatory Visit: Payer: Self-pay

## 2018-12-16 DIAGNOSIS — M542 Cervicalgia: Secondary | ICD-10-CM

## 2018-12-16 DIAGNOSIS — M5412 Radiculopathy, cervical region: Secondary | ICD-10-CM | POA: Diagnosis not present

## 2018-12-16 NOTE — Therapy (Signed)
[facility] [place], [place], [address] Phone: [phone]   Fax:  [phone]  Physical Therapy Treatment  Patient Details  Name: Daniel Macias MRN: [id number] Date of Birth: 08-Jan-1951 Referring Provider (PT): Evelina Dun, FNP   Encounter Date: 12/16/2018  PT End of Session - 12/16/18 1054    Visit Number  2    Number of Visits  12    Date for PT Re-Evaluation  01/29/19    Authorization Type  FOTO; Progress note every 10th visit; KX modifier at 15th visit    PT Start Time  0945    PT Stop Time  1036    PT Time Calculation (min)  51 min    Activity Tolerance  Patient tolerated treatment well    Behavior During Therapy  [facility] for tasks assessed/performed       Past Medical History:  Diagnosis Date  . Allergy   . Carpal tunnel syndrome, bilateral   . Hx of colonic polyps   . Hyperlipidemia   . Hypertension   . Lower leg fracture   . Sleep apnea    CPAP  . Vitamin D deficiency     Past Surgical History:  Procedure Laterality Date  . CYST REMOVAL HAND Right    wrist   . LEG SURGERY Left 1971  . [place]   DDD    There were no vitals filed for this visit.  Subjective Assessment - 12/16/18 1053    Subjective  COVID-19 screening performed upon arrival. Patient reports 6/10 pain currently. States he is compliant with HEP but UT stretch causes discomfort therefore he stopped performing    Pertinent History  HTN, right knee meniscus repair 09/2018,    Limitations  Sitting;Reading;Lifting;House hold activities    Diagnostic tests  X-ray: mulitlevel degenerative changes, see media    Patient Stated Goals  decrease pain, stop numbness and tingling    Currently in Pain?  Yes    Pain Score  6     Pain Location  Neck    Pain Orientation  Right    Pain Descriptors / Indicators  Tightness;Sore    Pain Type  Chronic pain    Pain Radiating Towards  right hand    Pain Onset  More than a month ago    Pain Frequency  Constant         [facility] PT Assessment - 12/16/18 0001      Assessment   Medical Diagnosis  Neck pain of over 3 months    Referring Provider (PT)  Evelina Dun, FNP    Hand Dominance  Right    Next MD Visit  12/25/2018    Prior Therapy  no                   [facility] Adult PT Treatment/Exercise - 12/16/18 0001      Exercises   Exercises  Neck      Neck Exercises: Machines for Strengthening   UBE (Upper Arm Bike)  120 RPM 8 mins (4 fwd, 4 bwd)      Modalities   Modalities  Electrical Stimulation;Ultrasound;Moist Heat      Moist Heat Therapy   Number Minutes Moist Heat  10 Minutes    Moist Heat Location  Cervical      Electrical Stimulation   Electrical Stimulation Location  right cervical parapsinals and UT    Electrical Stimulation Action  pre-mod    Electrical Stimulation Parameters  80-150  hz x10 mins    Electrical Stimulation Goals  Pain      Ultrasound   Ultrasound Location  right UT and cervical paraspinals    Ultrasound Parameters  combo US/e-stim 100% 1 mhz  1.5 w/cm2     Ultrasound Goals  Pain      Manual Therapy   Manual Therapy  Manual Traction    Manual Traction  manual traction 15" hold x6 sub occipital release and manual levator scap and UT stetching                  PT Long Term Goals - 12/11/18 1129      PT LONG TERM GOAL #1   Title  Patient will be independent with HEP    Time  6    Period  Weeks    Status  New      PT LONG TERM GOAL #2   Title  Patient will demonstrate 55+ degrees of cervical rotation to improve ability to scan environment look over shoulder during driving    Time  6    Period  Weeks    Status  New      PT LONG TERM GOAL #3   Title  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    Time  6    Period  Weeks    Status  New      PT LONG TERM GOAL #4   Title  Patient will report a decrease in neurological symptoms to bilateral  right UEs to reduce nerve irritation    Time  6    Period  Weeks    Status  New      PT LONG TERM GOAL #5   Title  Patient will demonstrate 80 lbs of right grip force or equal to left to improve strength.    Time  6    Period  Weeks    Status  New            Plan - 12/16/18 1054    Clinical Impression Statement  Patient responded well to therapy session with no complaints of increased pain. Patient noted with increased right muscle tone in cervical paraspinals and UT that was relieved with combo US/e-stim. Patient responded well to manual traction with no reports of increase of symptoms. No adverse affects upon removal of modalities and reported a decrease in pain to 3-4/10.    Personal Factors and Comorbidities  Age;Comorbidity 1    Examination-Activity Limitations  Lift    Examination-Participation Restrictions  Meal Prep;Yard Work    Stability/Clinical Decision Making  Stable/Uncomplicated    Clinical Decision Making  Low    Rehab Potential  Good    PT Frequency  2x / week    PT Duration  6 weeks    PT Treatment/Interventions  ADLs/Self Care Home Management;Cryotherapy;Electrical Stimulation;Moist Heat;Traction;Ultrasound;Therapeutic activities;Therapeutic exercise;Patient/family education;Dry needling;Passive range of motion;Manual techniques;Spinal Manipulations    PT Next Visit Plan  FOTO already set up please; UBE, postural exercises, manual traction and progress to mechanical traction if good response to manual. e-stim and MHP to cervical spine.    PT Home Exercise Plan  see patient education section    Consulted and Agree with Plan of Care  Patient       Patient will benefit from skilled therapeutic intervention in order to improve the following deficits and impairments:  Decreased activity tolerance, Decreased range of motion, Postural  dysfunction, Pain, Decreased strength  Visit Diagnosis: Cervicalgia  Radiculopathy, cervical region     Problem List Patient  Active Problem List   Diagnosis Date Noted  . Hypothyroidism (acquired) 12/01/2017  . Palpitation 09/30/2013  . Allergic rhinitis 05/27/2013  . Hypertension 05/27/2013  . Metabolic syndrome [id number]  . BPH (benign prostatic hyperplasia) 11/25/2012  . Obstructive sleep apnea 11/25/2012  . Hyperlipemia 05/28/2012  . Vitamin D deficiency disease 05/28/2012    Gabriela Eves, PT, DPT 12/16/2018, 11:08 AM  [facility] [address] [place], [place], [address] Phone: [phone]   Fax:  [phone]  Name: Daniel Macias MRN: [id number] Date of Birth: 1950-03-09

## 2018-12-16 NOTE — Addendum Note (Signed)
Addended by: Gabriela Eves on: 12/16/2018 11:01 AM   Modules accepted: Orders

## 2018-12-18 ENCOUNTER — Ambulatory Visit: Payer: Medicare HMO | Admitting: Physical Therapy

## 2018-12-18 ENCOUNTER — Other Ambulatory Visit: Payer: Self-pay

## 2018-12-18 ENCOUNTER — Encounter: Payer: Self-pay | Admitting: Physical Therapy

## 2018-12-18 DIAGNOSIS — M542 Cervicalgia: Secondary | ICD-10-CM

## 2018-12-18 DIAGNOSIS — M5412 Radiculopathy, cervical region: Secondary | ICD-10-CM | POA: Diagnosis not present

## 2018-12-18 NOTE — Therapy (Signed)
[facility] [place], [place], [address] Phone: [phone]   Fax:  [phone]  Physical Therapy Treatment  Patient Details  Name: Daniel Macias MRN: [id number] Date of Birth: [birth year] Referring Provider (PT): Evelina Dun, FNP   Encounter Date: 12/18/2018  PT End of Session - 12/18/18 1100    Visit Number  3    Number of Visits  12    Date for PT Re-Evaluation  01/29/19    Authorization Type  FOTO; Progress note every 10th visit; KX modifier at 15th visit    PT Start Time  0900    PT Stop Time  0956    PT Time Calculation (min)  56 min    Activity Tolerance  Patient tolerated treatment well    Behavior During Therapy  [facility] for tasks assessed/performed       Past Medical History:  Diagnosis Date  . Allergy   . Carpal tunnel syndrome, bilateral   . Hx of colonic polyps   . Hyperlipidemia   . Hypertension   . Lower leg fracture   . Sleep apnea    CPAP  . Vitamin D deficiency     Past Surgical History:  Procedure Laterality Date  . CYST REMOVAL HAND Right    wrist   . LEG SURGERY Left 1971  . [place]   DDD    There were no vitals filed for this visit.  Subjective Assessment - 12/18/18 1059    Subjective  COVID-19 screening performed upon arrival. Patient reports 4/10 pain in cervical spine.    Pertinent History  HTN, right knee meniscus repair 09/2018,    Limitations  Sitting;Reading;Lifting;House hold activities    Diagnostic tests  X-ray: mulitlevel degenerative changes, see media    Patient Stated Goals  decrease pain, stop numbness and tingling    Currently in Pain?  Yes    Pain Score  4     Pain Location  Neck    Pain Orientation  Right    Pain Descriptors / Indicators  Tightness;Sore    Pain Type  Chronic pain    Pain Onset  More than a month ago    Pain Frequency  Constant         [facility] PT Assessment - 12/18/18 0001      Assessment   Medical Diagnosis  Neck pain of over  3 months    Referring Provider (PT)  Evelina Dun, FNP    Hand Dominance  Right    Next MD Visit  12/25/2018    Prior Therapy  no                   [facility] Adult PT Treatment/Exercise - 12/18/18 0001      Exercises   Exercises  Neck      Neck Exercises: Machines for Strengthening   UBE (Upper Arm Bike)  120 RPM 8 mins (4 fwd, 4 bwd)      Moist Heat Therapy   Number Minutes Moist Heat  10 Minutes    Moist Heat Location  Cervical      Electrical Stimulation   Electrical Stimulation Location  right cervical parapsinals and UT    Electrical Stimulation Action  pre-mod    Electrical Stimulation Parameters  80-150 hz x10 mins    Electrical Stimulation Goals  Pain      Ultrasound   Ultrasound Location  right UT and cervical paraspinals    Ultrasound Parameters  combo  US/e-stim 100%,    Ultrasound Goals  Pain      Manual Therapy   Manual Therapy  Manual Traction;Soft tissue mobilization    Soft tissue mobilization  STW/M to right cervical paraspinals to decrease tone and pain    Manual Traction  manual traction 15" hold x6 suboccipital release and manual levator scap and UT stetching                  PT Long Term Goals - 12/11/18 1129      PT LONG TERM GOAL #1   Title  Patient will be independent with HEP    Time  6    Period  Weeks    Status  New      PT LONG TERM GOAL #2   Title  Patient will demonstrate 55+ degrees of cervical rotation to improve ability to scan environment look over shoulder during driving    Time  6    Period  Weeks    Status  New      PT LONG TERM GOAL #3   Title  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    Time  6    Period  Weeks    Status  New      PT LONG TERM GOAL #4   Title  Patient will report a decrease in neurological symptoms to bilateral right UEs to reduce nerve irritation    Time  6    Period  Weeks    Status  New      PT LONG TERM GOAL #5    Title  Patient will demonstrate 80 lbs of right grip force or equal to left to improve strength.    Time  6    Period  Weeks    Status  New            Plan - 12/18/18 1056    Clinical Impression Statement  Patient responded well to therapy session with no reports of increased pain. Patient responded well to suboccipital relase as well as manual traction. Right UT and cervical paraspinal musculature has decreased in tone after combo US/E-stim. No adverse affects upon removal of modalities.    Personal Factors and Comorbidities  Age;Comorbidity 1    Examination-Activity Limitations  Lift    Examination-Participation Restrictions  Meal Prep;Yard Work    Stability/Clinical Decision Making  Stable/Uncomplicated    Clinical Decision Making  Low    Rehab Potential  Good    PT Frequency  2x / week    PT Duration  6 weeks    PT Treatment/Interventions  ADLs/Self Care Home Management;Cryotherapy;Electrical Stimulation;Moist Heat;Traction;Ultrasound;Therapeutic activities;Therapeutic exercise;Patient/family education;Dry needling;Passive range of motion;Manual techniques;Spinal Manipulations    PT Next Visit Plan  FOTO already set up please;mechanical traction starting at 15# UBE, postural exercises, manual traction and progress to mechanical traction if good response to manual. e-stim and MHP to cervical spine.    PT Home Exercise Plan  see patient education section    Consulted and Agree with Plan of Care  Patient       Patient will benefit from skilled therapeutic intervention in order to improve the following deficits and impairments:  Decreased activity tolerance, Decreased range of motion, Postural dysfunction, Pain, Decreased strength  Visit Diagnosis: Cervicalgia  Radiculopathy, cervical region     Problem List Patient Active Problem List   Diagnosis Date Noted  . Hypothyroidism (acquired) 12/01/2017  .  Palpitation 09/30/2013  . Allergic rhinitis 05/27/2013  .  Hypertension 05/27/2013  . Metabolic syndrome [id number]  . BPH (benign prostatic hyperplasia) 11/25/2012  . Obstructive sleep apnea 11/25/2012  . Hyperlipemia 05/28/2012  . Vitamin D deficiency disease 05/28/2012    Gabriela Eves, PT, DPT 12/18/2018, 12:54 PM  [facility] [address] [place], [place], [address] Phone: [phone]   Fax:  [phone]  Name: Daniel Macias MRN: [id number] Date of Birth: 17-Jan-1951

## 2018-12-23 ENCOUNTER — Ambulatory Visit: Payer: Medicare HMO | Admitting: Physical Therapy

## 2018-12-23 ENCOUNTER — Other Ambulatory Visit: Payer: Self-pay

## 2018-12-23 ENCOUNTER — Encounter: Payer: Self-pay | Admitting: Physical Therapy

## 2018-12-23 DIAGNOSIS — M542 Cervicalgia: Secondary | ICD-10-CM | POA: Diagnosis not present

## 2018-12-23 DIAGNOSIS — M5412 Radiculopathy, cervical region: Secondary | ICD-10-CM

## 2018-12-23 NOTE — Therapy (Signed)
[facility] [place], [place], [address] Phone: [phone]   Fax:  [phone]  Physical Therapy Treatment  Patient Details  Name: Daniel Macias MRN: [id number] Date of Birth: 1950-09-27 Referring Provider (PT): Evelina Dun, FNP   Encounter Date: 12/23/2018  PT End of Session - 12/23/18 0949    Visit Number  4    Number of Visits  12    Date for PT Re-Evaluation  01/29/19    Authorization Type  FOTO; Progress note every 10th visit; KX modifier at 15th visit    PT Start Time  0900    PT Stop Time  0948    PT Time Calculation (min)  48 min    Activity Tolerance  Patient tolerated treatment well       Past Medical History:  Diagnosis Date  . Allergy   . Carpal tunnel syndrome, bilateral   . Hx of colonic polyps   . Hyperlipidemia   . Hypertension   . Lower leg fracture   . Sleep apnea    CPAP  . Vitamin D deficiency     Past Surgical History:  Procedure Laterality Date  . CYST REMOVAL HAND Right    wrist   . LEG SURGERY Left 1971  . [place]   DDD    There were no vitals filed for this visit.  Subjective Assessment - 12/23/18 0948    Subjective  COVID-19 screening performed upon arrival. Patient reports 2/10 pain in cervical spine, still with discomfort with sleeping    Pertinent History  HTN, right knee meniscus repair 09/2018,    Limitations  Sitting;Reading;Lifting;House hold activities    Diagnostic tests  X-ray: mulitlevel degenerative changes, see media    Patient Stated Goals  decrease pain, stop numbness and tingling    Currently in Pain?  Yes    Pain Score  2     Pain Location  Neck    Pain Orientation  Right    Pain Descriptors / Indicators  Tightness    Pain Type  Chronic pain    Pain Onset  More than a month ago    Pain Frequency  Constant         [facility] PT Assessment - 12/23/18 0001      Assessment   Medical Diagnosis  Neck pain of over 3 months    Referring  Provider (PT)  Evelina Dun, FNP    Hand Dominance  Right    Next MD Visit  12/25/2018    Prior Therapy  no                   [facility] Adult PT Treatment/Exercise - 12/23/18 0001      Neck Exercises: Machines for Strengthening   UBE (Upper Arm Bike)  90RPM 8 mins (4 fwd, 4 bwd)      Neck Exercises: Standing   Neck Retraction  20 reps;5 secs;Other (comment)    Neck Retraction Limitations  yellow theraband for resistance      Modalities   Modalities  Electrical Stimulation;Moist Heat;Traction      Moist Heat Therapy   Number Minutes Moist Heat  10 Minutes    Moist Heat Location  Cervical      Electrical Stimulation   Electrical Stimulation Location  right cervical paraspinals and right UT    Electrical Stimulation Action  pre-mod    Electrical Stimulation Parameters  80-150 hz x10 mins    Electrical Stimulation Goals  Pain      Traction   Type of Traction  Cervical    Min (lbs)  5    Max (lbs)  15    Hold Time  99    Rest Time  5    Time  15                  PT Long Term Goals - 12/11/18 1129      PT LONG TERM GOAL #1   Title  Patient will be independent with HEP    Time  6    Period  Weeks    Status  New      PT LONG TERM GOAL #2   Title  Patient will demonstrate 55+ degrees of cervical rotation to improve ability to scan environment look over shoulder during driving    Time  6    Period  Weeks    Status  New      PT LONG TERM GOAL #3   Title  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    Time  6    Period  Weeks    Status  New      PT LONG TERM GOAL #4   Title  Patient will report a decrease in neurological symptoms to bilateral right UEs to reduce nerve irritation    Time  6    Period  Weeks    Status  New      PT LONG TERM GOAL #5   Title  Patient will demonstrate 80 lbs of right grip force or equal to left to improve strength.    Time  6    Period  Weeks     Status  New            Plan - 12/23/18 0949    Clinical Impression Statement  Patient was able to tolerate treatment well with no reports of increased pain. Patient was able to resisted chin tucks with yellow theraband. Mechanical traction initiated at 15# with no adverse affects. Patient educated on use of e-stim and purpose in plan of care. Patient also educated to assess pain and symptoms throughout the day and report next visit. Traction to be maintained at 15# next visit then increase to tolerance.    Personal Factors and Comorbidities  Age;Comorbidity 1    Examination-Activity Limitations  Lift    Examination-Participation Restrictions  Meal Prep;Yard Work    Stability/Clinical Decision Making  Stable/Uncomplicated    Clinical Decision Making  Low    Rehab Potential  Good    PT Frequency  2x / week    PT Duration  6 weeks    PT Treatment/Interventions  ADLs/Self Care Home Management;Cryotherapy;Electrical Stimulation;Moist Heat;Traction;Ultrasound;Therapeutic activities;Therapeutic exercise;Patient/family education;Dry needling;Passive range of motion;Manual techniques;Spinal Manipulations    PT Next Visit Plan  mechanical traction starting at 15# UBE, postural exercises, manual traction and progress to mechanical traction if good response to manual. e-stim and MHP to cervical spine.    PT Home Exercise Plan  see patient education section    Consulted and Agree with Plan of Care  Patient       Patient will benefit from skilled therapeutic intervention in order to improve the following deficits and impairments:  Decreased activity tolerance, Decreased range of motion, Postural dysfunction, Pain, Decreased strength  Visit Diagnosis: Cervicalgia  Radiculopathy, cervical region     Problem List Patient Active Problem List  Diagnosis Date Noted  . Hypothyroidism (acquired) 12/01/2017  . Palpitation 09/30/2013  . Allergic rhinitis 05/27/2013  . Hypertension 05/27/2013  .  Metabolic syndrome [id number]  . BPH (benign prostatic hyperplasia) 11/25/2012  . Obstructive sleep apnea 11/25/2012  . Hyperlipemia 05/28/2012  . Vitamin D deficiency disease 05/28/2012    Gabriela Eves, PT, DPT 12/23/2018, 10:01 AM  [facility] [place], [place], [address] Phone: [phone]   Fax:  [phone]  Name: Daniel Macias MRN: [id number] Date of Birth: 08-20-1950

## 2018-12-25 ENCOUNTER — Encounter: Payer: Self-pay | Admitting: Family Medicine

## 2018-12-25 ENCOUNTER — Ambulatory Visit (INDEPENDENT_AMBULATORY_CARE_PROVIDER_SITE_OTHER): Payer: Medicare HMO | Admitting: Family Medicine

## 2018-12-25 ENCOUNTER — Other Ambulatory Visit: Payer: Self-pay

## 2018-12-25 ENCOUNTER — Encounter: Payer: Self-pay | Admitting: Physical Therapy

## 2018-12-25 ENCOUNTER — Ambulatory Visit: Payer: Medicare HMO | Admitting: Physical Therapy

## 2018-12-25 VITALS — BP 112/62 | HR 68 | Temp 98.4°F | Ht 67.0 in | Wt 219.0 lb

## 2018-12-25 DIAGNOSIS — Z9889 Other specified postprocedural states: Secondary | ICD-10-CM

## 2018-12-25 DIAGNOSIS — S46211A Strain of muscle, fascia and tendon of other parts of biceps, right arm, initial encounter: Secondary | ICD-10-CM | POA: Insufficient documentation

## 2018-12-25 DIAGNOSIS — E039 Hypothyroidism, unspecified: Secondary | ICD-10-CM

## 2018-12-25 DIAGNOSIS — N400 Enlarged prostate without lower urinary tract symptoms: Secondary | ICD-10-CM | POA: Diagnosis not present

## 2018-12-25 DIAGNOSIS — M4722 Other spondylosis with radiculopathy, cervical region: Secondary | ICD-10-CM | POA: Insufficient documentation

## 2018-12-25 DIAGNOSIS — M5412 Radiculopathy, cervical region: Secondary | ICD-10-CM

## 2018-12-25 DIAGNOSIS — M545 Low back pain, unspecified: Secondary | ICD-10-CM

## 2018-12-25 DIAGNOSIS — Z23 Encounter for immunization: Secondary | ICD-10-CM | POA: Diagnosis not present

## 2018-12-25 DIAGNOSIS — M542 Cervicalgia: Secondary | ICD-10-CM | POA: Diagnosis not present

## 2018-12-25 DIAGNOSIS — E782 Mixed hyperlipidemia: Secondary | ICD-10-CM

## 2018-12-25 DIAGNOSIS — E559 Vitamin D deficiency, unspecified: Secondary | ICD-10-CM

## 2018-12-25 DIAGNOSIS — M25511 Pain in right shoulder: Secondary | ICD-10-CM | POA: Diagnosis not present

## 2018-12-25 DIAGNOSIS — S46111A Strain of muscle, fascia and tendon of long head of biceps, right arm, initial encounter: Secondary | ICD-10-CM | POA: Diagnosis not present

## 2018-12-25 HISTORY — DX: Strain of muscle, fascia and tendon of other parts of biceps, right arm, initial encounter: S46.211A

## 2018-12-25 MED ORDER — BACLOFEN 10 MG PO TABS: 10.0000 mg | ORAL_TABLET | Freq: Three times a day (TID) | ORAL | 1 refills | Status: DC | PRN

## 2018-12-25 MED ORDER — DICLOFENAC SODIUM 75 MG PO TBEC: 75.0000 mg | DELAYED_RELEASE_TABLET | Freq: Two times a day (BID) | ORAL | 0 refills | Status: DC

## 2018-12-25 NOTE — Progress Notes (Signed)
Subjective: CC: Neck pain, low back pain, right bicep PCP: Janora Norlander, DO Daniel Macias is a 68 y.o. male presenting to clinic today for:  1.  Neck pain, low back pain, right bicep Patient reports onset of neck pain about 2 months ago.  He notes that he has ongoing numbness and tingling that goes down the right upper extremity, particularly during the nighttime.  He was evaluated in September and had x-rays done which show degenerative changes within the neck.  He has been referred to physical therapy does note that pain is somewhat better with physical therapy.  He is also been taking the baclofen and diclofenac intermittently which do seem to help.  There are plans for MRI of the neck but insurance required physical therapy first.  He also has a history of low back issues requiring surgical intervention with Dr. Ellene Route several years ago.  He has rods in place.  Over the last 3 days he has been experiencing some low back pain that started on the left but now has migrated to both sides.  Pain is worse with certain movements and positions and is a sharp pain intermittently.  He denies any sensory changes, weakness.  No fecal incontinence, urinary retention or saddle anesthesia.  He is very physically active on a farm.  Additionally, he goes on to describe a recent event where he felt a pop in his right bicep.  He had subsequent bruising and still has some tenderness in that area.  He thinks he may have torn his bicep.  2.  Hypothyroidism History: No history of radiation or surgery to the neck.  No known family history of thyroid disorder.  Hypothyroidism was incidentally found on labs. Patient reports compliance with Synthroid.  He denies any heart palpitations, tremor, anxiety, insomnia, change in bowel habits or weight.  3.  Hyperlipidemia/hypertension Patient is compliant with Crestor 10 mg daily and Diovan 160 mg daily.  Does not report any shortness of breath, exercise  intolerance or chest pain.   ROS: Per HPI  Allergies  Allergen Reactions  . Morphine And Related Itching  . Lisinopril Cough   Past Medical History:  Diagnosis Date  . Allergy   . Carpal tunnel syndrome, bilateral   . Hx of colonic polyps   . Hyperlipidemia   . Hypertension   . Lower leg fracture   . Sleep apnea    CPAP  . Vitamin D deficiency     Current Outpatient Medications:  .  azelastine (ASTELIN) 0.1 % nasal spray, Place 2 sprays into both nostrils at bedtime. Use in each nostril as directed, Disp: 30 mL, Rfl: 2 .  baclofen (LIORESAL) 10 MG tablet, Take 1 tablet (10 mg total) by mouth 3 (three) times daily., Disp: 30 each, Rfl: 0 .  diclofenac (VOLTAREN) 75 MG EC tablet, TAKE 1 TABLET BY MOUTH TWICE A DAY, Disp: 30 tablet, Rfl: 0 .  fluticasone (FLONASE) 50 MCG/ACT nasal spray, PLACE 2 SPRAYS INTO BOTH NOSTRILS DAILY AS NEEDED., Disp: 48 mL, Rfl: 1 .  levothyroxine (SYNTHROID) 25 MCG tablet, TAKE 1 TABLET (25 MCG TOTAL) BY MOUTH DAILY BEFORE BREAKFAST., Disp: 90 tablet, Rfl: 2 .  rosuvastatin (CRESTOR) 10 MG tablet, TAKE 1 TABLET BY MOUTH EVERY DAY, Disp: 90 tablet, Rfl: 3 .  valsartan (DIOVAN) 160 MG tablet, Take 1 tablet (160 mg total) by mouth daily., Disp: 90 tablet, Rfl: 1 .  Vitamin D, Ergocalciferol, (DRISDOL) 50000 units CAPS capsule, TAKE 1 CAPSULE EVERY 7  DAYS, Disp: 12 capsule, Rfl: 3 Social History   Socioeconomic History  . Marital status: Married    Spouse name: linda  . Number of children: 2  . Years of education: Not on file  . Highest education level: Not on file  Occupational History  . Occupation: retired     Comment: Charity fundraiser  . Financial resource strain: Not on file  . Food insecurity    Worry: Not on file    Inability: Not on file  . Transportation needs    Medical: Not on file    Non-medical: Not on file  Tobacco Use  . Smoking status: Never Smoker  . Smokeless tobacco: Never Used  Substance and Sexual Activity  .  Alcohol use: Yes    Comment: occasional  . Drug use: No  . Sexual activity: Yes  Lifestyle  . Physical activity    Days per week: Not on file    Minutes per session: Not on file  . Stress: Not on file  Relationships  . Social Herbalist on phone: Not on file    Gets together: Not on file    Attends religious service: Not on file    Active member of club or organization: Not on file    Attends meetings of clubs or organizations: Not on file    Relationship status: Not on file  . Intimate partner violence    Fear of current or ex partner: Not on file    Emotionally abused: Not on file    Physically abused: Not on file    Forced sexual activity: Not on file  Other Topics Concern  . 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    Family History  Problem Relation Age of Onset  . Hypertension Mother   . Cancer Mother        uterine -METS to lungs  . Diabetes Mother   . Emphysema Father   . Heart disease Father   . Heart attack Brother 57       x 2   . Heart disease Brother   . Hypertension Brother   . Diabetes Brother   . Crohn's disease Son   . Heart attack Paternal Grandfather     Objective: Office vital signs reviewed. BP 112/62   Pulse 68   Temp 98.4 F (36.9 C) (Temporal)   Ht '5\' 7"'  (1.702 m)   Wt 219 lb (99.3 kg)   SpO2 96%   BMI 34.30 kg/m   Physical Examination:  General: Awake, alert, well nourished, No acute distress HEENT: Normal; sclera white.  No exophthalmos. Neck: No palpable lymph nodes, no thyroid enlargement or nodules. Cardio: regular rate and rhythm, S1S2 heard, no murmurs appreciated Pulm: clear to auscultation bilaterally, no wheezes, rhonchi or rales; normal work of breathing on room air Extremities: warm, well perfused, No edema, cyanosis or clubbing; +2 pulses bilaterally MSK: normal gait and station  C-spine: Has limited active range of motion in extension and rotation bilaterally.  Flexion fairly  preserved.  No midline tenderness palpation.  Lumbar spine: Active range of motion slightly reduced.  No midline tenderness palpation.  He does have tenderness palpation over bilateral SI joints.  There is increased tonicity over these areas.  Negative straight leg raise.  Right bicep: Popeye deformity noted.  He has some mild bruising proximally as well.  He has tenderness palpation over this area.  There is a palpable  defect at the proximal insertion Skin: dry; intact; no rashes or lesions Neuro: 5/5 LE Strength (RLE slightly weaker than left) and light touch sensation grossly intact  Assessment/ Plan: 69 y.o. male   1. Hypothyroidism (acquired) Asymptomatic.  Check thyroid panel - Thyroid Panel With TSH  2. Mixed hyperlipidemia Check fasting lipid panel, LFTs - CMP14+EGFR - Lipid Panel  3. Vitamin D deficiency disease Check vitamin D - Vitamin D 25 hydroxy  4. Benign prostatic hyperplasia, unspecified whether lower urinary tract symptoms present Check PSA.  Patient not on meds - PSA  5. Biceps tendon rupture, right, initial encounter High suspicion for at least a partial biceps tendon rupture on the right.  Suspect proximal tear.  Stat referral placed to Ortho for evaluation. - Ambulatory referral to Orthopedic Surgery  6. Cervical spondylosis with radiculopathy Ongoing PT.  Plan for MRI.  We will plan to refer back to [place] [facility] pending rehab and MRI.  I have renewed the Voltaren and baclofen.  We discussed that Voltaren has a risk of GI bleed.  Use sparingly. - baclofen (LIORESAL) 10 MG tablet; Take 1 tablet (10 mg total) by mouth 3 (three) times daily as needed for muscle spasms.  Dispense: 30 each; Refill: 1 - diclofenac (VOLTAREN) 75 MG EC tablet; Take 1 tablet (75 mg total) by mouth 2 (two) times daily. (prn back/ neck pain)  Dispense: 60 tablet; Refill: 0  7. Acute midline low back pain without sciatica With history of low back surgery many years ago.  I have  added additional physical therapy for the low back.  I have also renewed the medications as above.  We discussed reasons for urgent evaluation.  Low threshold to obtain MRI of back if starts developing concerning symptoms or signs or if back pain does not resolve - Ambulatory referral to Physical Therapy - baclofen (LIORESAL) 10 MG tablet; Take 1 tablet (10 mg total) by mouth 3 (three) times daily as needed for muscle spasms.  Dispense: 30 each; Refill: 1 - diclofenac (VOLTAREN) 75 MG EC tablet; Take 1 tablet (75 mg total) by mouth 2 (two) times daily. (prn back/ neck pain)  Dispense: 60 tablet; Refill: 0  8. History of back surgery - Ambulatory referral to Physical Therapy   Orders Placed This Encounter  Procedures  . CMP14+EGFR  . Lipid Panel  . Vitamin D 25 hydroxy  . Thyroid Panel With TSH  . PSA   Meds ordered this encounter  Medications  . baclofen (LIORESAL) 10 MG tablet    Sig: Take 1 tablet (10 mg total) by mouth 3 (three) times daily as needed for muscle spasms.    Dispense:  30 each    Refill:  1  . diclofenac (VOLTAREN) 75 MG EC tablet    Sig: Take 1 tablet (75 mg total) by mouth 2 (two) times daily. (prn back/ neck pain)    Dispense:  60 tablet    Refill:  0     Betsy Rosello Windell Moulding, DO [place] [phone]

## 2018-12-25 NOTE — Patient Instructions (Addendum)
You had labs performed today.  You will be contacted with the results of the labs once they are available, usually in the next 3 business days for routine lab work.  If you have an active my chart account, they will be released to your MyChart.  If you prefer to have these labs released to you via telephone, please let us know.  If you had a pap smear or biopsy performed, expect to be contacted in about 7-10 days.   Proximal Biceps Tendon Tear  The proximal biceps tendon is a strong cord of tissue that connects the biceps muscle-which is the muscle on the front of the upper arm-to the shoulder blade. A proximal biceps tendon tear (rupture) can include a partial or complete tear of the tendon near where it connects to the bone of the shoulder. This injury can interfere with your ability to lift your arm in front of your body, stabilize your shoulder, bend your elbow, and turn your hand palm-up (supination). What are the causes? This condition happens when too much force is placed on the tendon. This excess force may be caused by:  The elbow being suddenly straightened from a bent position because of an external force. This could happen, for example, while catching a heavy weight or being pulled when waterskiing.  Wear and tear from physical activity.  Breaking a fall with your hand. What increases the risk? The following factors may make you more likely to develop this condition:  Playing contact sports.  Doing activities or sports that involve throwing or overhead movements, such as racket sports, gymnastics, or baseball.  Doing activities or sports that involve putting sudden force on the arm, such as weight lifting or waterskiing.  Having a weakened tendon because of: ? Long-lasting (chronic) biceps tendinitis. ? Certain medical conditions, such as diabetes or rheumatoid arthritis. ? Repeated corticosteroid use. ? Repetitive overhead movements. What are the signs or symptoms?  Symptoms of this condition may include:  Sudden sharp pain in the front of the shoulder. Pain may get worse during certain movements, such as: ? Lifting or carrying objects. ? Straightening the elbow. ? Throwing or using overhead movements.  Inflammation or a feeling of unusual warmth on the front of the shoulder.  Painful tightening (spasm) of the biceps muscle.  A bulge on the inside of the upper arm when the elbow is bent.  Bruising in the shoulder or upper arm. This may develop 24-48 hours after the tendon is injured.  Limited range of motion of the shoulder and elbow.  Weakness in the elbow and forearm when: ? Bending the elbow. ? Rotating the wrist. How is this diagnosed? This condition may be diagnosed based on:  Your symptoms and medical history.  A physical exam. Your health care provider may test the strength and range of motion of your shoulder and elbow.  Imaging tests, such as: ? X-rays. ? MRI. ? Ultrasound. How is this treated? Treatment depends on the severity of the condition. Treatment may include:  Medicines to help relieve pain and inflammation.  Resting and icing the injured area.  Avoiding certain activities that put stress on your shoulder.  Physical therapy.  Surgery to repair the tear. This may be needed if nonsurgical treatments do not improve your condition.  One or more injections of medicines (corticosteroids) into your upper arm to help reduce inflammation. This treatment is rare. Follow these instructions at home: Managing pain, stiffness, and swelling      If directed,  put ice on the injured area: ? Put ice in a plastic bag. ? Place a towel between your skin and the bag. ? Leave the ice on for 20 minutes, 2-3 times a day.  If directed, apply heat to the affected area before you exercise or as often as told by your health care provider. Use the heat source that your health care provider recommends, such as a moist heat pack or a  heating pad. ? Place a towel between your skin and the heat source. ? Leave the heat on for 20-30 minutes. ? Remove the heat if your skin turns bright red. This is especially important if you are unable to feel pain, heat, or cold. You may have a greater risk of getting burned.  Move your fingers often to avoid stiffness and to lessen swelling.  Raise (elevate) the injured area while you are sitting or lying down. Activity  Return to your normal activities as told by your health care provider. Ask your health care provider what activities are safe for you.  Avoid activities that cause pain or make your condition worse.  Do not lift anything that is heavier than 10 lb (4.5 kg), or the limit that you are told, until your health care provider says that it is safe.  Do exercises as told by your physical therapist or health care provider. General instructions  Take over-the-counter and prescription medicines only as told by your health care provider.  Do not use any products that contain nicotine or tobacco, such as cigarettes and e-cigarettes. If you need help quitting, ask your health care provider.  Keep all follow-up visits as told by your health care provider. This is important. How is this prevented? Take these steps to help prevent reinjury:  Warm up and stretch before being active.  Cool down and stretch after being active.  Give your body time to rest between periods of activity.  Make sure you use equipment that fits you.  Be safe and responsible while being active. This will help you avoid falls.  Maintain physical fitness, including strength and flexibility. Contact a health care provider if:  You have symptoms that get worse or do not get better after 2 weeks of treatment.  You develop new symptoms. Get help right away if:  You have severe pain.  You develop pain or numbness in your hand.  Your hand feels unusually cold.  Your fingernails turn a dark color,  such as blue or gray. Summary  A proximal biceps tendon tear can include a partial or complete tear of the tendon near where it connects to the bone of the shoulder.  This condition happens when too much force is placed on the tendon.  Treatment may include resting and icing the injured area.  Avoid activities that cause pain or make your condition worse. This information is not intended to replace advice given to you by your health care provider. Make sure you discuss any questions you have with your health care provider. Document Released: 01/29/2005 Document Revised: 01/20/2018 Document Reviewed: 06/24/2017 Elsevier Patient Education  2020 [employer].

## 2018-12-25 NOTE — Therapy (Signed)
[facility] [place], [place], [address] Phone: [phone]   Fax:  [phone]  Physical Therapy Treatment  Patient Details  Name: Daniel Macias MRN: [id number] Date of Birth: Jan 12, 1951 Referring Provider (PT): Evelina Dun, FNP   Encounter Date: 12/25/2018  PT End of Session - 12/25/18 1016    Visit Number  5    Number of Visits  12    Date for PT Re-Evaluation  01/29/19    Authorization Type  FOTO; Progress note every 10th visit; KX modifier at 15th visit    PT Start Time  0945    PT Stop Time  1031    PT Time Calculation (min)  46 min    Activity Tolerance  Patient tolerated treatment well    Behavior During Therapy  [facility] for tasks assessed/performed       Past Medical History:  Diagnosis Date  . Allergy   . Carpal tunnel syndrome, bilateral   . Hx of colonic polyps   . Hyperlipidemia   . Hypertension   . Lower leg fracture   . Sleep apnea    CPAP  . Vitamin D deficiency     Past Surgical History:  Procedure Laterality Date  . CYST REMOVAL HAND Right    wrist   . LEG SURGERY Left 1971  . [place]   DDD    There were no vitals filed for this visit.  Subjective Assessment - 12/25/18 0946    Subjective  COVID-19 screening performed upon arrival. Patient reports ongoing cervical pain yet did well after last treatment. Patient tore bicep tendon on saturday and unable to use right UE today    Pertinent History  HTN, right knee meniscus repair 09/2018,    Limitations  Sitting;Reading;Lifting;House hold activities    Diagnostic tests  X-ray: mulitlevel degenerative changes, see media    Patient Stated Goals  decrease pain, stop numbness and tingling    Currently in Pain?  Yes    Pain Score  1     Pain Location  Neck    Pain Orientation  Right    Pain Descriptors / Indicators  Tightness    Pain Type  Chronic pain    Pain Onset  More than a month ago    Pain Frequency  Constant     Aggravating Factors   certain movements    Pain Relieving Factors  at rest         [facility] PT Assessment - 12/25/18 0001      AROM   AROM Assessment Site  Cervical    Cervical - Right Rotation  49    Cervical - Left Rotation  51                   [facility] Adult PT Treatment/Exercise - 12/25/18 0001      Moist Heat Therapy   Number Minutes Moist Heat  10 Minutes    Moist Heat Location  Cervical      Electrical Stimulation   Electrical Stimulation Location  right cervical paraspinals and right UT    Electrical Stimulation Action  premod    Electrical Stimulation Parameters  80-'150hz'  x42mn    Electrical Stimulation Goals  Pain      Traction   Type of Traction  Cervical    Min (lbs)  5    Max (lbs)  17    Hold Time  99    Rest Time  5  Time  15      Manual Therapy   Manual Therapy  Soft tissue mobilization    Soft tissue mobilization  STW/M to right cervical paraspinals to decrease tone and pain                  PT Long Term Goals - 12/25/18 1014      PT LONG TERM GOAL #1   Title  Patient will be independent with HEP    Time  6    Period  Weeks    Status  On-going      PT LONG TERM GOAL #2   Title  Patient will demonstrate 55+ degrees of cervical rotation to improve ability to scan environment look over shoulder during driving    Period  Weeks    Status  On-going      PT LONG TERM GOAL #3   Title  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    Time  6    Period  Weeks    Status  Achieved   no more than 2/10 per reported 12/25/18     PT LONG TERM GOAL #4   Title  Patient will report a decrease in neurological symptoms to bilateral right UEs to reduce nerve irritation    Time  6    Period  Weeks    Status  On-going   ongoing esp at night 12/25/18     PT LONG TERM GOAL #5   Title  Patient will demonstrate 80 lbs of right grip force or equal to left to improve strength.     Time  6    Period  Weeks    Status  On-going            Plan - 12/25/18 1018    Clinical Impression Statement  Patient tolerated treatment well today. Patient was limited due to recent bicep tear yet able to focus on cervical only today. Performed manual STW to right cervical paraspinals to decrease tightness then followed by modalities and traction. Patient responded well last treatment so increased to 17# per PT. Patient continues to have right UE symptoms at night. Patient has reported decreased pain with farm activities and met LTG #3 today. Improved cervical ROM yet other goals ongoing.    Personal Factors and Comorbidities  Age;Comorbidity 1    Examination-Activity Limitations  Lift    Examination-Participation Restrictions  Meal Prep;Yard Work    Stability/Clinical Decision Making  Stable/Uncomplicated    Rehab Potential  Good    PT Frequency  2x / week    PT Treatment/Interventions  ADLs/Self Care Home Management;Cryotherapy;Electrical Stimulation;Moist Heat;Traction;Ultrasound;Therapeutic activities;Therapeutic exercise;Patient/family education;Dry needling;Passive range of motion;Manual techniques;Spinal Manipulations    PT Next Visit Plan  cont with POC for cervical ROM, ther ex and strengthening as tolerated. No right UE due to bicep tear. modalities    Consulted and Agree with Plan of Care  Patient       Patient will benefit from skilled therapeutic intervention in order to improve the following deficits and impairments:  Decreased activity tolerance, Decreased range of motion, Postural dysfunction, Pain, Decreased strength  Visit Diagnosis: Cervicalgia  Radiculopathy, cervical region     Problem List Patient Active Problem List   Diagnosis Date Noted  . Cervical spondylosis with radiculopathy 12/25/2018  . Biceps tendon rupture, right, initial encounter 12/25/2018  . Hypothyroidism (acquired) 12/01/2017  . Palpitation 09/30/2013  . Allergic rhinitis  05/27/2013   . Hypertension 05/27/2013  . Metabolic syndrome 66/07/43  . BPH (benign prostatic hyperplasia) 11/25/2012  . Obstructive sleep apnea 11/25/2012  . Hyperlipemia 05/28/2012  . Vitamin D deficiency disease 05/28/2012    Phillips Climes, PTA 12/25/2018, 10:40 AM  [facility] [place], [place], [address] Phone: [phone]   Fax:  [phone]  Name: Daniel Macias MRN: [id number] Date of Birth: [birth year]

## 2018-12-26 LAB — CMP14+EGFR
ALT: 23 IU/L (ref 0–44)
AST: 25 IU/L (ref 0–40)
Albumin/Globulin Ratio: 1.5 (ref 1.2–2.2)
Albumin: 4.3 g/dL (ref 3.8–4.8)
Alkaline Phosphatase: 79 IU/L (ref 39–117)
BUN/Creatinine Ratio: 16 (ref 10–24)
BUN: 14 mg/dL (ref 8–27)
Bilirubin Total: 0.4 mg/dL (ref 0.0–1.2)
CO2: 21 mmol/L (ref 20–29)
Calcium: 8.9 mg/dL (ref 8.6–10.2)
Chloride: 104 mmol/L (ref 96–106)
Creatinine, Ser: 0.87 mg/dL (ref 0.76–1.27)
GFR calc Af Amer: 101 mL/min/{1.73_m2} (ref >59)
GFR calc non Af Amer: 87 mL/min/{1.73_m2} (ref >59)
Globulin, Total: 2.8 g/dL (ref 1.5–4.5)
Glucose: 114 mg/dL — ABNORMAL HIGH (ref 65–99)
Potassium: 4.6 mmol/L (ref 3.5–5.2)
Sodium: 139 mmol/L (ref 134–144)
Total Protein: 7.1 g/dL (ref 6.0–8.5)

## 2018-12-26 LAB — PSA: Prostate Specific Ag, Serum: 1.2 ng/mL (ref 0.0–4.0)

## 2018-12-26 LAB — THYROID PANEL WITH TSH
Free Thyroxine Index: 1.6 (ref 1.2–4.9)
T3 Uptake Ratio: 27 % (ref 24–39)
T4, Total: 6 ug/dL (ref 4.5–12.0)
TSH: 4.02 u[IU]/mL (ref 0.450–4.500)

## 2018-12-26 LAB — LIPID PANEL
Chol/HDL Ratio: 2.3 ratio (ref 0.0–5.0)
Cholesterol, Total: 119 mg/dL (ref 100–199)
HDL: 52 mg/dL (ref >39)
LDL Chol Calc (NIH): 52 mg/dL (ref 0–99)
Triglycerides: 72 mg/dL (ref 0–149)
VLDL Cholesterol Cal: 15 mg/dL (ref 5–40)

## 2018-12-26 LAB — VITAMIN D 25 HYDROXY (VIT D DEFICIENCY, FRACTURES): Vit D, 25-Hydroxy: 32.4 ng/mL (ref 30.0–100.0)

## 2018-12-29 ENCOUNTER — Other Ambulatory Visit: Payer: Self-pay | Admitting: Family Medicine

## 2018-12-29 DIAGNOSIS — E039 Hypothyroidism, unspecified: Secondary | ICD-10-CM

## 2018-12-29 MED ORDER — LEVOTHYROXINE SODIUM 25 MCG PO TABS: 25.0000 ug | ORAL_TABLET | Freq: Every day | ORAL | 3 refills | Status: DC

## 2018-12-30 ENCOUNTER — Encounter: Payer: Self-pay | Admitting: Physical Therapy

## 2018-12-30 ENCOUNTER — Ambulatory Visit: Payer: Medicare HMO | Admitting: Physical Therapy

## 2018-12-30 ENCOUNTER — Other Ambulatory Visit: Payer: Self-pay

## 2018-12-30 DIAGNOSIS — M542 Cervicalgia: Secondary | ICD-10-CM

## 2018-12-30 DIAGNOSIS — M5412 Radiculopathy, cervical region: Secondary | ICD-10-CM

## 2018-12-30 NOTE — Therapy (Signed)
[facility] [place], [place], [address] Phone: [phone]   Fax:  [phone]  Physical Therapy Treatment  Patient Details  Name: Daniel Macias MRN: [id number] Date of Birth: 17-Sep-1950 Referring Provider (PT): Evelina Dun, FNP   Encounter Date: 12/30/2018  PT End of Session - 12/30/18 1021    Visit Number  6    Number of Visits  12    Date for PT Re-Evaluation  01/29/19    Authorization Type  FOTO; Progress note every 10th visit; KX modifier at 15th visit    PT Start Time  0909    PT Stop Time  1000    PT Time Calculation (min)  51 min    Activity Tolerance  Patient tolerated treatment well    Behavior During Therapy  [facility] for tasks assessed/performed       Past Medical History:  Diagnosis Date  . Allergy   . Carpal tunnel syndrome, bilateral   . Hx of colonic polyps   . Hyperlipidemia   . Hypertension   . Lower leg fracture   . Sleep apnea    CPAP  . Vitamin D deficiency     Past Surgical History:  Procedure Laterality Date  . CYST REMOVAL HAND Right    wrist   . LEG SURGERY Left 1971  . [place]   DDD    There were no vitals filed for this visit.  Subjective Assessment - 12/30/18 1017    Subjective  COVID-19 screen performed prior to patient entering clinic.  My neck is better.    Pertinent History  HTN, right knee meniscus repair 09/2018,    Limitations  Sitting;Reading;Lifting;House hold activities    Diagnostic tests  X-ray: mulitlevel degenerative changes, see media    Patient Stated Goals  decrease pain, stop numbness and tingling    Currently in Pain?  Yes    Pain Score  1     Pain Location  Neck    Pain Orientation  Right    Pain Descriptors / Indicators  Tightness    Pain Type  Chronic pain    Pain Onset  More than a month ago    Pain Frequency  Constant                       [facility] Adult PT Treatment/Exercise - 12/30/18 0001      Ultrasound   Ultrasound Location  RT UT/RT cervical paraspinals musculature.    Ultrasound Parameters  Combo e'stim/U/S at 1.50 W/CM2 x 12 minutes.      Traction   Type of Traction  Cervical    Min (lbs)  5    Max (lbs)  19    Hold Time  99    Rest Time  5    Time  15      Manual Therapy   Manual Therapy  Soft tissue mobilization    Soft tissue mobilization  STW/M x 12 minuteds to reduce tone.                  PT Long Term Goals - 12/25/18 1014      PT LONG TERM GOAL #1   Title  Patient will be independent with HEP    Time  6    Period  Weeks    Status  On-going      PT LONG TERM GOAL #2   Title  Patient will demonstrate 55+ degrees of  cervical rotation to improve ability to scan environment look over shoulder during driving    Period  Weeks    Status  On-going      PT LONG TERM GOAL #3   Title  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    Time  6    Period  Weeks    Status  Achieved   no more than 2/10 per reported 12/25/18     PT LONG TERM GOAL #4   Title  Patient will report a decrease in neurological symptoms to bilateral right UEs to reduce nerve irritation    Time  6    Period  Weeks    Status  On-going   ongoing esp at night 12/25/18     PT LONG TERM GOAL #5   Title  Patient will demonstrate 80 lbs of right grip force or equal to left to improve strength.    Time  6    Period  Weeks    Status  On-going            Plan - 12/30/18 1026    Clinical Impression Statement  Patient doing very well with treatment.  No pain complaints following.  He did great with a 2# increase in cervical traction today.    Personal Factors and Comorbidities  Age;Comorbidity 1    Examination-Activity Limitations  Lift    Examination-Participation Restrictions  Meal Prep;Yard Work    Stability/Clinical Decision Making  Stable/Uncomplicated    Rehab Potential  Good    PT Frequency  2x / week    PT Duration  6  weeks    PT Treatment/Interventions  ADLs/Self Care Home Management;Cryotherapy;Electrical Stimulation;Moist Heat;Traction;Ultrasound;Therapeutic activities;Therapeutic exercise;Patient/family education;Dry needling;Passive range of motion;Manual techniques;Spinal Manipulations    PT Next Visit Plan  cont with POC for cervical ROM, ther ex and strengthening as tolerated. No right UE due to bicep tear. modalities    PT Home Exercise Plan  see patient education section    Consulted and Agree with Plan of Care  Patient       Patient will benefit from skilled therapeutic intervention in order to improve the following deficits and impairments:  Decreased activity tolerance, Decreased range of motion, Postural dysfunction, Pain, Decreased strength  Visit Diagnosis: Cervicalgia  Radiculopathy, cervical region     Problem List Patient Active Problem List   Diagnosis Date Noted  . Cervical spondylosis with radiculopathy 12/25/2018  . Biceps tendon rupture, right, initial encounter 12/25/2018  . Hypothyroidism (acquired) 12/01/2017  . Palpitation 09/30/2013  . Allergic rhinitis 05/27/2013  . Hypertension 05/27/2013  . Metabolic syndrome [id number]  . BPH (benign prostatic hyperplasia) 11/25/2012  . Obstructive sleep apnea 11/25/2012  . Hyperlipemia 05/28/2012  . Vitamin D deficiency disease 05/28/2012    Brita Jurgensen, [place] MPT 12/30/2018, 10:27 AM  [facility] [address] [place], [place], [address] Phone: [phone]   Fax:  [phone]  Name: Daniel Macias MRN: [id number] Date of Birth: 1951/01/08

## 2018-12-31 DIAGNOSIS — M5412 Radiculopathy, cervical region: Secondary | ICD-10-CM | POA: Diagnosis not present

## 2018-12-31 DIAGNOSIS — M542 Cervicalgia: Secondary | ICD-10-CM | POA: Diagnosis not present

## 2018-12-31 DIAGNOSIS — S134XXA Sprain of ligaments of cervical spine, initial encounter: Secondary | ICD-10-CM | POA: Diagnosis not present

## 2018-12-31 DIAGNOSIS — Z6835 Body mass index (BMI) 35.0-35.9, adult: Secondary | ICD-10-CM | POA: Diagnosis not present

## 2018-12-31 DIAGNOSIS — M5030 Other cervical disc degeneration, unspecified cervical region: Secondary | ICD-10-CM | POA: Diagnosis not present

## 2019-01-01 ENCOUNTER — Ambulatory Visit: Payer: Medicare HMO | Admitting: Physical Therapy

## 2019-01-01 ENCOUNTER — Other Ambulatory Visit: Payer: Self-pay

## 2019-01-01 ENCOUNTER — Encounter: Payer: Self-pay | Admitting: Physical Therapy

## 2019-01-01 DIAGNOSIS — M542 Cervicalgia: Secondary | ICD-10-CM | POA: Diagnosis not present

## 2019-01-01 DIAGNOSIS — M5412 Radiculopathy, cervical region: Secondary | ICD-10-CM

## 2019-01-01 NOTE — Therapy (Signed)
[facility] [place], [place], [address] Phone: [phone]   Fax:  [phone]  Physical Therapy Treatment  Patient Details  Name: Daniel Macias MRN: [id number] Date of Birth: 1950-08-07 Referring Provider (PT): Evelina Dun, FNP   Encounter Date: 01/01/2019  PT End of Session - 01/01/19 0935    Visit Number  7    Number of Visits  12    Date for PT Re-Evaluation  01/29/19    Authorization Type  FOTO; Progress note every 10th visit; KX modifier at 15th visit    PT Start Time  0901    PT Stop Time  0947    PT Time Calculation (min)  46 min    Activity Tolerance  Patient tolerated treatment well    Behavior During Therapy  [facility] for tasks assessed/performed       Past Medical History:  Diagnosis Date  . Allergy   . Carpal tunnel syndrome, bilateral   . Hx of colonic polyps   . Hyperlipidemia   . Hypertension   . Lower leg fracture   . Sleep apnea    CPAP  . Vitamin D deficiency     Past Surgical History:  Procedure Laterality Date  . CYST REMOVAL HAND Right    wrist   . LEG SURGERY Left 1971  . [place]   DDD    There were no vitals filed for this visit.  Subjective Assessment - 01/01/19 0908    Subjective  COVID-19 screen performed prior to patient entering clinic.  Patient reported overall improvement    Pertinent History  HTN, right knee meniscus repair 09/2018,    Limitations  Sitting;Reading;Lifting;House hold activities    Diagnostic tests  X-ray: mulitlevel degenerative changes, see media    Patient Stated Goals  decrease pain, stop numbness and tingling    Currently in Pain?  Yes    Pain Score  1     Pain Location  Neck    Pain Orientation  Right    Pain Descriptors / Indicators  Tightness    Pain Type  Chronic pain    Pain Onset  More than a month ago    Pain Frequency  Constant    Aggravating Factors   certain movement    Pain Relieving Factors  at rest         [facility] PT  Assessment - 01/01/19 0001      AROM   AROM Assessment Site  Cervical    Cervical - Right Rotation  55    Cervical - Left Rotation  56                   [facility] Adult PT Treatment/Exercise - 01/01/19 0001      Ultrasound   Ultrasound Location  Right UT/cervical paraspinals    Ultrasound Parameters  combo US/ES @ 1.5w/cm2/100%/96mz x185m    Ultrasound Goals  Pain      Traction   Type of Traction  Cervical    Min (lbs)  5    Max (lbs)  20    Hold Time  99    Rest Time  5    Time  15      Manual Therapy   Manual Therapy  Soft tissue mobilization    Soft tissue mobilization  manual STW to right UT and cervical paraspinals to reduce pain and tone  PT Long Term Goals - 01/01/19 0936      PT LONG TERM GOAL #1   Title  Patient will be independent with HEP    Period  Weeks    Status  On-going      PT LONG TERM GOAL #2   Title  Patient will demonstrate 55+ degrees of cervical rotation to improve ability to scan environment look over shoulder during driving    Time  6    Period  Weeks    Status  Achieved   MET 01/01/19     PT LONG TERM GOAL #3   Title  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    Time  6    Period  Weeks    Status  Achieved      PT LONG TERM GOAL #4   Title  Patient will report a decrease in neurological symptoms to bilateral right UEs to reduce nerve irritation    Time  6    Period  Weeks    Status  On-going      PT LONG TERM GOAL #5   Title  Patient will demonstrate 80 lbs of right grip force or equal to left to improve strength.    Time  6    Period  Weeks    Status  On-going            Plan - 01/01/19 0950    Clinical Impression Statement  Patient tolerated treatment well today. patient has improved with ROM and less pain. Patien has ongoing symptoms at night yet improved. Patient met ROM goals today with others ongoing. Patient is  going to get a MRI in december. Patient responded well to traction today.    Personal Factors and Comorbidities  Age;Comorbidity 1    Examination-Activity Limitations  Lift    Examination-Participation Restrictions  Meal Prep;Yard Work    Stability/Clinical Decision Making  Stable/Uncomplicated    Rehab Potential  Good    PT Frequency  2x / week    PT Duration  6 weeks    PT Treatment/Interventions  ADLs/Self Care Home Management;Cryotherapy;Electrical Stimulation;Moist Heat;Traction;Ultrasound;Therapeutic activities;Therapeutic exercise;Patient/family education;Dry needling;Passive range of motion;Manual techniques;Spinal Manipulations    PT Next Visit Plan  cont with POC for cervical ROM, ther ex and strengthening as tolerated. No right UE due to bicep tear. modalities    Consulted and Agree with Plan of Care  Patient       Patient will benefit from skilled therapeutic intervention in order to improve the following deficits and impairments:  Decreased activity tolerance, Decreased range of motion, Postural dysfunction, Pain, Decreased strength  Visit Diagnosis: Radiculopathy, cervical region  Cervicalgia     Problem List Patient Active Problem List   Diagnosis Date Noted  . Cervical spondylosis with radiculopathy 12/25/2018  . Biceps tendon rupture, right, initial encounter 12/25/2018  . Hypothyroidism (acquired) 12/01/2017  . Palpitation 09/30/2013  . Allergic rhinitis 05/27/2013  . Hypertension 05/27/2013  . Metabolic syndrome 03/47/4259  . BPH (benign prostatic hyperplasia) 11/25/2012  . Obstructive sleep apnea 11/25/2012  . Hyperlipemia 05/28/2012  . Vitamin D deficiency disease 05/28/2012    Phillips Climes, PTA 01/01/2019, 9:57 AM  [facility] [place], [place], [address] Phone: [phone]   Fax:  [phone]  Name: Daniel Macias MRN: [id number] Date of Birth: 10/02/1950

## 2019-01-03 DIAGNOSIS — G4733 Obstructive sleep apnea (adult) (pediatric): Secondary | ICD-10-CM | POA: Diagnosis not present

## 2019-01-06 ENCOUNTER — Encounter: Payer: Self-pay | Admitting: Physical Therapy

## 2019-01-06 ENCOUNTER — Ambulatory Visit: Payer: Medicare HMO | Admitting: Physical Therapy

## 2019-01-06 ENCOUNTER — Other Ambulatory Visit: Payer: Self-pay | Admitting: Family Medicine

## 2019-01-06 ENCOUNTER — Other Ambulatory Visit: Payer: Self-pay

## 2019-01-06 DIAGNOSIS — M542 Cervicalgia: Secondary | ICD-10-CM

## 2019-01-06 DIAGNOSIS — M5412 Radiculopathy, cervical region: Secondary | ICD-10-CM

## 2019-01-06 MED ORDER — VALSARTAN 160 MG PO TABS: 160.0000 mg | ORAL_TABLET | Freq: Every day | ORAL | 1 refills | Status: DC

## 2019-01-06 NOTE — Patient Instructions (Signed)
[facility]  DRY NEEDLING CONSENT FORM   Trigger point dry needling is a physical therapy approach to treat Myofascial Pain and Dysfunction.  Dry Needling (DN) is a valuable and effective way to deactivate myofascial trigger points (muscle knots/pain). It is skilled intervention that uses a thin filiform needle to penetrate the skin and stimulate underlying myofascial trigger points, muscular, and connective tissues for the management of neuromusculoskeletal pain and movement impairments.  A local twitch response (LTR) will be elicited.  This can sometimes feel like a deep ache in the muscle during the procedure. Multiple trigger points in multiple muscles can be treated during each treatment.  No medication of any kind is injected.   As with any medical treatment and procedure, there are possible adverse events.  While significant adverse events are uncommon, they do sometimes occur and must be considered prior to giving consent.  1. Dry needling often causes a "post needling soreness".  There can be an increase in pain from a couple of hours to 2-3 days, followed by an improvement in the overall pain state. 2. Any time a needle is used there is a risk of infection.  However, we are using new, sterile, and disposable needles; infections are extremely rare. 3. There is a possibility that you may bleed or bruise.  You may feel tired and some nausea following treatment. 4. There is a rare possibility of a pneumothorax (air in the chest cavity). 5. Allergic reaction to nickel in the stainless steel needle. 6. If a nerve is touched, it may cause paresthesia (a prickling/shock sensation) which is usually brief, but may continue for a couple of days.  Following treatment stay hydrated.  Continue regular activities but not too vigorous initially after treatment for 24-48 hours.  Dry Needling is best when combined with other physical therapy interventions such as  strengthening, stretching and other therapeutic modalities.   PLEASE ANSWER THE FOLLOWING QUESTIONS:  Do you have a lack of sensation?   Y/N  Do you have a phobia or fear of needles  Y/N  Are you pregnant?    Y/N If yes:  How many weeks? __________ Do you have any implanted devices?  Y/N If yes:  Pacemaker/Spinal Cord Stimulator/Deep Brain Stimulator/Insulin Pump/Other: ________________ Do you have any implants?  Y/N If yes: Breast/Facial/Pecs/Buttocks/Calves/Hip  Replacement/ Knee Replacement/Other: _________ Do you take any blood thinners?   Y/N If yes: Coumadin (Warfarin)/Other: ___________________ Do you have a bleeding disorder?   Y/N If yes: What kind: _________________________________ Do you take any immunosuppressants?  Y/N If yes:   What kind: _________________________________ Do you take anti-inflammatories?   Y/N If yes: What kind: Advil/Aspirin/Other: ________________ Have you ever been diagnosed with Scoliosis? Y/N Have you had back surgery?   Y/N If yes:  Laminectomy/Fusion/Other: ___________________   I have read, or had read to me, the above.  I have had the opportunity to ask any questions.  All of my questions have been answered to my satisfaction and I understand the risks involved with dry needling.  I consent to [facility] and treatment [facility], including dry needling, of any and all of my involved and affected muscles.  

## 2019-01-06 NOTE — Therapy (Signed)
[facility] [place], [place], [address] Phone: [phone]   Fax:  [phone]  Physical Therapy Treatment  Patient Details  Name: Daniel Macias MRN: [id number] Date of Birth: 09-19-1950 Referring Provider (PT): Evelina Dun, FNP   Encounter Date: 01/06/2019  PT End of Session - 01/06/19 1210    Visit Number  8    Number of Visits  12    Date for PT Re-Evaluation  01/29/19    Authorization Type  FOTO; Progress note every 10th visit; KX modifier at 15th visit    PT Start Time  0902    PT Stop Time  0955    PT Time Calculation (min)  53 min    Activity Tolerance  Patient tolerated treatment well    Behavior During Therapy  [facility] for tasks assessed/performed       Past Medical History:  Diagnosis Date  . Allergy   . Carpal tunnel syndrome, bilateral   . Hx of colonic polyps   . Hyperlipidemia   . Hypertension   . Lower leg fracture   . Sleep apnea    CPAP  . Vitamin D deficiency     Past Surgical History:  Procedure Laterality Date  . CYST REMOVAL HAND Right    wrist   . LEG SURGERY Left 1971  . [place]   DDD    There were no vitals filed for this visit.  Subjective Assessment - 01/06/19 1204    Subjective  COVID-19 screen performed prior to patient entering clinic.  Neck doing better.  Patient considering dry needling prior to receiving an injection.    Pertinent History  HTN, right knee meniscus repair 09/2018,    Diagnostic tests  X-ray: mulitlevel degenerative changes, see media    Patient Stated Goals  decrease pain, stop numbness and tingling    Currently in Pain?  Yes    Pain Score  1     Pain Location  Neck    Pain Orientation  Right    Pain Descriptors / Indicators  Tightness    Pain Onset  More than a month ago                       [facility] Adult PT Treatment/Exercise - 01/06/19 0001      Modalities   Modalities  Traction      Ultrasound   Ultrasound  Location  Affected cervical.    Ultrasound Parameters  Combo e'stim/u/S at 1.50 W/CM2 x 12 minutes.    Ultrasound Goals  Pain      Traction   Type of Traction  Cervical    Min (lbs)  5    Max (lbs)  22    Hold Time  99    Rest Time  5    Time  15      Manual Therapy   Manual Therapy  Soft tissue mobilization    Soft tissue mobilization  STW/M x 12 minutes to decrease tone.                  PT Long Term Goals - 01/01/19 0936      PT LONG TERM GOAL #1   Title  Patient will be independent with HEP    Period  Weeks    Status  On-going      PT LONG TERM GOAL #2   Title  Patient will demonstrate 55+ degrees of cervical rotation to improve  ability to scan environment look over shoulder during driving    Time  6    Period  Weeks    Status  Achieved   MET 01/01/19     PT LONG TERM GOAL #3   Title  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    Time  6    Period  Weeks    Status  Achieved      PT LONG TERM GOAL #4   Title  Patient will report a decrease in neurological symptoms to bilateral right UEs to reduce nerve irritation    Time  6    Period  Weeks    Status  On-going      PT LONG TERM GOAL #5   Title  Patient will demonstrate 80 lbs of right grip force or equal to left to improve strength.    Time  6    Period  Weeks    Status  On-going            Plan - 01/06/19 1210    Clinical Impression Statement  Patient doing better overall.  He is considering dry needling.  Consent form provided today.  He tolerated a 2# increase in traction today without complaint.    Personal Factors and Comorbidities  Age;Comorbidity 1    Examination-Activity Limitations  Lift    Stability/Clinical Decision Making  Stable/Uncomplicated    Rehab Potential  Good    PT Frequency  2x / week    PT Duration  6 weeks    PT Treatment/Interventions  ADLs/Self Care Home Management;Cryotherapy;Electrical  Stimulation;Moist Heat;Traction;Ultrasound;Therapeutic activities;Therapeutic exercise;Patient/family education;Dry needling;Passive range of motion;Manual techniques;Spinal Manipulations    PT Next Visit Plan  cont with POC for cervical ROM, ther ex and strengthening as tolerated. No right UE due to bicep tear. modalities    PT Home Exercise Plan  see patient education section    Consulted and Agree with Plan of Care  Patient       Patient will benefit from skilled therapeutic intervention in order to improve the following deficits and impairments:  Decreased activity tolerance, Decreased range of motion, Postural dysfunction, Pain, Decreased strength  Visit Diagnosis: Radiculopathy, cervical region  Cervicalgia     Problem List Patient Active Problem List   Diagnosis Date Noted  . Cervical spondylosis with radiculopathy 12/25/2018  . Biceps tendon rupture, right, initial encounter 12/25/2018  . Hypothyroidism (acquired) 12/01/2017  . Palpitation 09/30/2013  . Allergic rhinitis 05/27/2013  . Hypertension 05/27/2013  . Metabolic syndrome 72/62/0355  . BPH (benign prostatic hyperplasia) 11/25/2012  . Obstructive sleep apnea 11/25/2012  . Hyperlipemia 05/28/2012  . Vitamin D deficiency disease 05/28/2012    Daniel Macias, [place] MPT 01/06/2019, 12:13 PM  [facility] [address] [place], [place], [address] Phone: [phone]   Fax:  [phone]  Name: Daniel Macias MRN: [id number] Date of Birth: [birth year]

## 2019-01-12 DIAGNOSIS — Z809 Family history of malignant neoplasm, unspecified: Secondary | ICD-10-CM | POA: Diagnosis not present

## 2019-01-12 DIAGNOSIS — Z885 Allergy status to narcotic agent status: Secondary | ICD-10-CM | POA: Diagnosis not present

## 2019-01-12 DIAGNOSIS — Z8249 Family history of ischemic heart disease and other diseases of the circulatory system: Secondary | ICD-10-CM | POA: Diagnosis not present

## 2019-01-12 DIAGNOSIS — I10 Essential (primary) hypertension: Secondary | ICD-10-CM | POA: Diagnosis not present

## 2019-01-12 DIAGNOSIS — Z825 Family history of asthma and other chronic lower respiratory diseases: Secondary | ICD-10-CM | POA: Diagnosis not present

## 2019-01-12 DIAGNOSIS — Z7982 Long term (current) use of aspirin: Secondary | ICD-10-CM | POA: Diagnosis not present

## 2019-01-12 DIAGNOSIS — E039 Hypothyroidism, unspecified: Secondary | ICD-10-CM | POA: Diagnosis not present

## 2019-01-12 DIAGNOSIS — E785 Hyperlipidemia, unspecified: Secondary | ICD-10-CM | POA: Diagnosis not present

## 2019-01-13 ENCOUNTER — Ambulatory Visit: Payer: Medicare HMO | Attending: Family | Admitting: Physical Therapy

## 2019-01-13 ENCOUNTER — Other Ambulatory Visit: Payer: Self-pay

## 2019-01-13 DIAGNOSIS — M5412 Radiculopathy, cervical region: Secondary | ICD-10-CM

## 2019-01-13 DIAGNOSIS — M542 Cervicalgia: Secondary | ICD-10-CM | POA: Insufficient documentation

## 2019-01-13 NOTE — Therapy (Signed)
[facility] [place], [place], [address] Phone: [phone]   Fax:  [phone]  Physical Therapy Treatment  Patient Details  Name: Daniel Macias MRN: [id number] Date of Birth: 07/22/1950 Referring Provider (PT): Evelina Dun, FNP   Encounter Date: 01/13/2019  PT End of Session - 01/13/19 1028    Visit Number  9    Number of Visits  12    Date for PT Re-Evaluation  01/29/19    Authorization Type  FOTO; Progress note every 10th visit; KX modifier at 15th visit    PT Start Time  0900    PT Stop Time  0957    PT Time Calculation (min)  57 min    Activity Tolerance  Patient tolerated treatment well    Behavior During Therapy  [facility] for tasks assessed/performed       Past Medical History:  Diagnosis Date  . Allergy   . Carpal tunnel syndrome, bilateral   . Hx of colonic polyps   . Hyperlipidemia   . Hypertension   . Lower leg fracture   . Sleep apnea    CPAP  . Vitamin D deficiency     Past Surgical History:  Procedure Laterality Date  . CYST REMOVAL HAND Right    wrist   . LEG SURGERY Left 1971  . [place]   DDD    There were no vitals filed for this visit.  Subjective Assessment - 01/13/19 1025    Subjective  COVID-19 screen performed prior to patient entering clinic.  Pain low today.  Numbness in right hand during sleep.    Pertinent History  HTN, right knee meniscus repair 09/2018,    Limitations  Sitting;Reading;Lifting;House hold activities    Diagnostic tests  X-ray: mulitlevel degenerative changes, see media    Patient Stated Goals  decrease pain, stop numbness and tingling    Currently in Pain?  Yes    Pain Score  1     Pain Location  Neck    Pain Orientation  Right    Pain Descriptors / Indicators  Tightness    Pain Type  Chronic pain    Pain Onset  More than a month ago                       [facility] Adult PT Treatment/Exercise - 01/13/19 0001      Electrical  Stimulation   Electrical Stimulation Location  Right cervical.    Electrical Stimulation Action  Pre-mod.    Electrical Stimulation Parameters  80-150 Hz x 15 minutes (5 sec on and 5 sec off).    Electrical Stimulation Goals  Pain      Ultrasound   Ultrasound Location  RT cervical.    Ultrasound Parameters  Combo e'stim/U/S at 1.50 W/CM2 x 8 minutes.      Traction   Type of Traction  Cervical    Min (lbs)  5    Max (lbs)  25    Hold Time  99    Rest Time  5    Time  15                  PT Long Term Goals - 01/01/19 0936      PT LONG TERM GOAL #1   Title  Patient will be independent with HEP    Period  Weeks    Status  On-going      PT LONG TERM  GOAL #2   Title  Patient will demonstrate 55+ degrees of cervical rotation to improve ability to scan environment look over shoulder during driving    Time  6    Period  Weeks    Status  Achieved   MET 01/01/19     PT LONG TERM GOAL #3   Title  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    Time  6    Period  Weeks    Status  Achieved      PT LONG TERM GOAL #4   Title  Patient will report a decrease in neurological symptoms to bilateral right UEs to reduce nerve irritation    Time  6    Period  Weeks    Status  On-going      PT LONG TERM GOAL #5   Title  Patient will demonstrate 80 lbs of right grip force or equal to left to improve strength.    Time  6    Period  Weeks    Status  On-going            Plan - 01/13/19 1027    Clinical Impression Statement  Excellent job with a 3# increase of traction.  Pain at a low 1/10 prior to treatment and no complaint following treatment.    Personal Factors and Comorbidities  Age;Comorbidity 1    Examination-Activity Limitations  Lift    Examination-Participation Restrictions  Meal Prep;Yard Work    Stability/Clinical Decision Making  Stable/Uncomplicated    Rehab Potential  Good    PT Frequency  2x  / week    PT Duration  6 weeks    PT Treatment/Interventions  ADLs/Self Care Home Management;Cryotherapy;Electrical Stimulation;Moist Heat;Traction;Ultrasound;Therapeutic activities;Therapeutic exercise;Patient/family education;Dry needling;Passive range of motion;Manual techniques;Spinal Manipulations    PT Next Visit Plan  cont with POC for cervical ROM, ther ex and strengthening as tolerated. No right UE due to bicep tear. modalities    PT Home Exercise Plan  see patient education section    Consulted and Agree with Plan of Care  Patient       Patient will benefit from skilled therapeutic intervention in order to improve the following deficits and impairments:  Decreased activity tolerance, Decreased range of motion, Postural dysfunction, Pain, Decreased strength  Visit Diagnosis: Radiculopathy, cervical region  Cervicalgia     Problem List Patient Active Problem List   Diagnosis Date Noted  . Cervical spondylosis with radiculopathy 12/25/2018  . Biceps tendon rupture, right, initial encounter 12/25/2018  . Hypothyroidism (acquired) 12/01/2017  . Palpitation 09/30/2013  . Allergic rhinitis 05/27/2013  . Hypertension 05/27/2013  . Metabolic syndrome 99/37/1696  . BPH (benign prostatic hyperplasia) 11/25/2012  . Obstructive sleep apnea 11/25/2012  . Hyperlipemia 05/28/2012  . Vitamin D deficiency disease 05/28/2012    APPLEGATE, [place] MPT 01/13/2019, 10:29 AM  [facility] [address] [place], [place], [address] Phone: [phone]   Fax:  [phone]  Name: Daniel Macias MRN: [id number] Date of Birth: 07-27-1950

## 2019-01-15 ENCOUNTER — Encounter: Payer: Self-pay | Admitting: Physical Therapy

## 2019-01-15 ENCOUNTER — Other Ambulatory Visit: Payer: Self-pay

## 2019-01-15 ENCOUNTER — Ambulatory Visit: Payer: Medicare HMO | Admitting: Physical Therapy

## 2019-01-15 DIAGNOSIS — M542 Cervicalgia: Secondary | ICD-10-CM

## 2019-01-15 DIAGNOSIS — M5412 Radiculopathy, cervical region: Secondary | ICD-10-CM

## 2019-01-15 NOTE — Therapy (Signed)
[facility] [place], [place], [address] Phone: [phone]   Fax:  [phone]  Physical Therapy Treatment Progress Note Reporting Period 12/11/2018 to 01/15/2019  See note below for Objective Data and Assessment of Progress/Goals. Patient's goals are ongoing at this time and reports feeling about the same.      Patient Details  Name: Daniel Macias MRN: [id number] Date of Birth: 03/14/1950 Referring Provider (PT): Evelina Dun, FNP   Encounter Date: 01/15/2019  PT End of Session - 01/15/19 0948    Visit Number  10    Number of Visits  12    Date for PT Re-Evaluation  01/29/19    Authorization Type  FOTO; Progress note every 10th visit; KX modifier at 15th visit    PT Start Time  0908    PT Stop Time  0949    PT Time Calculation (min)  41 min    Activity Tolerance  Patient tolerated treatment well    Behavior During Therapy  [facility] for tasks assessed/performed       Past Medical History:  Diagnosis Date  . Allergy   . Carpal tunnel syndrome, bilateral   . Hx of colonic polyps   . Hyperlipidemia   . Hypertension   . Lower leg fracture   . Sleep apnea    CPAP  . Vitamin D deficiency     Past Surgical History:  Procedure Laterality Date  . CYST REMOVAL HAND Right    wrist   . LEG SURGERY Left 1971  . [place]   DDD    There were no vitals filed for this visit.  Subjective Assessment - 01/15/19 0911    Subjective  COVID-19 screen performed prior to patient entering clinic.  Patient reported about the same, had MRI and awaiting results    Pertinent History  HTN, right knee meniscus repair 09/2018,    Limitations  Sitting;Reading;Lifting;House hold activities    Diagnostic tests  X-ray: mulitlevel degenerative changes, see media    Patient Stated Goals  decrease pain, stop numbness and tingling    Currently in Pain?  Yes    Pain Score  1     Pain Location  Neck    Pain Orientation  Right     Pain Descriptors / Indicators  Tightness    Pain Type  Chronic pain    Pain Onset  More than a month ago    Pain Frequency  Intermittent    Aggravating Factors   certain movements and at night    Pain Relieving Factors  at rest                       [facility] Adult PT Treatment/Exercise - 01/15/19 0001      Electrical Stimulation   Electrical Stimulation Location  Right cervical.    Electrical Stimulation Action  premod    Electrical Stimulation Parameters  80-0150hz  x43min    Electrical Stimulation Goals  Pain      Ultrasound   Ultrasound Location  RT cervical paraspinals/UT    Ultrasound Parameters  combo US/ES 1.5w/cm2/100%/56mhz x55min    Ultrasound Goals  Pain      Traction   Type of Traction  Cervical    Min (lbs)  5    Max (lbs)  25    Hold Time  99    Rest Time  5    Time  15  PT Long Term Goals - 01/15/19 0935      PT LONG TERM GOAL #1   Title  Patient will be independent with HEP    Time  6    Period  Weeks    Status  On-going      PT LONG TERM GOAL #2   Title  Patient will demonstrate 55+ degrees of cervical rotation to improve ability to scan environment look over shoulder during driving    Time  6    Period  Weeks    Status  Achieved      PT LONG TERM GOAL #3   Title  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    Time  6    Period  Weeks    Status  Achieved      PT LONG TERM GOAL #4   Title  Patient will report a decrease in neurological symptoms to bilateral right UEs to reduce nerve irritation    Time  6    Period  Weeks    Status  On-going   continues to have symptoms at night 01/15/19     PT LONG TERM GOAL #5   Title  Patient will demonstrate 80 lbs of right grip force or equal to left to improve strength.    Time  6    Period  Weeks    Status  On-going   70# grip today 01/15/19           Plan - 01/15/19 0949    Clinical  Impression Statement  Patient tolerated treatment well today. Patient feels overall improvement and able to perform ADL's with greater ease. Patient continues to have grip strength limitations and UE symptoms at night. Patient remaining goals ongoing. Patient responded well to cervical traction today.    Personal Factors and Comorbidities  Age;Comorbidity 1    Examination-Activity Limitations  Lift    Examination-Participation Restrictions  Meal Prep;Yard Work    Stability/Clinical Decision Making  Stable/Uncomplicated    Rehab Potential  Good    PT Frequency  2x / week    PT Duration  6 weeks    PT Treatment/Interventions  ADLs/Self Care Home Management;Cryotherapy;Electrical Stimulation;Moist Heat;Traction;Ultrasound;Therapeutic activities;Therapeutic exercise;Patient/family education;Dry needling;Passive range of motion;Manual techniques;Spinal Manipulations    PT Next Visit Plan  cont with POC with 2 visits remaining, MRI pending    Consulted and Agree with Plan of Care  Patient       Patient will benefit from skilled therapeutic intervention in order to improve the following deficits and impairments:  Decreased activity tolerance, Decreased range of motion, Postural dysfunction, Pain, Decreased strength  Visit Diagnosis: Radiculopathy, cervical region  Cervicalgia     Problem List Patient Active Problem List   Diagnosis Date Noted  . Cervical spondylosis with radiculopathy 12/25/2018  . Biceps tendon rupture, right, initial encounter 12/25/2018  . Hypothyroidism (acquired) 12/01/2017  . Palpitation 09/30/2013  . Allergic rhinitis 05/27/2013  . Hypertension 05/27/2013  . Metabolic syndrome [id number]  . BPH (benign prostatic hyperplasia) 11/25/2012  . Obstructive sleep apnea 11/25/2012  . Hyperlipemia 05/28/2012  . Vitamin D deficiency disease 05/28/2012    Ladean Raya, PTA 01/15/19 10:00 AM  [facility] [place], [place], [address] Phone: [phone]   Fax:  [phone]  Name: Daniel Macias MRN: [id number] Date of Birth: 1950-04-08

## 2019-01-20 ENCOUNTER — Other Ambulatory Visit: Payer: Self-pay

## 2019-01-20 ENCOUNTER — Ambulatory Visit: Payer: Medicare HMO | Admitting: Physical Therapy

## 2019-01-20 ENCOUNTER — Encounter: Payer: Self-pay | Admitting: Physical Therapy

## 2019-01-20 DIAGNOSIS — M542 Cervicalgia: Secondary | ICD-10-CM | POA: Diagnosis not present

## 2019-01-20 DIAGNOSIS — M5412 Radiculopathy, cervical region: Secondary | ICD-10-CM

## 2019-01-20 NOTE — Therapy (Signed)
[facility] [place], [place], [address] Phone: [phone]   Fax:  [phone]  Physical Therapy Treatment  Patient Details  Name: Daniel Macias MRN: [id number] Date of Birth: [birth year] Referring Provider (PT): Evelina Dun, FNP   Encounter Date: 01/20/2019  PT End of Session - 01/20/19 1217    Visit Number  11    Number of Visits  12    Date for PT Re-Evaluation  01/29/19    Authorization Type  FOTO; Progress note every 10th visit; KX modifier at 15th visit    PT Start Time  0910    PT Stop Time  0955    PT Time Calculation (min)  45 min    Activity Tolerance  Patient tolerated treatment well    Behavior During Therapy  [facility] for tasks assessed/performed       Past Medical History:  Diagnosis Date  . Allergy   . Carpal tunnel syndrome, bilateral   . Hx of colonic polyps   . Hyperlipidemia   . Hypertension   . Lower leg fracture   . Sleep apnea    CPAP  . Vitamin D deficiency     Past Surgical History:  Procedure Laterality Date  . CYST REMOVAL HAND Right    wrist   . LEG SURGERY Left 1971  . [place]   DDD    There were no vitals filed for this visit.  Subjective Assessment - 01/20/19 1211    Subjective  COVID-19 screen performed prior to patient entering clinic.  Doing much better.  Pain at night into right arm.  We discussed, again, about changing pillows.    Pertinent History  HTN, right knee meniscus repair 09/2018,    Limitations  Sitting;Reading;Lifting;House hold activities    Diagnostic tests  X-ray: mulitlevel degenerative changes, see media    Patient Stated Goals  decrease pain, stop numbness and tingling    Currently in Pain?  Yes    Pain Score  1     Pain Location  Neck    Pain Orientation  Right    Pain Descriptors / Indicators  Tightness    Pain Type  Chronic pain    Pain Onset  More than a month ago                       [facility] Adult PT  Treatment/Exercise - 01/20/19 0001      Moist Heat Therapy   Number Minutes Moist Heat  10 Minutes    Moist Heat Location  Cervical      Electrical Stimulation   Electrical Stimulation Location  Affecte cervical.    Electrical Stimulation Action  Pre-mod.    Electrical Stimulation Parameters  80-150 Hz x 10 minutes.    Electrical Stimulation Goals  Pain      Manual Therapy   Manual Therapy  Soft tissue mobilization    Soft tissue mobilization  STW/M x 23 minutes to affected cervical musculature.                  PT Long Term Goals - 01/15/19 0935      PT LONG TERM GOAL #1   Title  Patient will be independent with HEP    Time  6    Period  Weeks    Status  On-going      PT LONG TERM GOAL #2   Title  Patient will demonstrate 55+ degrees of cervical  rotation to improve ability to scan environment look over shoulder during driving    Time  6    Period  Weeks    Status  Achieved      PT LONG TERM GOAL #3   Title  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    Time  6    Period  Weeks    Status  Achieved      PT LONG TERM GOAL #4   Title  Patient will report a decrease in neurological symptoms to bilateral right UEs to reduce nerve irritation    Time  6    Period  Weeks    Status  On-going   continues to have symptoms at night 01/15/19     PT LONG TERM GOAL #5   Title  Patient will demonstrate 80 lbs of right grip force or equal to left to improve strength.    Time  6    Period  Weeks    Status  On-going   70# grip today 01/15/19           Plan - 01/20/19 1217    Clinical Impression Statement  No pain after treatment.    Personal Factors and Comorbidities  Age;Comorbidity 1    Examination-Activity Limitations  Lift    Examination-Participation Restrictions  Meal Prep;Yard Work    Stability/Clinical Decision Making  Stable/Uncomplicated    Rehab Potential  Good    PT Frequency  2x /  week    PT Duration  6 weeks    PT Treatment/Interventions  ADLs/Self Care Home Management;Cryotherapy;Electrical Stimulation;Moist Heat;Traction;Ultrasound;Therapeutic activities;Therapeutic exercise;Patient/family education;Dry needling;Passive range of motion;Manual techniques;Spinal Manipulations    PT Next Visit Plan  One visit remaining.  D/C traction.    PT Home Exercise Plan  see patient education section    Consulted and Agree with Plan of Care  Patient       Patient will benefit from skilled therapeutic intervention in order to improve the following deficits and impairments:  Decreased activity tolerance, Decreased range of motion, Postural dysfunction, Pain, Decreased strength  Visit Diagnosis: Radiculopathy, cervical region  Cervicalgia     Problem List Patient Active Problem List   Diagnosis Date Noted  . Cervical spondylosis with radiculopathy 12/25/2018  . Biceps tendon rupture, right, initial encounter 12/25/2018  . Hypothyroidism (acquired) 12/01/2017  . Palpitation 09/30/2013  . Allergic rhinitis 05/27/2013  . Hypertension 05/27/2013  . Metabolic syndrome [id number]  . BPH (benign prostatic hyperplasia) 11/25/2012  . Obstructive sleep apnea 11/25/2012  . Hyperlipemia 05/28/2012  . Vitamin D deficiency disease 05/28/2012    Dolores Mcgovern, [place] MPT 01/20/2019, 12:19 PM  [facility] [address] [place], [place], [address] Phone: [phone]   Fax:  [phone]  Name: Daniel Macias MRN: [id number] Date of Birth: Jul 09, 1950

## 2019-01-22 ENCOUNTER — Other Ambulatory Visit: Payer: Self-pay

## 2019-01-22 ENCOUNTER — Ambulatory Visit: Payer: Medicare HMO | Admitting: Physical Therapy

## 2019-01-22 DIAGNOSIS — M5412 Radiculopathy, cervical region: Secondary | ICD-10-CM

## 2019-01-22 DIAGNOSIS — M542 Cervicalgia: Secondary | ICD-10-CM

## 2019-01-22 NOTE — Therapy (Addendum)
[facility] [place], [place], [address] Phone: [phone]   Fax:  [phone]  Physical Therapy Treatment  Patient Details  Name: Daniel Macias MRN: [id number] Date of Birth: [birth year] Referring Provider (PT): Evelina Dun, FNP   Encounter Date: 01/22/2019  PT End of Session - 01/22/19 1246    Visit Number  12    Number of Visits  12    Date for PT Re-Evaluation  01/29/19    Authorization Type  FOTO; Progress note every 10th visit; KX modifier at 15th visit    PT Start Time  0917   Late for treatment.   PT Stop Time  0953    PT Time Calculation (min)  36 min    Activity Tolerance  Patient tolerated treatment well    Behavior During Therapy  WFL for tasks assessed/performed       Past Medical History:  Diagnosis Date  . Allergy   . Carpal tunnel syndrome, bilateral   . Hx of colonic polyps   . Hyperlipidemia   . Hypertension   . Lower leg fracture   . Sleep apnea    CPAP  . Vitamin D deficiency     Past Surgical History:  Procedure Laterality Date  . CYST REMOVAL HAND Right    wrist   . LEG SURGERY Left 1971  . [place]   DDD    There were no vitals filed for this visit.  Subjective Assessment - 01/22/19 1249    Subjective  COVID-19 screen performed prior to patient entering clinic.  See "Therapy Note" section.  Patient wonders if the strap from his CPAP may be causing some of his nightly symptoms.  He is very pleased and has a low pain-level even after doing some heavy work at a property of his sons.    Pertinent History  HTN, right knee meniscus repair 09/2018,                       [facility] Adult PT Treatment/Exercise - 01/22/19 0001      Electrical Stimulation   Electrical Stimulation Location  Affected cervical    Electrical Stimulation Action  Pre-mod.    Electrical Stimulation Parameters  80-150 Hz x 20 minutes.    Electrical Stimulation Goals  Pain      Manual  Therapy   Manual Therapy  Soft tissue mobilization    Soft tissue mobilization  STW/M x 10 minutes to decrease cervical musculature tone.                  PT Long Term Goals - 01/22/19 1247      PT LONG TERM GOAL #1   Title  Patient will be independent with HEP    Time  6    Period  Weeks    Status  Achieved      PT LONG TERM GOAL #2   Title  Patient will demonstrate 55+ degrees of cervical rotation to improve ability to scan environment look over shoulder during driving    Baseline  LT= 65 degrees and RT=75 degrees.    Time  6    Period  Weeks    Status  Achieved      PT LONG TERM GOAL #3   Title  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    Time  6    Period  Weeks  Status  Achieved      PT LONG TERM GOAL #4   Title  Patient will report a decrease in neurological symptoms to bilateral right UEs to reduce nerve irritation    Baseline  Numbness at night. into right UE.    Time  6    Period  Weeks    Status  Partially Met      PT LONG TERM GOAL #5   Title  Patient will demonstrate 80 lbs of right grip force or equal to left to improve strength.    Baseline  75#.    Time  6    Period  Weeks    Status  Partially Met            Plan - 01/22/19 1301    Clinical Impression Statement  See "Therapy Note" section.    Personal Factors and Comorbidities  Age;Comorbidity 1    Examination-Activity Limitations  Lift    Examination-Participation Restrictions  Meal Prep;Yard Work    Rehab Potential  Good    PT Frequency  2x / week    PT Duration  6 weeks    PT Treatment/Interventions  ADLs/Self Care Home Management;Cryotherapy;Electrical Stimulation;Moist Heat;Traction;Ultrasound;Therapeutic activities;Therapeutic exercise;Patient/family education;Dry needling;Passive range of motion;Manual techniques;Spinal Manipulations    PT Next Visit Plan  One visit remaining.  D/C traction.    PT Home  Exercise Plan  see patient education section    Consulted and Agree with Plan of Care  Patient       Patient will benefit from skilled therapeutic intervention in order to improve the following deficits and impairments:  Decreased activity tolerance, Decreased range of motion, Postural dysfunction, Pain, Decreased strength  Visit Diagnosis: Radiculopathy, cervical region  Cervicalgia     Problem List Patient Active Problem List   Diagnosis Date Noted  . Cervical spondylosis with radiculopathy 12/25/2018  . Biceps tendon rupture, right, initial encounter 12/25/2018  . Hypothyroidism (acquired) 12/01/2017  . Palpitation 09/30/2013  . Allergic rhinitis 05/27/2013  . Hypertension 05/27/2013  . Metabolic syndrome 53/66/4403  . BPH (benign prostatic hyperplasia) 11/25/2012  . Obstructive sleep apnea 11/25/2012  . Hyperlipemia 05/28/2012  . Vitamin D deficiency disease 05/28/2012    Junelle Hashemi, [place] MPT 01/22/2019, 1:03 PM  [facility] [address] [place], [place], [address] Phone: [phone]   Fax:  [phone]  Name: Daniel Macias MRN: [id number] Date of Birth: 1950-05-12

## 2019-01-23 DIAGNOSIS — M5030 Other cervical disc degeneration, unspecified cervical region: Secondary | ICD-10-CM | POA: Diagnosis not present

## 2019-01-30 ENCOUNTER — Other Ambulatory Visit: Payer: Self-pay | Admitting: Family Medicine

## 2019-01-30 DIAGNOSIS — M545 Low back pain, unspecified: Secondary | ICD-10-CM

## 2019-01-30 DIAGNOSIS — M4722 Other spondylosis with radiculopathy, cervical region: Secondary | ICD-10-CM

## 2019-02-18 ENCOUNTER — Other Ambulatory Visit: Payer: Self-pay | Admitting: Family Medicine

## 2019-03-05 ENCOUNTER — Other Ambulatory Visit: Payer: Self-pay | Admitting: Family Medicine

## 2019-03-05 DIAGNOSIS — M545 Low back pain, unspecified: Secondary | ICD-10-CM

## 2019-03-05 DIAGNOSIS — M4722 Other spondylosis with radiculopathy, cervical region: Secondary | ICD-10-CM

## 2019-03-05 NOTE — Telephone Encounter (Signed)
Last seen 12/15/2018 Last cmp 12/15/2018

## 2019-03-12 ENCOUNTER — Telehealth: Payer: Self-pay | Admitting: *Deleted

## 2019-03-12 ENCOUNTER — Other Ambulatory Visit: Payer: Self-pay | Admitting: Physician Assistant

## 2019-03-12 MED ORDER — VITAMIN D (ERGOCALCIFEROL) 1.25 MG (50000 UNIT) PO CAPS: ORAL_CAPSULE | ORAL | 1 refills | Status: DC

## 2019-03-12 NOTE — Telephone Encounter (Signed)
His insurance declined?  If so he can get vitamin D3 2000 international units 1 daily.

## 2019-03-12 NOTE — Telephone Encounter (Signed)
It was denied here, not insurance. I think because it was in [place]'s name

## 2019-03-12 NOTE — Telephone Encounter (Signed)
I sent a prescription for 6 months.

## 2019-03-12 NOTE — Telephone Encounter (Signed)
Pt says his Vit D was denied. Does he need prescription or OTC and how much?

## 2019-03-12 NOTE — Telephone Encounter (Signed)
Pt aware.

## 2019-03-28 ENCOUNTER — Other Ambulatory Visit: Payer: Self-pay | Admitting: Family Medicine

## 2019-03-28 DIAGNOSIS — M545 Low back pain, unspecified: Secondary | ICD-10-CM

## 2019-03-28 DIAGNOSIS — M4722 Other spondylosis with radiculopathy, cervical region: Secondary | ICD-10-CM

## 2019-04-05 ENCOUNTER — Ambulatory Visit: Payer: Medicare HMO | Attending: Internal Medicine

## 2019-04-05 DIAGNOSIS — Z23 Encounter for immunization: Secondary | ICD-10-CM | POA: Insufficient documentation

## 2019-04-05 NOTE — Progress Notes (Signed)
   Covid-19 Vaccination Clinic  Name:  WOOD AXELSON    MRN: [id number] DOB: 17-Aug-1950  04/05/2019  Mr. Zinger was observed post Covid-19 immunization for 15 minutes without incidence. He was provided with Vaccine Information Sheet and instruction to access the V-Safe system.   Mr. Blakeman was instructed to call 911 with any severe reactions post vaccine: Marland Kitchen Difficulty breathing  . Swelling of your face and throat  . A fast heartbeat  . A bad rash all over your body  . Dizziness and weakness    Immunizations Administered    Name Date Dose VIS Date Route   Pfizer COVID-19 Vaccine 04/05/2019  2:10 PM 0.3 mL 01/23/2019 Intramuscular   Manufacturer: [place]   Lot: [id number]   [place]: [id number]

## 2019-04-29 ENCOUNTER — Ambulatory Visit: Payer: Medicare HMO | Attending: Internal Medicine

## 2019-04-29 DIAGNOSIS — Z23 Encounter for immunization: Secondary | ICD-10-CM

## 2019-04-29 NOTE — Progress Notes (Signed)
   Covid-19 Vaccination Clinic  Name:  Daniel Macias    MRN: [id number] DOB: 1950-04-10  04/29/2019  Mr. Hyers was observed post Covid-19 immunization for 15 minutes without incident. He was provided with Vaccine Information Sheet and instruction to access the V-Safe system.   Mr. Velde was instructed to call 911 with any severe reactions post vaccine: Marland Kitchen Difficulty breathing  . Swelling of face and throat  . A fast heartbeat  . A bad rash all over body  . Dizziness and weakness   Immunizations Administered    Name Date Dose VIS Date Route   Pfizer COVID-19 Vaccine 04/29/2019  1:58 PM 0.3 mL 01/23/2019 Intramuscular   Manufacturer: [place]   Lot: [id number]   [place]: [id number]

## 2019-05-24 ENCOUNTER — Other Ambulatory Visit: Payer: Self-pay | Admitting: Family Medicine

## 2019-06-23 DIAGNOSIS — E785 Hyperlipidemia, unspecified: Secondary | ICD-10-CM | POA: Diagnosis not present

## 2019-06-23 DIAGNOSIS — Z7982 Long term (current) use of aspirin: Secondary | ICD-10-CM | POA: Diagnosis not present

## 2019-06-23 DIAGNOSIS — G4730 Sleep apnea, unspecified: Secondary | ICD-10-CM | POA: Diagnosis not present

## 2019-06-23 DIAGNOSIS — Z885 Allergy status to narcotic agent status: Secondary | ICD-10-CM | POA: Diagnosis not present

## 2019-06-23 DIAGNOSIS — Z008 Encounter for other general examination: Secondary | ICD-10-CM | POA: Diagnosis not present

## 2019-06-23 DIAGNOSIS — E669 Obesity, unspecified: Secondary | ICD-10-CM | POA: Diagnosis not present

## 2019-06-23 DIAGNOSIS — I10 Essential (primary) hypertension: Secondary | ICD-10-CM | POA: Diagnosis not present

## 2019-07-09 ENCOUNTER — Ambulatory Visit (INDEPENDENT_AMBULATORY_CARE_PROVIDER_SITE_OTHER): Payer: Medicare HMO | Admitting: Family Medicine

## 2019-07-09 ENCOUNTER — Other Ambulatory Visit: Payer: Self-pay

## 2019-07-09 ENCOUNTER — Encounter: Payer: Self-pay | Admitting: Family Medicine

## 2019-07-09 VITALS — BP 108/69 | HR 82 | Temp 97.4°F | Ht 67.0 in | Wt 199.0 lb

## 2019-07-09 DIAGNOSIS — R739 Hyperglycemia, unspecified: Secondary | ICD-10-CM

## 2019-07-09 DIAGNOSIS — I951 Orthostatic hypotension: Secondary | ICD-10-CM | POA: Diagnosis not present

## 2019-07-09 DIAGNOSIS — E039 Hypothyroidism, unspecified: Secondary | ICD-10-CM | POA: Diagnosis not present

## 2019-07-09 DIAGNOSIS — Z0001 Encounter for general adult medical examination with abnormal findings: Secondary | ICD-10-CM

## 2019-07-09 DIAGNOSIS — E782 Mixed hyperlipidemia: Secondary | ICD-10-CM | POA: Diagnosis not present

## 2019-07-09 DIAGNOSIS — Z0000 Encounter for general adult medical examination without abnormal findings: Secondary | ICD-10-CM

## 2019-07-09 LAB — BAYER DCA HB A1C WAIVED: HB A1C (BAYER DCA - WAIVED): 6 % (ref <7.0)

## 2019-07-09 NOTE — Progress Notes (Signed)
Daniel Macias is a 69 y.o. male presents to office today for annual physical exam examination.    Concerns today include: 1.  Hypertension Patient reports that he did become orthostatic recently when he bent over.  He has been really working hard on weight loss and diet modification.  Blood pressures have been on the lower side.  He continues to take his blood pressure medications as prescribed.  Is not reporting chest pain, loss of consciousness.  He reports family history significant for myocardial infarction in both his father in his 54s or 69s and his brother in his 15s.  Both were smokers.  Patient has never used tobacco.  2.  Hypothyroidism, acquired Patient reports no history of radiation or surgery to the neck.  No known family history of thyroid disorder.  He reports compliance with Synthroid 25 mcg daily.  Is not reporting heart palpitations, tremor, change in bowel habit.  He has had intentional weight loss.  Marital status: Married, Substance use: none Diet: balanced, Exercise: regular Last eye exam: UTD Last dental exam: UTD   Past Medical History:  Diagnosis Date  . Allergy   . Carpal tunnel syndrome, bilateral   . Hx of colonic polyps   . Hyperlipidemia   . Hypertension   . Lower leg fracture   . Sleep apnea    CPAP  . Vitamin D deficiency    Social History   Socioeconomic History  . Marital status: Married    Spouse name: linda  . Number of children: 2  . Years of education: Not on file  . Highest education level: Not on file  Occupational History  . Occupation: retired     Comment: Architect   Tobacco Use  . Smoking status: Never Smoker  . Smokeless tobacco: Never Used  Substance and Sexual Activity  . Alcohol use: Yes    Comment: occasional  . Drug use: No  . Sexual activity: Yes  Other Topics Concern  . 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    Social Determinants of Health   Financial Resource Strain:   .  Difficulty of Paying Living Expenses:   Food Insecurity:   . Worried About Charity fundraiser in the Last Year:   . Arboriculturist in the Last Year:   Transportation Needs:   . Film/video editor (Medical):   Marland Kitchen Lack of Transportation (Non-Medical):   Physical Activity:   . Days of Exercise per Week:   . Minutes of Exercise per Session:   Stress:   . Feeling of Stress :   Social Connections:   . Frequency of Communication with Friends and Family:   . Frequency of Social Gatherings with Friends and Family:   . Attends Religious Services:   . Active Member of Clubs or Organizations:   . Attends Archivist Meetings:   Marland Kitchen Marital Status:   Intimate Partner Violence:   . Fear of Current or Ex-Partner:   . Emotionally Abused:   Marland Kitchen Physically Abused:   . Sexually Abused:    Past Surgical History:  Procedure Laterality Date  . CYST REMOVAL HAND Right    wrist   . LEG SURGERY Left 1971  . SPINE SURGERY  1999   DDD   Family History  Problem Relation Age of Onset  . Hypertension Mother   . Cancer Mother        uterine -METS to lungs  . Diabetes  Mother   . Emphysema Father   . Heart disease Father   . Heart attack Brother 57       x 2   . Heart disease Brother   . Hypertension Brother   . Diabetes Brother   . Crohn's disease Son   . Heart attack Paternal Grandfather     Current Outpatient Medications:  .  aspirin EC 81 MG tablet, Take 81 mg by mouth daily., Disp: , Rfl:  .  azelastine (ASTELIN) 0.1 % nasal spray, Place 2 sprays into both nostrils at bedtime. Use in each nostril as directed, Disp: 30 mL, Rfl: 2 .  diclofenac (VOLTAREN) 75 MG EC tablet, TAKE 1 TABLET (75 MG TOTAL) BY MOUTH 2 (TWO) TIMES DAILY. (PRN BACK/ NECK PAIN), Disp: 60 tablet, Rfl: 2 .  fluticasone (FLONASE) 50 MCG/ACT nasal spray, PLACE 2 SPRAYS INTO BOTH NOSTRILS DAILY AS NEEDED., Disp: 48 mL, Rfl: 1 .  levothyroxine (SYNTHROID) 25 MCG tablet, Take 1 tablet (25 mcg total) by mouth daily  before breakfast., Disp: 90 tablet, Rfl: 3 .  rosuvastatin (CRESTOR) 10 MG tablet, TAKE 1 TABLET BY MOUTH EVERY DAY, Disp: 90 tablet, Rfl: 0 .  valsartan (DIOVAN) 160 MG tablet, Take 1 tablet (160 mg total) by mouth daily., Disp: 90 tablet, Rfl: 1 .  Vitamin D, Ergocalciferol, (DRISDOL) 1.25 MG (50000 UNIT) CAPS capsule, TAKE 1 CAPSULE EVERY 7 DAYS, Disp: 12 capsule, Rfl: 1  Allergies  Allergen Reactions  . Morphine And Related Itching  . Lisinopril Cough     ROS: Review of Systems A comprehensive review of systems was negative except for: Musculoskeletal: positive for Ankle pain, left.  Has appointment with orthopedics on 10 June. As per HPI    Physical exam BP 108/69   Pulse 82   Temp (!) 97.4 F (36.3 C)   Ht '5\' 7"'  (1.702 m)   Wt 199 lb (90.3 kg)   SpO2 96%   BMI 31.17 kg/m  General appearance: alert, cooperative, appears stated age and no distress Head: Normocephalic, without obvious abnormality, atraumatic Eyes: negative findings: lids and lashes normal, conjunctivae and sclerae normal, corneas clear and pupils equal, round, reactive to light and accomodation Ears: normal TM's and external ear canals both ears Nose: Nares normal. Septum midline. Mucosa normal. No drainage or sinus tenderness. Throat: lips, mucosa, and tongue normal; teeth and gums normal Neck: no adenopathy, supple, symmetrical, trachea midline and thyroid not enlarged, symmetric, no tenderness/mass/nodules Back: symmetric, no curvature. ROM normal. No CVA tenderness. Lungs: clear to auscultation bilaterally Chest wall: no tenderness Heart: regular rate and rhythm, S1, S2 normal, no murmur, click, rub or gallop Abdomen: soft, non-tender; bowel sounds normal; no masses,  no organomegaly Extremities: extremities normal, atraumatic, no cyanosis or edema Pulses: 2+ and symmetric Skin: Skin color, texture, turgor normal. No rashes or lesions Lymph nodes: Cervical, supraclavicular, and axillary nodes normal.  Neurologic: Alert and oriented X 3, normal strength and tone. Normal symmetric reflexes. Normal coordination and gait Psych: Mood stable, speech normal, affect appropriate, pleasant and interactive  Depression screen [facility] 2/9 07/09/2019 12/25/2018 11/11/2018  Decreased Interest 0 0 0  Down, Depressed, Hopeless 0 0 0  PHQ - 2 Score 0 0 0  Altered sleeping - 0 -  Tired, decreased energy - 0 -  Change in appetite - 0 -  Feeling bad or failure about yourself  - 0 -  Trouble concentrating - 0 -  Moving slowly or fidgety/restless - 0 -  Suicidal thoughts -  0 -  PHQ-9 Score - 0 -    Assessment/ Plan: [name] here for annual physical exam.   1. Annual physical exam  2. Elevated serum glucose Continues to be in prediabetic range but is doing a good job with weight loss with diet modification.  He has met his goal of being less than 200 pounds and continues to strive for ongoing weight loss. Lab Results  Component Value Date   HGBA1C 6.0 07/09/2019  - Bayer DCA Hb A1c Waived  3. Acquired hypothyroidism Asymptomatic.  Check levels - Thyroid Panel With TSH  4. Mixed hyperlipidemia Check fasting lipid panel - Lipid Panel  5. Orthostatic hypotension Stop valsartan.  He is to monitor his blood pressure daily for the next 2 weeks.  He will record.  If blood pressures remain below 140/90, he can stay off of medicine.  However, if blood pressures go above 140/90 he is to start his blood pressure medication at half tablet, this would be 80 mg daily.  Additionally, okay to discontinue prescription vitamin D and use OTC vitamin D 800 international units daily.  I also gave him a handout on recommended calcium and vitamin D amounts per day.  I gave him a handout with dietary sources of this and encouraged him to calculate what his needs are.   Counseled on healthy lifestyle choices, including diet (rich in fruits, vegetables and lean meats and low in salt and simple carbohydrates) and  exercise (at least 30 minutes of moderate physical activity daily).  Patient to follow up in 1 year for annual exam or sooner if needed.  Ashly M. Lajuana Ripple, DO

## 2019-07-09 NOTE — Patient Instructions (Signed)
STOP Valsartan.  Check BP daily around the same time every day.  Record Call in 2 weeks with records If you stay below 140/90, no need to resume BP med.  If you go above 140/90, please start back at 1/2 tablet daily.   Preventive Care 50 Years and Older, Male Preventive care refers to lifestyle choices and visits with your health care provider that can promote health and wellness. This includes:  A yearly physical exam. This is also called an annual well check.  Regular dental and eye exams.  Immunizations.  Screening for certain conditions.  Healthy lifestyle choices, such as diet and exercise. What can I expect for my preventive care visit? Physical exam Your health care provider will check:  Height and weight. These may be used to calculate body mass index (BMI), which is a measurement that tells if you are at a healthy weight.  Heart rate and blood pressure.  Your skin for abnormal spots. Counseling Your health care provider may ask you questions about:  Alcohol, tobacco, and drug use.  Emotional well-being.  Home and relationship well-being.  Sexual activity.  Eating habits.  History of falls.  Memory and ability to understand (cognition).  Work and work Statistician. What immunizations do I need?  Influenza (flu) vaccine  This is recommended every year. Tetanus, diphtheria, and pertussis (Tdap) vaccine  You may need a Td booster every 10 years. Varicella (chickenpox) vaccine  You may need this vaccine if you have not already been vaccinated. Zoster (shingles) vaccine  You may need this after age 84. Pneumococcal conjugate (PCV13) vaccine  One dose is recommended after age 62. Pneumococcal polysaccharide (PPSV23) vaccine  One dose is recommended after age 15. Measles, mumps, and rubella (MMR) vaccine  You may need at least one dose of MMR if you were born in 1957 or later. You may also need a second dose. Meningococcal conjugate (MenACWY)  vaccine  You may need this if you have certain conditions. Hepatitis A vaccine  You may need this if you have certain conditions or if you travel or work in places where you may be exposed to hepatitis A. Hepatitis B vaccine  You may need this if you have certain conditions or if you travel or work in places where you may be exposed to hepatitis B. Haemophilus influenzae type b (Hib) vaccine  You may need this if you have certain conditions. You may receive vaccines as individual doses or as more than one vaccine together in one shot (combination vaccines). Talk with your health care provider about the risks and benefits of combination vaccines. What tests do I need? Blood tests  Lipid and cholesterol levels. These may be checked every 5 years, or more frequently depending on your overall health.  Hepatitis C test.  Hepatitis B test. Screening  Lung cancer screening. You may have this screening every year starting at age 39 if you have a 30-pack-year history of smoking and currently smoke or have quit within the past 15 years.  Colorectal cancer screening. All adults should have this screening starting at age 26 and continuing until age 72. Your health care provider may recommend screening at age 74 if you are at increased risk. You will have tests every 1-10 years, depending on your results and the type of screening test.  Prostate cancer screening. Recommendations will vary depending on your family history and other risks.  Diabetes screening. This is done by checking your blood sugar (glucose) after you have not eaten  for a while (fasting). You may have this done every 1-3 years.  Abdominal aortic aneurysm (AAA) screening. You may need this if you are a current or former smoker.  Sexually transmitted disease (STD) testing. Follow these instructions at home: Eating and drinking  Eat a diet that includes fresh fruits and vegetables, whole grains, lean protein, and low-fat dairy  products. Limit your intake of foods with high amounts of sugar, saturated fats, and salt.  Take vitamin and mineral supplements as recommended by your health care provider.  Do not drink alcohol if your health care provider tells you not to drink.  If you drink alcohol: ? Limit how much you have to 0-2 drinks a day. ? Be aware of how much alcohol is in your drink. In the [place] one drink equals one 12 oz bottle of beer (355 mL), one 5 oz glass of wine (148 mL), or one 1 oz glass of hard liquor (44 mL). Lifestyle  Take daily care of your teeth and gums.  Stay active. Exercise for at least 30 minutes on 5 or more days each week.  Do not use any products that contain nicotine or tobacco, such as cigarettes, e-cigarettes, and chewing tobacco. If you need help quitting, ask your health care provider.  If you are sexually active, practice safe sex. Use a condom or other form of protection to prevent STIs (sexually transmitted infections).  Talk with your health care provider about taking a low-dose aspirin or statin. What's next?  Visit your health care provider once a year for a well check visit.  Ask your health care provider how often you should have your eyes and teeth checked.  Stay up to date on all vaccines. This information is not intended to replace advice given to you by your health care provider. Make sure you discuss any questions you have with your health care provider. Document Revised: 01/23/2018 Document Reviewed: 01/23/2018 Elsevier Patient Education  2020 [employer].

## 2019-07-10 LAB — LIPID PANEL
Chol/HDL Ratio: 2.4 ratio (ref 0.0–5.0)
Cholesterol, Total: 118 mg/dL (ref 100–199)
HDL: 49 mg/dL (ref >39)
LDL Chol Calc (NIH): 59 mg/dL (ref 0–99)
Triglycerides: 41 mg/dL (ref 0–149)
VLDL Cholesterol Cal: 10 mg/dL (ref 5–40)

## 2019-07-10 LAB — THYROID PANEL WITH TSH
Free Thyroxine Index: 1.6 (ref 1.2–4.9)
T3 Uptake Ratio: 25 % (ref 24–39)
T4, Total: 6.2 ug/dL (ref 4.5–12.0)
TSH: 2.55 u[IU]/mL (ref 0.450–4.500)

## 2019-07-12 ENCOUNTER — Other Ambulatory Visit: Payer: Self-pay | Admitting: Family Medicine

## 2019-07-14 NOTE — Telephone Encounter (Signed)
Per Dr. Lajuana Ripple last clinic note pt was to hole me for 2 weeks.  If BP <140/90 then he should start back on 80mg  daily.   Left message for pt to return call. How has he been feeling. How has his BP been?

## 2019-07-23 DIAGNOSIS — G8929 Other chronic pain: Secondary | ICD-10-CM | POA: Diagnosis not present

## 2019-07-23 DIAGNOSIS — M25572 Pain in left ankle and joints of left foot: Secondary | ICD-10-CM | POA: Diagnosis not present

## 2019-07-23 DIAGNOSIS — M19079 Primary osteoarthritis, unspecified ankle and foot: Secondary | ICD-10-CM | POA: Diagnosis not present

## 2019-07-23 DIAGNOSIS — M19072 Primary osteoarthritis, left ankle and foot: Secondary | ICD-10-CM | POA: Diagnosis not present

## 2019-07-24 DIAGNOSIS — G8929 Other chronic pain: Secondary | ICD-10-CM | POA: Insufficient documentation

## 2019-07-24 DIAGNOSIS — M6702 Short Achilles tendon (acquired), left ankle: Secondary | ICD-10-CM | POA: Insufficient documentation

## 2019-07-24 DIAGNOSIS — M19072 Primary osteoarthritis, left ankle and foot: Secondary | ICD-10-CM | POA: Insufficient documentation

## 2019-07-29 DIAGNOSIS — M19072 Primary osteoarthritis, left ankle and foot: Secondary | ICD-10-CM | POA: Diagnosis not present

## 2019-07-29 DIAGNOSIS — M19079 Primary osteoarthritis, unspecified ankle and foot: Secondary | ICD-10-CM | POA: Diagnosis not present

## 2019-07-29 DIAGNOSIS — G8929 Other chronic pain: Secondary | ICD-10-CM | POA: Diagnosis not present

## 2019-07-29 DIAGNOSIS — M25572 Pain in left ankle and joints of left foot: Secondary | ICD-10-CM | POA: Diagnosis not present

## 2019-07-29 DIAGNOSIS — M1712 Unilateral primary osteoarthritis, left knee: Secondary | ICD-10-CM | POA: Diagnosis not present

## 2019-08-18 ENCOUNTER — Other Ambulatory Visit: Payer: Self-pay | Admitting: Family Medicine

## 2019-09-10 DIAGNOSIS — G4733 Obstructive sleep apnea (adult) (pediatric): Secondary | ICD-10-CM | POA: Diagnosis not present

## 2019-10-11 DIAGNOSIS — G4733 Obstructive sleep apnea (adult) (pediatric): Secondary | ICD-10-CM | POA: Diagnosis not present

## 2019-10-27 DIAGNOSIS — K648 Other hemorrhoids: Secondary | ICD-10-CM | POA: Diagnosis not present

## 2019-10-27 DIAGNOSIS — Z1211 Encounter for screening for malignant neoplasm of colon: Secondary | ICD-10-CM | POA: Diagnosis not present

## 2019-10-27 DIAGNOSIS — Z86010 Personal history of colonic polyps: Secondary | ICD-10-CM | POA: Diagnosis not present

## 2019-11-04 DIAGNOSIS — L570 Actinic keratosis: Secondary | ICD-10-CM | POA: Diagnosis not present

## 2019-11-04 DIAGNOSIS — Z85828 Personal history of other malignant neoplasm of skin: Secondary | ICD-10-CM | POA: Diagnosis not present

## 2019-11-04 DIAGNOSIS — L738 Other specified follicular disorders: Secondary | ICD-10-CM | POA: Diagnosis not present

## 2019-11-04 DIAGNOSIS — D1801 Hemangioma of skin and subcutaneous tissue: Secondary | ICD-10-CM | POA: Diagnosis not present

## 2019-11-04 DIAGNOSIS — L814 Other melanin hyperpigmentation: Secondary | ICD-10-CM | POA: Diagnosis not present

## 2019-11-04 DIAGNOSIS — L821 Other seborrheic keratosis: Secondary | ICD-10-CM | POA: Diagnosis not present

## 2019-11-11 DIAGNOSIS — G4733 Obstructive sleep apnea (adult) (pediatric): Secondary | ICD-10-CM | POA: Diagnosis not present

## 2019-12-21 DIAGNOSIS — Z86010 Personal history of colonic polyps: Secondary | ICD-10-CM | POA: Diagnosis not present

## 2019-12-21 DIAGNOSIS — Z1211 Encounter for screening for malignant neoplasm of colon: Secondary | ICD-10-CM | POA: Diagnosis not present

## 2020-02-08 IMAGING — DX DG CHEST 2V
2 series · 2 of 2 positions shown · non-contrast
Comparison: Chest x-ray of February 23, 2015

CLINICAL DATA: Hypertension, hypercholesterolemia, nonsmoker.

EXAM:
CHEST - 2 VIEW

[chest pa]
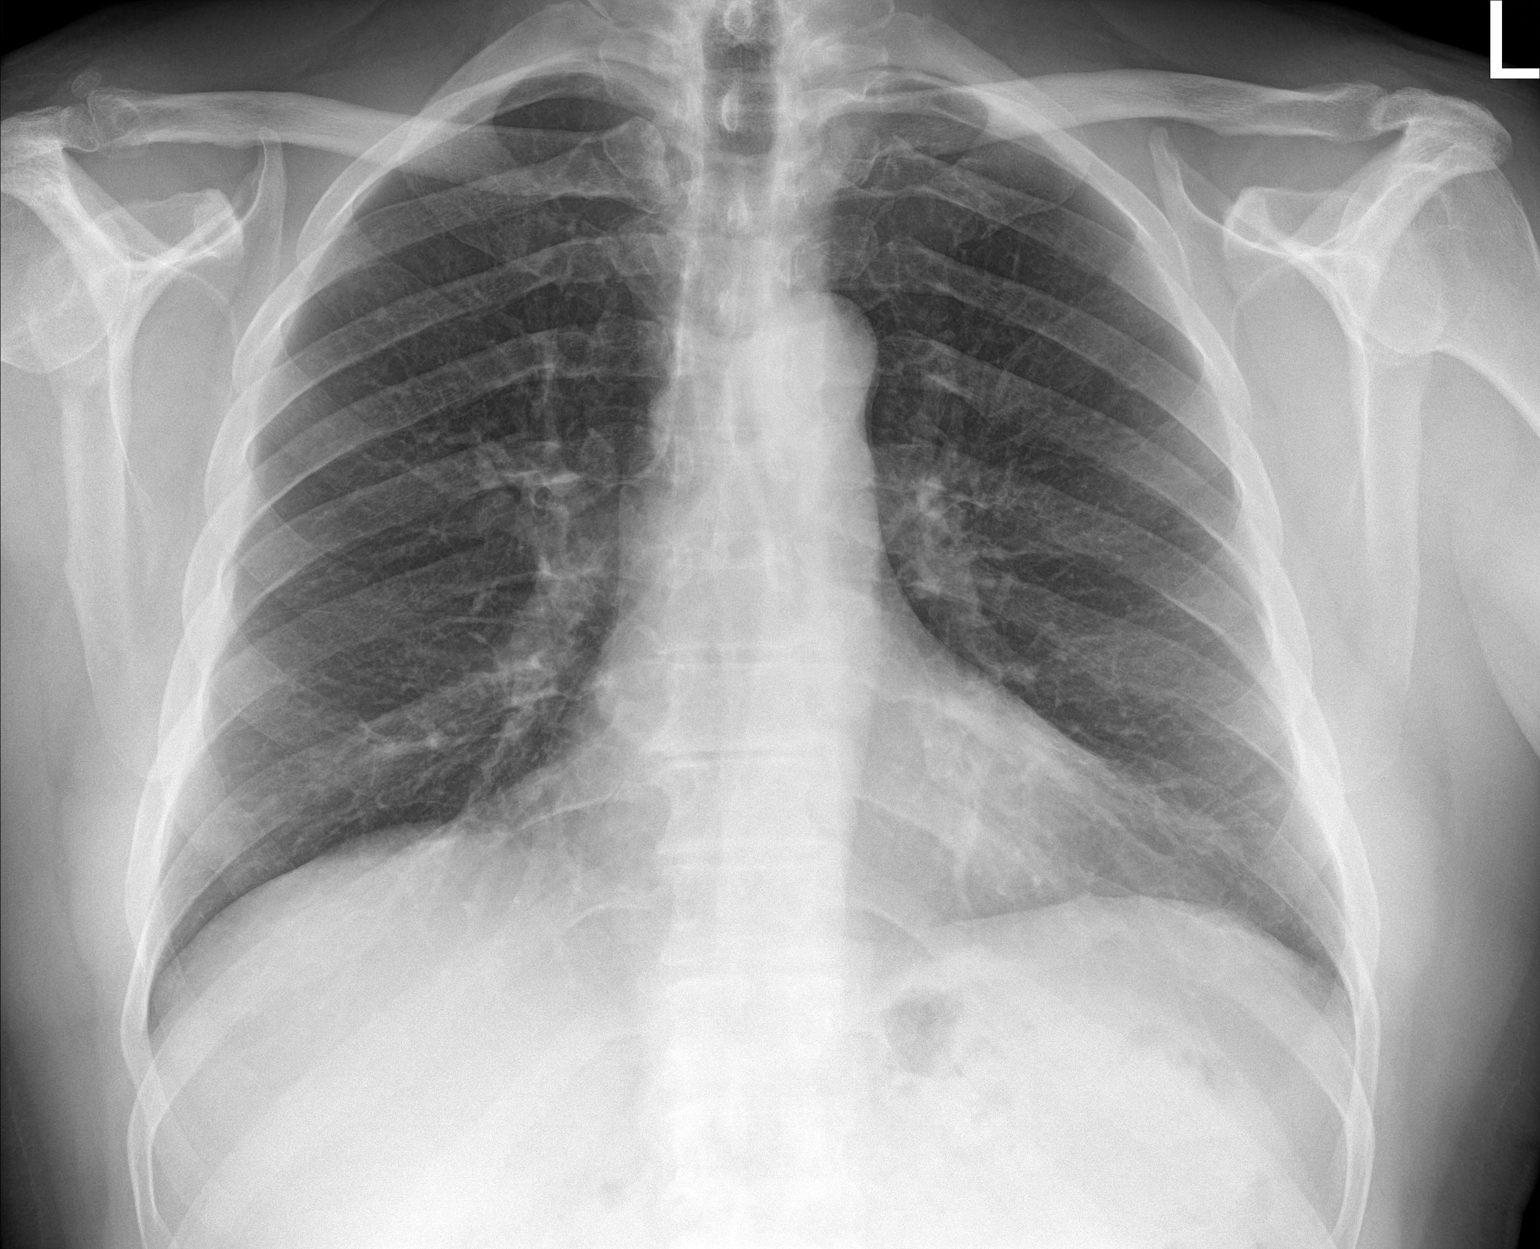

[chest lat]
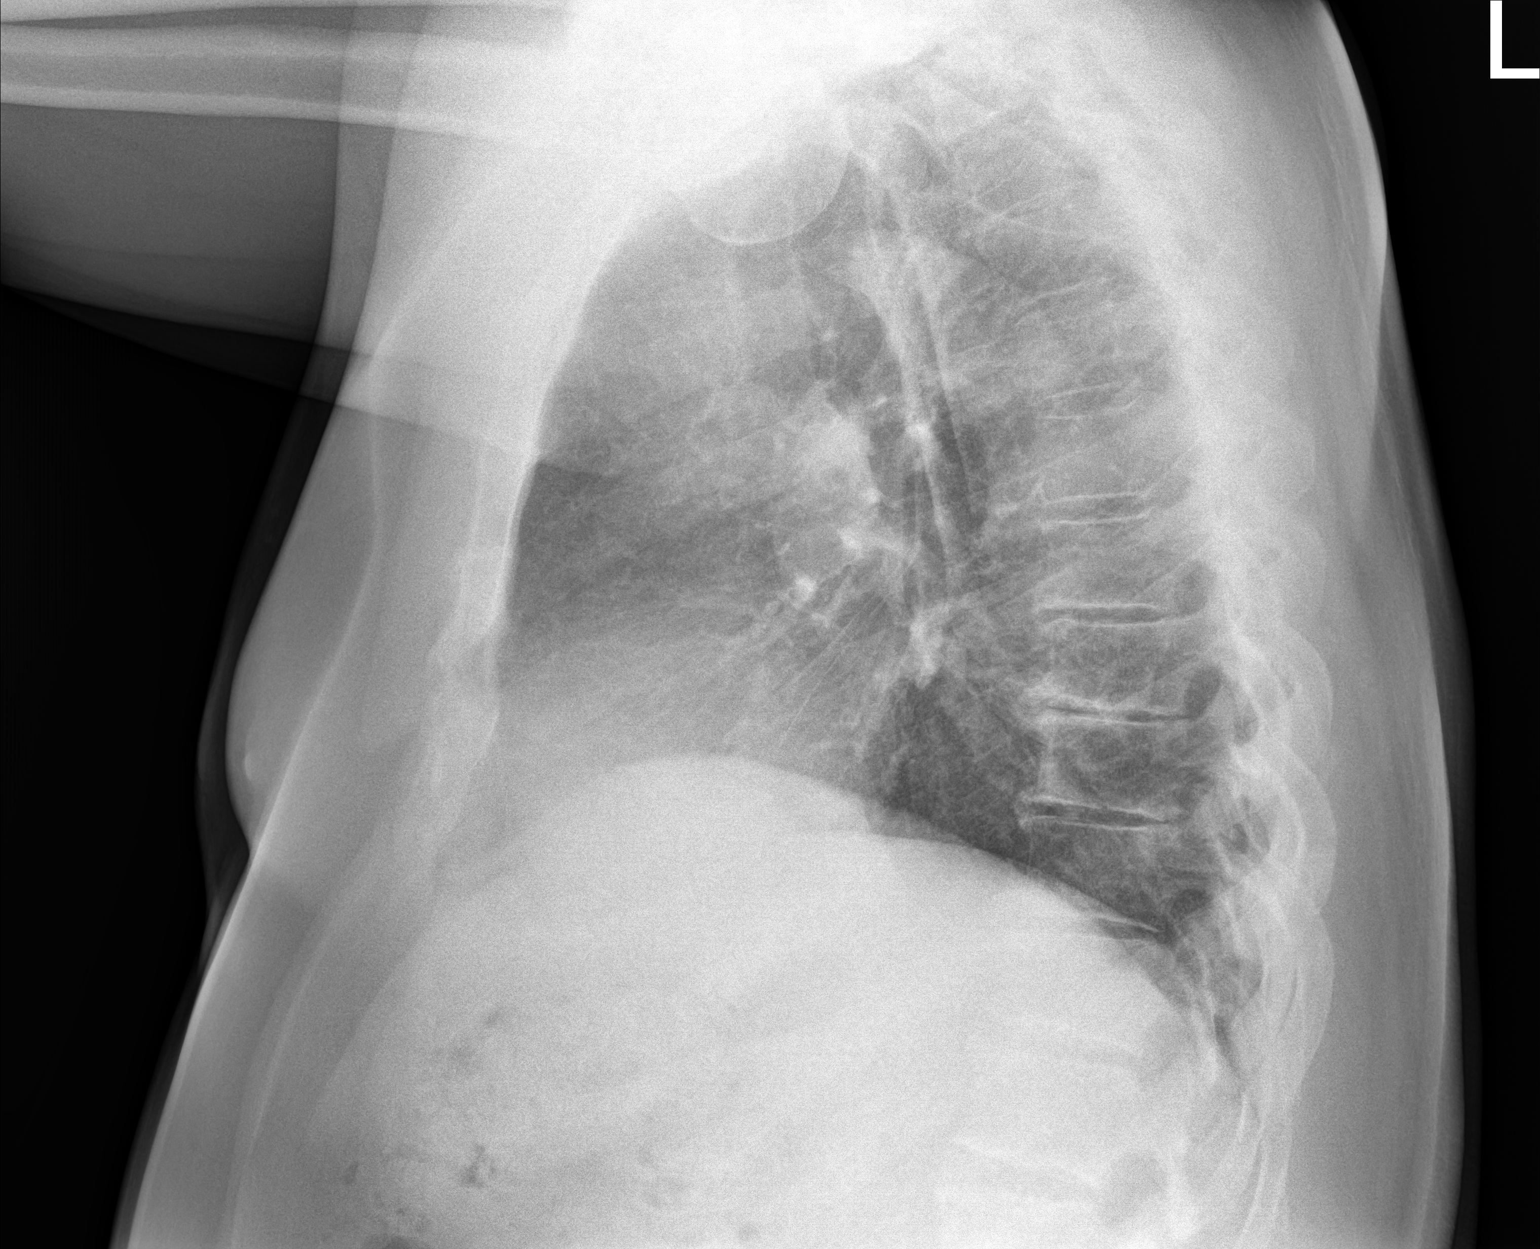

[2 of 2 positions shown; findings below may reference images not displayed]

FINDINGS: The lungs are adequately inflated. There is no focal infiltrate.
There is no pleural effusion. The heart and pulmonary vascularity
are normal. The mediastinum is normal in width. There is mild
multilevel degenerative disc disease of the lower thoracic spine.
IMPRESSION: There is no active cardiopulmonary disease.

## 2020-02-10 ENCOUNTER — Ambulatory Visit: Payer: Medicare HMO

## 2020-03-09 ENCOUNTER — Other Ambulatory Visit: Payer: Self-pay | Admitting: Family Medicine

## 2020-03-09 DIAGNOSIS — E039 Hypothyroidism, unspecified: Secondary | ICD-10-CM

## 2020-03-25 ENCOUNTER — Telehealth: Payer: Self-pay | Admitting: Family Medicine

## 2020-03-25 MED ORDER — VALSARTAN 160 MG PO TABS: 160.0000 mg | ORAL_TABLET | Freq: Every day | ORAL | 0 refills | Status: DC

## 2020-03-25 NOTE — Telephone Encounter (Signed)
Done, pt has appt 04/01/20

## 2020-04-01 ENCOUNTER — Encounter: Payer: Self-pay | Admitting: Family Medicine

## 2020-04-01 ENCOUNTER — Ambulatory Visit (INDEPENDENT_AMBULATORY_CARE_PROVIDER_SITE_OTHER): Payer: Medicare HMO | Admitting: Family Medicine

## 2020-04-01 ENCOUNTER — Other Ambulatory Visit: Payer: Self-pay

## 2020-04-01 VITALS — BP 98/59 | HR 89 | Temp 98.4°F | Ht 67.0 in | Wt 206.8 lb

## 2020-04-01 DIAGNOSIS — R002 Palpitations: Secondary | ICD-10-CM

## 2020-04-01 DIAGNOSIS — I10 Essential (primary) hypertension: Secondary | ICD-10-CM | POA: Diagnosis not present

## 2020-04-01 DIAGNOSIS — Z125 Encounter for screening for malignant neoplasm of prostate: Secondary | ICD-10-CM

## 2020-04-01 DIAGNOSIS — E039 Hypothyroidism, unspecified: Secondary | ICD-10-CM | POA: Diagnosis not present

## 2020-04-01 DIAGNOSIS — G4733 Obstructive sleep apnea (adult) (pediatric): Secondary | ICD-10-CM | POA: Diagnosis not present

## 2020-04-01 DIAGNOSIS — E782 Mixed hyperlipidemia: Secondary | ICD-10-CM | POA: Diagnosis not present

## 2020-04-01 MED ORDER — LEVOTHYROXINE SODIUM 25 MCG PO TABS: 25.0000 ug | ORAL_TABLET | Freq: Every day | ORAL | 3 refills | Status: DC

## 2020-04-01 MED ORDER — VALSARTAN 160 MG PO TABS: 80.0000 mg | ORAL_TABLET | Freq: Every day | ORAL | 0 refills | Status: DC

## 2020-04-01 NOTE — Progress Notes (Signed)
Subjective: CC: Hypothyroidism, hypertension, hyperlipidemia PCP: Janora Norlander, DO [id number] Daniel Macias is a 70 y.o. male presenting to clinic today for:  1.  Hypothyroidism Patient reports compliance with Synthroid 25 mcg daily.  He has been having "engine like sensation" in his chest where he will occasionally have a skipped beat.  He denies any change in bowel habits, weight or energy.  2.  OSA on CPAP Last sleep study was over 3 years ago with what he thinks was Financial controller.  He wants to be referred to Ann[facility] for ongoing sleep management.  He reports 5 to 6 hours of restful sleep per night but often finds it difficult to fall asleep.  He does not want any medication assistance for insomnia.  3.  Hypertension with hyperlipidemia Patient successfully come off of his Diovan because he lost quite a bit of weight.  He gained about 12 pounds over the holidays and subsequently had blood pressures of systolic above 888 and started back on his Diovan 160.  He is monitoring his blood pressures at home and they typically are in systolics of 280K.  He does not report any dizziness or chest pain.  No change in exercise tolerance.   ROS: Per HPI  Allergies  Allergen Reactions  . Morphine And Related Itching  . Lisinopril Cough   Past Medical History:  Diagnosis Date  . Allergy   . Carpal tunnel syndrome, bilateral   . Hx of colonic polyps   . Hyperlipidemia   . Hypertension   . Lower leg fracture   . Sleep apnea    CPAP  . Vitamin D deficiency     Current Outpatient Medications:  .  aspirin EC 81 MG tablet, Take 81 mg by mouth daily., Disp: , Rfl:  .  azelastine (ASTELIN) 0.1 % nasal spray, Place 2 sprays into both nostrils at bedtime. Use in each nostril as directed, Disp: 30 mL, Rfl: 2 .  fluticasone (FLONASE) 50 MCG/ACT nasal spray, PLACE 2 SPRAYS INTO BOTH NOSTRILS DAILY AS NEEDED., Disp: 48 mL, Rfl: 1 .  Ergocalciferol (VITAMIN D2) 10 MCG (400 UNIT) TABS, , Disp: , Rfl:   .  levothyroxine (SYNTHROID) 25 MCG tablet, Take 1 tablet (25 mcg total) by mouth daily before breakfast., Disp: 90 tablet, Rfl: 3 .  Omega-3 Fatty Acids (FISH OIL) 1000 MG CAPS, , Disp: , Rfl:  .  valsartan (DIOVAN) 160 MG tablet, Take 0.5 tablets (80 mg total) by mouth daily., Disp: 90 tablet, Rfl: 0 Social History   Socioeconomic History  . Marital status: Married    Spouse name: linda  . Number of children: 2  . Years of education: Not on file  . Highest education level: Not on file  Occupational History  . Occupation: retired     Comment: Architect   Tobacco Use  . Smoking status: Never Smoker  . Smokeless tobacco: Never Used  Vaping Use  . Vaping Use: Never used  Substance and Sexual Activity  . Alcohol use: Yes    Comment: occasional  . Drug use: No  . Sexual activity: Yes  Other Topics Concern  . 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    Social Determinants of Health   Financial Resource Strain: Not on file  Food Insecurity: Not on file  Transportation Needs: Not on file  Physical Activity: Not on file  Stress: Not on file  Social Connections: Not on file  Intimate Partner Violence: Not  on file   Family History  Problem Relation Age of Onset  . Hypertension Mother   . Cancer Mother        uterine -METS to lungs  . Diabetes Mother   . Emphysema Father   . Heart disease Father   . Heart attack Brother 57       x 2   . Heart disease Brother   . Hypertension Brother   . Diabetes Brother   . Crohn's disease Son   . Heart attack Paternal Grandfather     Objective: Office vital signs reviewed. BP (!) 98/59   Pulse 89   Temp 98.4 F (36.9 C) (Temporal)   Ht '5\' 7"'  (1.702 m)   Wt 206 lb 12.8 oz (93.8 kg)   SpO2 96%   BMI 32.39 kg/m   Physical Examination:  General: Awake, alert, well nourished, No acute distress HEENT: Normal; no exophthalmos.  No goiter.  Moist mucous membranes Cardio: regular rate and rhythm,  S1S2 heard, no murmurs appreciated Pulm: clear to auscultation bilaterally, no wheezes, rhonchi or rales; normal work of breathing on room air Extremities: warm, well perfused, No edema, cyanosis or clubbing; +2 pulses bilaterally MSK: normal gait and station Skin: dry; intact; no rashes or lesions, normal temp Neuro: no tremor  Assessment/ Plan: 70 y.o. male   Acquired hypothyroidism - Plan: levothyroxine (SYNTHROID) 25 MCG tablet, Thyroid Panel With TSH, CANCELED: Thyroid Panel With TSH  Essential hypertension  Obstructive sleep apnea - Plan: Ambulatory referral to Sleep Studies  Heart palpitations - Plan: EKG 12-Lead, Magnesium  Mixed hyperlipidemia - Plan: Lipid panel, CMP14+EGFR  Screening for malignant neoplasm of prostate - Plan: PSA  He will come in for fasting labs.  We will collect a thyroid panel.  He has been having some fluttering-like sensation or skipped beats in his chest.  Unsure if this is a manifestation of thyroid disorder.  His EKG was not remarkable for any arrhythmia and looked relatively unchanged from his 2019 EKG  Blood pressure was borderline low and I have advised him to cut his Diovan in half and monitor blood pressures.  Goal blood pressure between 161 and 096 systolic over 04-54 systolic.  Watch salt.  May need to totally discontinue Diovan if he is able to control diet  He is not on Crestor anymore.  Check fasting lipid  Check PSA with above labs  No orders of the defined types were placed in this encounter.  No orders of the defined types were placed in this encounter.    Janora Norlander, DO [place] [phone]

## 2020-04-01 NOTE — Patient Instructions (Addendum)
Come in for fasting labs Call me about Daniel Macias young  Blood pressure too low. Cut Diovan in half.  Monitor BP.  Bring BP monitor in for check.  Goal <150/90, but above 110/60  Palpitations Palpitations are feelings that your heartbeat is not normal. Your heartbeat may feel like it is:  Uneven.  Faster than normal.  Fluttering.  Skipping a beat. This is usually not a serious problem. In some cases, you may need tests to rule out any serious problems. Follow these instructions at home: Pay attention to any changes in your condition. Take these actions to help manage your symptoms: Eating and drinking  Avoid: ? Coffee, tea, soft drinks, and energy drinks. ? Chocolate. ? Alcohol. ? Diet pills. Lifestyle  Try to lower your stress. These things can help you relax: ? Yoga. ? Deep breathing and meditation. ? Exercise. ? Using words and images to create positive thoughts (guided imagery). ? Using your mind to control things in your body (biofeedback).  Do not use drugs.  Get plenty of rest and sleep. Keep a regular bed time.   General instructions  Take over-the-counter and prescription medicines only as told by your doctor.  Do not use any products that contain nicotine or tobacco, such as cigarettes and e-cigarettes. If you need help quitting, ask your doctor.  Keep all follow-up visits as told by your doctor. This is important. You may need more tests if palpitations do not go away or get worse.   Contact a doctor if:  Your symptoms last more than 24 hours.  Your symptoms occur more often. Get help right away if you:  Have chest pain.  Feel short of breath.  Have a very bad headache.  Feel dizzy.  Pass out (faint). Summary  Palpitations are feelings that your heartbeat is uneven or faster than normal. It may feel like your heart is fluttering or skipping a beat.  Avoid food and drinks that may cause palpitations. These include caffeine, chocolate, and  alcohol.  Try to lower your stress. Do not smoke or use drugs.  Get help right away if you faint or have chest pain, shortness of breath, a severe headache, or dizziness. This information is not intended to replace advice given to you by your health care provider. Make sure you discuss any questions you have with your health care provider. Document Revised: 03/13/2017 Document Reviewed: 03/13/2017 Elsevier Patient Education  2021 [employer].

## 2020-04-11 ENCOUNTER — Encounter: Payer: Self-pay | Admitting: Family Medicine

## 2020-04-11 ENCOUNTER — Ambulatory Visit (INDEPENDENT_AMBULATORY_CARE_PROVIDER_SITE_OTHER): Payer: Medicare HMO | Admitting: Family Medicine

## 2020-04-11 DIAGNOSIS — J029 Acute pharyngitis, unspecified: Secondary | ICD-10-CM

## 2020-04-11 LAB — CULTURE, GROUP A STREP

## 2020-04-11 LAB — RAPID STREP SCREEN (MED CTR MEBANE ONLY): Strep Gp A Ag, IA W/Reflex: NEGATIVE

## 2020-04-11 NOTE — Progress Notes (Signed)
   Virtual Visit via telephone Note Due to COVID-19 pandemic this visit was conducted virtually. This visit type was conducted due to national recommendations for restrictions regarding the COVID-19 Pandemic (e.g. social distancing, sheltering in place) in an effort to limit this patient's exposure and mitigate transmission in our community. All issues noted in this document were discussed and addressed.  A physical exam was not performed with this format.  I connected with Daniel Macias on 04/11/20 at 0815 by telephone and verified that I am speaking with the correct person using two identifiers. Daniel Macias is currently located at home and his wife is currently with him during the visit. The provider, Gwenlyn Perking, FNP is located in their office at time of visit.  I discussed the limitations, risks, security and privacy concerns of performing an evaluation and management service by telephone and the availability of in person appointments. I also discussed with the patient that there may be a patient responsible charge related to this service. The patient expressed understanding and agreed to proceed.  CC: sore throat  History and Present Illness:  HPI  Daniel Macias reports a sore throat x 3 days. He reports that his tonsils do look a little swollen and red. He report tenderness in the lymph nodes in his neck. He does not see any exudate. He does report a slight dry cough and mild head congestion. Denies fever or chills. Denies nausea, vomiting, headaches, shortness of breath, or chest pain. He has been using chloraseptic spray for his throat with some relief. He denies known exposure to Covid.    ROS As per HPI.   Observations/Objective: Alert and oriented x 3. Able to speak in full sentences without difficulty.   Assessment and Plan: [name] was seen today for sore throat.  Diagnoses and all orders for this visit:  Sore throat Will come by office today for Strep swab. Discussed  that symptoms could be due to Covid, declined Covid testing today. Also declined flu testing today. Will notify patient of strep results. Stay well hydrated, chloraseptic spay, throat lozenges. -     Rapid Strep Screen ([facility] ONLY); Future   Follow Up Instructions: Return to office for new or worsening symptoms, or if symptoms persist.     I discussed the assessment and treatment plan with the patient. The patient was provided an opportunity to ask questions and all were answered. The patient agreed with the plan and demonstrated an understanding of the instructions.   The patient was advised to call back or seek an in-person evaluation if the symptoms worsen or if the condition fails to improve as anticipated.  The above assessment and management plan was discussed with the patient. The patient verbalized understanding of and has agreed to the management plan. Patient is aware to call the clinic if symptoms persist or worsen. Patient is aware when to return to the clinic for a follow-up visit. Patient educated on when it is appropriate to go to the emergency department.   Time call ended:  0821  I provided 6 minutes of phone time during this encounter.    Gwenlyn Perking, FNP

## 2020-04-11 NOTE — Addendum Note (Signed)
Addended by: Liliane Bade on: 04/11/2020 01:24 PM   Modules accepted: Orders

## 2020-04-26 NOTE — Progress Notes (Signed)
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 Says he was never called back for f/u after last seen in 2018. Machine is working well, but about 60-58 years old. Discussed replacement and supply chain problems. Discussed treatment alternatives,He is not interested in [place] surgery and has had a lot of dental repair which I think rules out an oral appliance.  No ENT surgery. Snores loudly w/o CPAP. He has been very compliant and is satisfied to continue CPAP.  Prior to Admission medications   Medication Sig Start Date End Date Taking? Authorizing Provider  aspirin EC 81 MG tablet Take 81 mg by mouth daily.   Yes [provider]  azelastine (ASTELIN) 0.1 % nasal spray Place 2 sprays into both nostrils at bedtime. Use in each nostril as directed 04/17/17  Yes Chipper Herb, MD  Ergocalciferol (VITAMIN D2) 10 MCG (400 UNIT) TABS    Yes [provider]  fluticasone (FLONASE) 50 MCG/ACT nasal spray PLACE 2 SPRAYS INTO BOTH NOSTRILS DAILY AS NEEDED. 09/22/18  Yes Terald Sleeper, PA-C  levothyroxine (SYNTHROID) 25 MCG tablet Take 1 tablet (25 mcg total) by mouth daily before breakfast. 04/01/20  Yes Gottschalk, Ashly M, DO  Omega-3 Fatty Acids (FISH OIL) 1000 MG CAPS    Yes [provider]  valsartan (DIOVAN) 160  MG tablet Take 0.5 tablets (80 mg total) by mouth daily. 04/01/20  Yes Janora Norlander, DO   Past Medical History:  Diagnosis Date  . Allergy   . Carpal tunnel syndrome, bilateral   . Hx of colonic polyps   . Hyperlipidemia   . Hypertension   . Lower leg fracture   . Sleep apnea    CPAP  . Vitamin D deficiency    Past Surgical History:  Procedure Laterality Date  . CYST REMOVAL HAND Right    wrist   . LEG SURGERY Left 1971  . SPINE SURGERY  1999   DDD   Family History  Problem Relation Age of Onset  . Hypertension Mother   . Cancer Mother        uterine -METS to lungs  . Diabetes Mother   . Emphysema Father   . Heart disease Father   . Heart attack Brother 57       x 2   . Heart disease Brother   . Hypertension Brother   . Diabetes Brother   . Crohn's disease Son   . Heart attack Paternal Grandfather    Social History   Socioeconomic History  . Marital status: Married  Spouse name: linda  . Number of children: 2  . Years of education: Not on file  . Highest education level: Not on file  Occupational History  . Occupation: retired     Comment: Architect   Tobacco Use  . Smoking status: Never Smoker  . Smokeless tobacco: Never Used  Vaping Use  . Vaping Use: Never used  Substance and Sexual Activity  . Alcohol use: Yes    Comment: occasional  . Drug use: No  . Sexual activity: Yes  Other Topics Concern  . 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    Social Determinants of Health   Financial Resource Strain: Not on file  Food Insecurity: Not on file  Transportation Needs: Not on file  Physical Activity: Not on file  Stress: Not on file  Social Connections: Not on file  Intimate Partner Violence: Not on file   ROS-see HPI   + = positive Constitutional:    weight loss, night sweats, fevers, chills, fatigue, lassitude. HEENT:    headaches, difficulty swallowing, tooth/dental problems, sore throat,       sneezing,  itching, ear ache, nasal congestion, post nasal drip, snoring CV:    chest pain, orthopnea, PND, swelling in lower extremities, anasarca,                                   dizziness, palpitations Resp:   shortness of breath with exertion or at rest.                productive cough,   non-productive cough, coughing up of blood.              change in color of mucus.  wheezing.   Skin:    rash or lesions. GI:  No-   heartburn, indigestion, abdominal pain, nausea, vomiting, diarrhea,                 change in bowel habits, loss of appetite GU: dysuria, change in color of urine, no urgency or frequency.   flank pain. MS:   joint pain, stiffness, decreased range of motion, back pain. Neuro-     nothing unusual Psych:  change in mood or affect.  depression or anxiety.   memory loss.  OBJ- Physical Exam General- Alert, Oriented, Affect-appropriate, Distress- none acute, +overweight/ stocky Skin- rash-none, lesions- none, excoriation- none Lymphadenopathy- none Head- atraumatic            Eyes- Gross vision intact, PERRLA, conjunctivae and secretions clear            Ears- Hearing, canals-normal            Nose- Clear, no-Septal dev, mucus, polyps, erosion, perforation             Throat- Mallampati IV , mucosa clear , drainage- none, tonsils- atrophic, + extensive bridgework/ repair Neck- flexible , trachea midline, no stridor , thyroid nl, carotid no bruit Chest - symmetrical excursion , unlabored           Heart/CV- RRR , no murmur , no gallop  , no rub, nl s1 s2                           - JVD- none , edema- none, stasis changes- none, varices- none           Lung-  clear to P&A, wheeze- none, cough- none , dullness-none, rub- none           Chest wall-  Abd-  Br/ Gen/ Rectal- Not done, not indicated Extrem- cyanosis- none, clubbing, none, atrophy- none, strength- nl Neuro- grossly intact to observation

## 2020-04-27 ENCOUNTER — Ambulatory Visit (INDEPENDENT_AMBULATORY_CARE_PROVIDER_SITE_OTHER): Payer: Medicare HMO | Admitting: Internal Medicine

## 2020-04-27 ENCOUNTER — Other Ambulatory Visit: Payer: Self-pay

## 2020-04-27 ENCOUNTER — Encounter: Payer: Self-pay | Admitting: Internal Medicine

## 2020-04-27 VITALS — BP 110/70 | HR 77 | Temp 97.3°F | Ht 67.0 in | Wt 207.6 lb

## 2020-04-27 DIAGNOSIS — G4733 Obstructive sleep apnea (adult) (pediatric): Secondary | ICD-10-CM

## 2020-04-27 DIAGNOSIS — M4722 Other spondylosis with radiculopathy, cervical region: Secondary | ICD-10-CM | POA: Diagnosis not present

## 2020-04-27 NOTE — Patient Instructions (Signed)
Order- DME [employer]- please replace old CPAP machine, autopap 5-20, mask of choice, humidifier, supplies, AirView/ card We need download on current machine  Please call if we can help

## 2020-04-27 NOTE — Assessment & Plan Note (Signed)
He has been compliant with CPAP auto 5-15. Awaiting download for confirmation. After discussion, we will replace old CPAP machine, continuing 5-15.  He needs to give serial number to DME so they can install download. Supply chain delay discussed.

## 2020-04-27 NOTE — Assessment & Plan Note (Signed)
He reports that an osteophyte is pressing trachea. He will discuss sleep apnea issues with surgeon when he commits to surgical repaiir.

## 2020-05-21 IMAGING — DX DG CERVICAL SPINE COMPLETE 4+V
5 series · 5 of 5 positions shown · non-contrast
Comparison: None.

CLINICAL DATA: Cervicalgia

EXAM:
CERVICAL SPINE - COMPLETE 4+ VIEW

[c-spine lat]
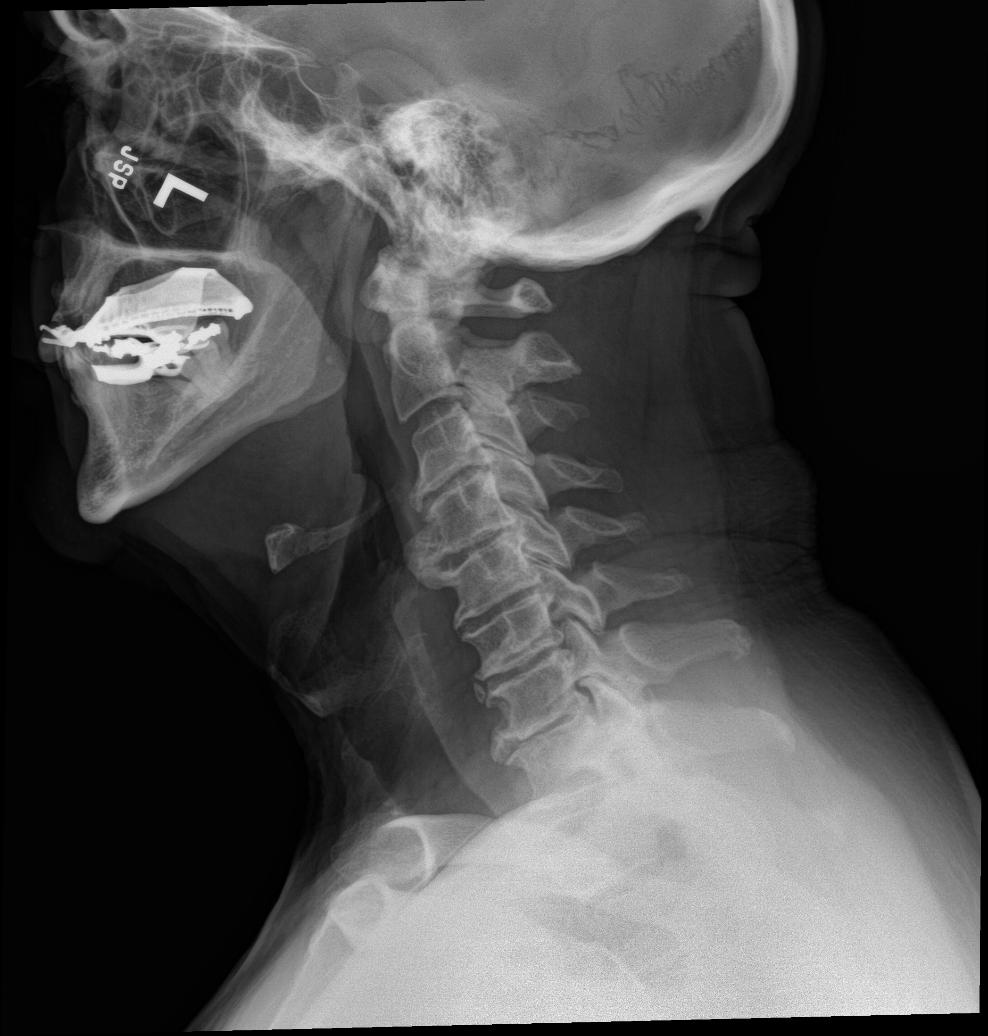

[c-spine obl (1 of 2)]
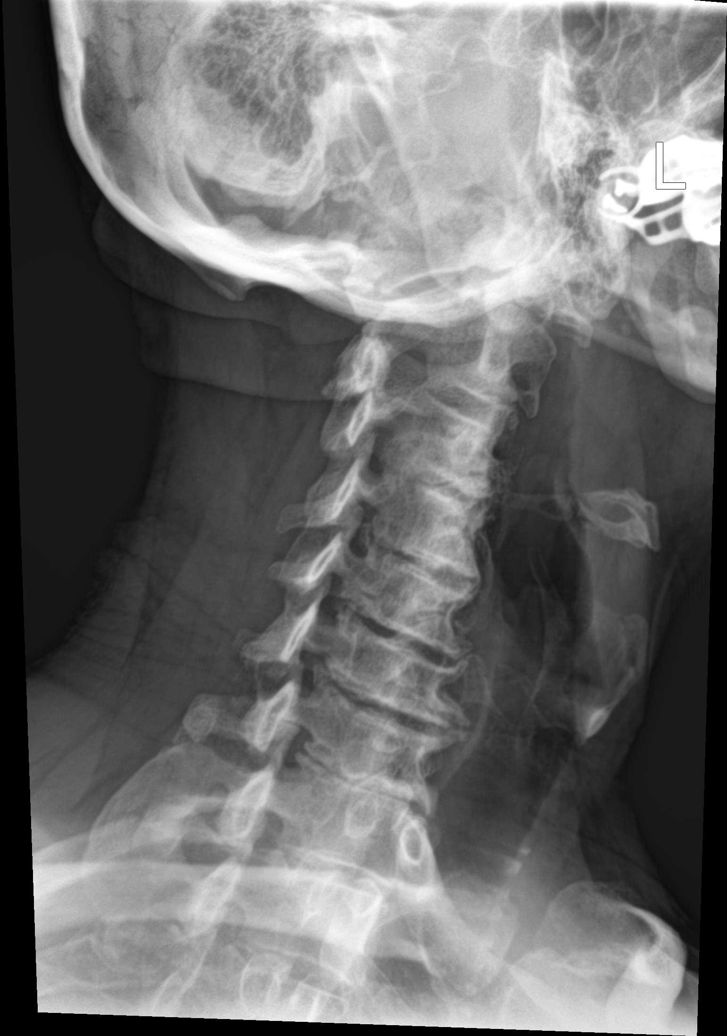

[c-spine obl (2 of 2)]
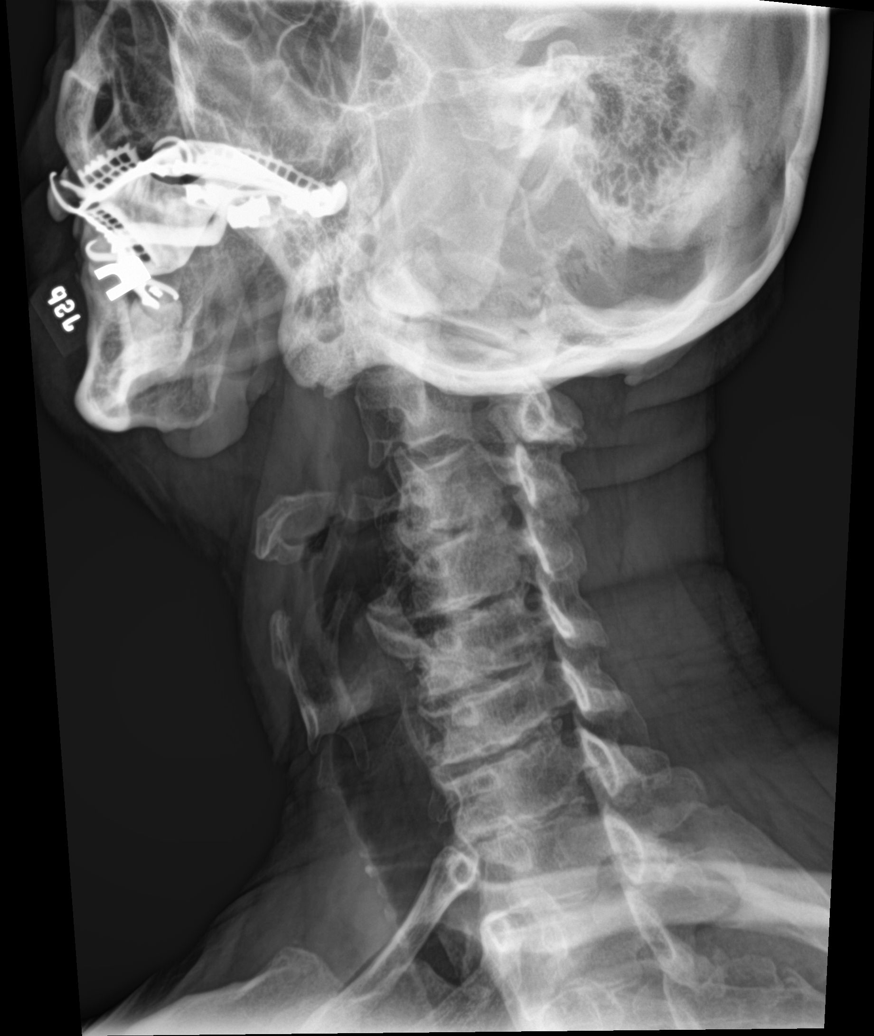

[c-spine ap]
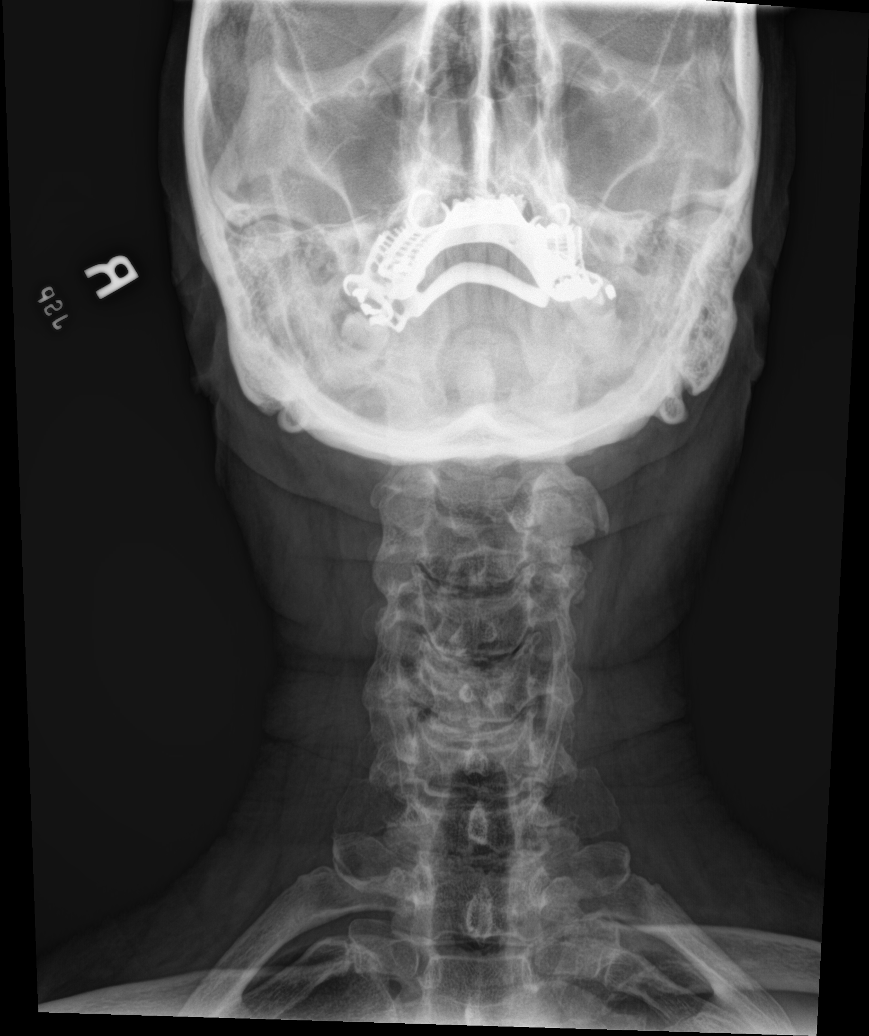

[c-spine open mouth]
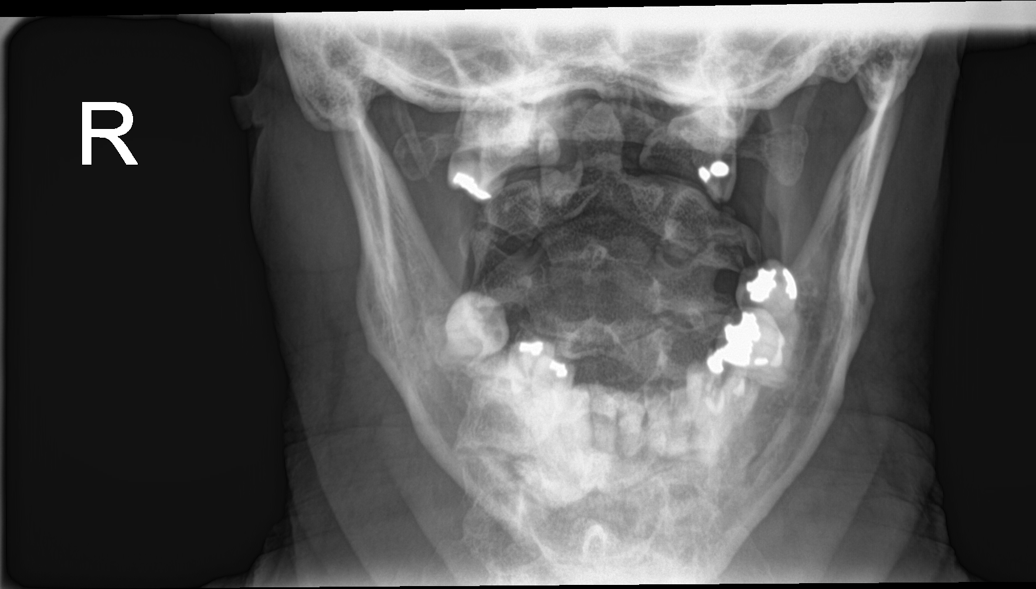

[5 of 5 positions shown; findings below may reference images not displayed]

FINDINGS: Frontal, lateral, open-mouth odontoid, and bilateral oblique views
were obtained. There is no fracture or spondylolisthesis.
Prevertebral soft tissues and predental space regions are normal.
There is severe disc space narrowing at C3-4, C4-5, C5-6, C6-7, and
C7-T1. There are anterior osteophytes at all levels except for C2.
There are prominent bridging osteophytes anteriorly at C4 and C5.
There is exit foraminal narrowing due to facet hypertrophy at all
levels except for C2-3. No erosive change. Lung apices are clear.
IMPRESSION: Multilevel osteoarthritic change as noted. No fracture or
spondylolisthesis.

## 2020-06-01 ENCOUNTER — Ambulatory Visit (INDEPENDENT_AMBULATORY_CARE_PROVIDER_SITE_OTHER): Payer: Medicare HMO | Admitting: Family Medicine

## 2020-06-01 ENCOUNTER — Other Ambulatory Visit: Payer: Self-pay

## 2020-06-01 ENCOUNTER — Encounter: Payer: Self-pay | Admitting: Family Medicine

## 2020-06-01 VITALS — BP 132/81 | HR 81 | Temp 96.7°F | Ht 67.0 in | Wt 206.0 lb

## 2020-06-01 DIAGNOSIS — Z125 Encounter for screening for malignant neoplasm of prostate: Secondary | ICD-10-CM

## 2020-06-01 DIAGNOSIS — Z23 Encounter for immunization: Secondary | ICD-10-CM

## 2020-06-01 DIAGNOSIS — Z0000 Encounter for general adult medical examination without abnormal findings: Secondary | ICD-10-CM

## 2020-06-01 DIAGNOSIS — M67432 Ganglion, left wrist: Secondary | ICD-10-CM

## 2020-06-01 DIAGNOSIS — M2041 Other hammer toe(s) (acquired), right foot: Secondary | ICD-10-CM | POA: Diagnosis not present

## 2020-06-01 DIAGNOSIS — E039 Hypothyroidism, unspecified: Secondary | ICD-10-CM

## 2020-06-01 DIAGNOSIS — G4733 Obstructive sleep apnea (adult) (pediatric): Secondary | ICD-10-CM

## 2020-06-01 DIAGNOSIS — Z130 Encounter for screening for diseases of the blood and blood-forming organs and certain disorders involving the immune mechanism: Secondary | ICD-10-CM

## 2020-06-01 DIAGNOSIS — Z0001 Encounter for general adult medical examination with abnormal findings: Secondary | ICD-10-CM | POA: Diagnosis not present

## 2020-06-01 DIAGNOSIS — I10 Essential (primary) hypertension: Secondary | ICD-10-CM

## 2020-06-01 NOTE — Addendum Note (Signed)
Addended by: Everlean Cherry on: 06/01/2020 09:52 AM   Modules accepted: Orders

## 2020-06-01 NOTE — Patient Instructions (Signed)
Try alpha lipoic acid for numbness/ tingling (600mg  daily)  https://orthoinfo.aaos.org/en/diseases--conditions/hammer-toe">  Hammer Toe Hammer toe is a change in the shape, or a deformity, of the toe. The deformity causes the middle joint of the toe to stay bent. Hammer toe starts gradually. At first, the toe can be straightened. Then over time, the toe deformity becomes stiff, inflexible, and permanently bent. Hammer toe usually affects the second, third, or fourth toe. A hammer toe causes pain, especially when wearing shoes. Corns and calluses can result from the toe rubbing against the inside of the shoe. Early treatments to keep the toe straight may relieve pain. As the deformity of the toe becomes stiff and permanent, surgery may be needed to straighten the toe. What are the causes? This condition is caused by abnormal bending of the toe joint that is closest to your foot. Over time, the toe bending downward pulls on the muscles and connections (tendons) of the toe joint, making them weak and stiff. Wearing shoes that are too narrow in the toe box and do not allow toes to fully straighten can cause this condition. What increases the risk? You are more likely to develop this condition if you:  Are an older male.  Wear shoes that are too small, or wear high-heeled shoes that pinch your toes.  Have a second toe that is longer than your big toe (first toe).  Injure your foot or toe.  Have arthritis, or have a nerve or muscle disorder.  Have diabetes or a condition known as Charcot joint, which may cause you to walk abnormally.  Have a family history of hammer toe.  Are a Engineer, mining. What are the signs or symptoms? Pain and deformity of the toe are the main symptoms of this condition. The pain is worse when wearing shoes, walking, or running. Other symptoms may include:  A thickened patch of skin, called a corn or callus, that forms over the top of the bent part of the toe or  between the toes.  Redness and a burning feeling on the bent toe.  An open sore that forms on the top of the bent toe.  Not being able to straighten the affected toe.   How is this diagnosed? This condition is diagnosed based on your symptoms and a physical exam. During the exam, your health care provider will try to straighten your toe to see how stiff the deformity is. You may also have tests, such as:  A blood test to check for rheumatoid arthritis or diabetes.  An X-ray to show how severe the toe deformity is. How is this treated? Treatment for this condition depends on whether the toe is flexible or deformed and no longer moveable. In less severe cases, a hammer toe can be straightened without surgery. These treatments include:  Taping the toe into a straightened position.  Using pads and cushions to protect the bent toe.  Wearing shoes that provide enough room for the toes.  Doing toe-stretching exercises at home.  Taking an NSAID, such as ibuprofen, to reduce pain and swelling.  Using special orthotics or insoles for pain relief and to improve walking. If these treatments do not help or the toe has a severe deformity and cannot be straightened, surgery is the next option. The most common surgeries used to straighten a hammer toe include:  Arthroplasty or osteotomy. Part of the toe joint is reconstructed or removed, which allows the toe to straighten.  Fusion. Cartilage between the two bones of the  joint is taken out, and the bones are fused together into one longer bone.  Implantation. Part of the bone is removed and replaced with an implant to allow the toe to move again.  Flexor tendon transfer. The tendons that curl the toes down (flexor tendons) are repositioned. Follow these instructions at home:  Take over-the-counter and prescription medicines only as told by your health care provider.  Do toe-straightening and stretching exercises as told by your health care  provider.  Keep all follow-up visits. This is important. How is this prevented?  Wear shoes that fit properly and give your toes enough room. Shoes should not cause pain.  Buy shoes at the end of the day to make sure they fit well, since your foot may swell during the day. Make sure they are comfortable before you buy them.  As you age, your shoe size might change, including the width. Measure both feet and buy shoes for the larger foot.  A shoe repair store might be able to stretch shoes that feel tight in spots.  Do not wear high-heeled shoes or shoes with pointed toes. Contact a health care provider if:  Your pain gets worse.  Your toe becomes red or swollen.  You develop an open sore on your toe. Summary  Hammer toe is a condition that gradually causes your toe to become bent and stiff.  Hammer toe can be treated by taping the toe into a straightened position and doing toe-stretching exercises. If these treatments do not help, surgery may be needed.  To prevent this condition, wear shoes that fit properly, give your toes enough room, and do not cause pain. This information is not intended to replace advice given to you by your health care provider. Make sure you discuss any questions you have with your health care provider. Document Revised: 05/07/2019 Document Reviewed: 05/07/2019 Elsevier Patient Education  2021 [place].  https://www.foothealthfacts.org/conditions/ganglion-cyst"> https://www.clinicalkey.com">  Ganglion Cyst  A ganglion cyst is a non-cancerous, fluid-filled lump of tissue that occurs near a joint, tendon, or ligament. The cyst grows out of a joint or the lining of a tendon or ligament. Ganglion cysts most often develop in the hand or wrist, but they can also develop in the shoulder, elbow, hip, knee, ankle, or foot. Ganglion cysts are ball-shaped or egg-shaped. Their size can range from the size of a pea to larger than a grape. Increased activity may  cause the cyst to get bigger because more fluid starts to build up. What are the causes? The exact cause of this condition is not known, but it may be related to:  Inflammation or irritation around the joint.  An injury or tear in the layers of tissue around the joint (joint capsule).  Repetitive movements or overuse.  History of acute or repeated injury. What increases the risk? You are more likely to develop this condition if:  You are a male.  You are 41-65 years old. What are the signs or symptoms? The main symptom of this condition is a lump. It most often appears on the hand or wrist. In many cases, there are no other symptoms, but a cyst can sometimes cause:  Tingling.  Pain or tenderness.  Numbness.  Weakness or loss of strength in the affected joint.  Decreased range of motion in the affected area of the body.   How is this diagnosed? Ganglion cysts are usually diagnosed based on a physical exam. Your health care provider will feel the lump and may shine  a light next to it. If it is a ganglion cyst, the light will likely shine through it. Your health care provider may order an X-ray, ultrasound, MRI, or CT scan to rule out other conditions. How is this treated? Ganglion cysts often go away on their own without treatment. If you have pain or other symptoms, treatment may be needed. Treatment is also needed if the ganglion cyst limits your movement or if it gets infected. Treatment may include:  Wearing a brace or splint on your wrist or finger.  Taking anti-inflammatory medicine.  Having fluid drained from the lump with a needle (aspiration).  Getting an injection of medicine into the joint to decrease inflammation. This may be corticosteroids, ethanol, or hyaluronidase.  Having surgery to remove the ganglion cyst.  Placing a pad in your shoe or wearing shoes that will not rub against the cyst if it is on your foot. Follow these instructions at home:  Do not  press on the ganglion cyst, poke it with a needle, or hit it.  Take over-the-counter and prescription medicines only as told by your health care provider.  If you have a brace or splint: ? Wear it as told by your health care provider. ? Remove it as told by your health care provider. Ask if you need to remove it when you take a shower or a bath.  Watch your ganglion cyst for any changes.  Keep all follow-up visits as told by your health care provider. This is important. Contact a health care provider if:  Your ganglion cyst becomes larger or more painful.  You have pus coming from the lump.  You have weakness or numbness in the affected area.  You have a fever or chills. Get help right away if:  You have a fever and have any of these in the cyst area: ? Increased redness. ? Red streaks. ? Swelling. Summary  A ganglion cyst is a non-cancerous, fluid-filled lump that occurs near a joint, tendon, or ligament.  Ganglion cysts most often develop in the hand or wrist, but they can also develop in the shoulder, elbow, hip, knee, ankle, or foot.  Ganglion cysts often go away on their own without treatment. This information is not intended to replace advice given to you by your health care provider. Make sure you discuss any questions you have with your health care provider. Document Revised: 04/22/2019 Document Reviewed: 04/22/2019 Elsevier Patient Education  [place].

## 2020-06-01 NOTE — Progress Notes (Signed)
Daniel Macias is a 70 y.o. male presents to office today for annual physical exam examination.    Concerns today include: 1.  Wrist cyst Patient thinks he has a ganglion cyst in the left wrist.  He is worried that it may involve the vessel when we will have a look at this.  He has history of ganglion cyst excision on the right.  He denies any pain, swelling, change in function or sensation in the left hand (has baseline numbness and tingling secondary to cervical disc disease but this is unchanged)  2.  Hammertoe Patient thinks he has a hammertoe on the right third toe.  He denies any pain or skin breakdown.  Just wanted to know what to do about it and why it happens  3.  Hypothyroidism Patient compliant with Synthroid.  No change in voice, difficulty swallowing, tremor, heart palpitations or change in bowel habits.  Marital status: Married, Substance use: None Diet: Balanced, Exercise: Stays physically active Last eye exam: Up-to-date Last dental exam: Up-to-date Last colonoscopy: Up-to-date Refills needed today: Flonase Immunizations needed: Shingles vaccine needed Immunization History  Administered Date(s) Administered  . Fluad Quad(high Dose 65+) 12/25/2018, 12/26/2019  . Influenza, High Dose Seasonal PF 11/28/2017  . Influenza,inj,Quad PF,6+ Mos 12/01/2014  . Influenza-Unspecified 11/12/2012, 12/15/2015  . PFIZER(Purple Top)SARS-COV-2 Vaccination 04/05/2019, 04/29/2019, 01/22/2020  . Pneumococcal Conjugate-13 06/01/2014, 11/15/2014  . Pneumococcal Polysaccharide-23 06/02/2015, 11/14/2015  . Tdap 11/25/2012  . Zoster 05/28/2012     Past Medical History:  Diagnosis Date  . Allergy   . Carpal tunnel syndrome, bilateral   . Hx of colonic polyps   . Hyperlipidemia   . Hypertension   . Lower leg fracture   . Sleep apnea    CPAP  . Vitamin D deficiency    Social History   Socioeconomic History  . Marital status: Married    Spouse name: Daniel Macias  . Number of  children: 2  . Years of education: Not on file  . Highest education level: Not on file  Occupational History  . Occupation: retired     Comment: Architect   Tobacco Use  . Smoking status: Never Smoker  . Smokeless tobacco: Never Used  Vaping Use  . Vaping Use: Never used  Substance and Sexual Activity  . Alcohol use: Yes    Comment: occasional  . Drug use: No  . Sexual activity: Yes  Other Topics Concern  . 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    Social Determinants of Health   Financial Resource Strain: Not on file  Food Insecurity: Not on file  Transportation Needs: Not on file  Physical Activity: Not on file  Stress: Not on file  Social Connections: Not on file  Intimate Partner Violence: Not on file   Past Surgical History:  Procedure Laterality Date  . CYST REMOVAL HAND Right    wrist   . LEG SURGERY Left 1971  . SPINE SURGERY  1999   DDD   Family History  Problem Relation Age of Onset  . Hypertension Mother   . Cancer Mother        uterine -METS to lungs  . Diabetes Mother   . Emphysema Father   . Heart disease Father   . Heart attack Brother 57       x 2   . Heart disease Brother   . Hypertension Brother   . Diabetes Brother   . Crohn's disease Son   .  Heart attack Paternal Grandfather     Current Outpatient Medications:  .  aspirin EC 81 MG tablet, Take 81 mg by mouth daily., Disp: , Rfl:  .  azelastine (ASTELIN) 0.1 % nasal spray, Place 2 sprays into both nostrils at bedtime. Use in each nostril as directed, Disp: 30 mL, Rfl: 2 .  Ergocalciferol (VITAMIN D2) 10 MCG (400 UNIT) TABS, , Disp: , Rfl:  .  fluticasone (FLONASE) 50 MCG/ACT nasal spray, PLACE 2 SPRAYS INTO BOTH NOSTRILS DAILY AS NEEDED., Disp: 48 mL, Rfl: 1 .  levothyroxine (SYNTHROID) 25 MCG tablet, Take 1 tablet (25 mcg total) by mouth daily before breakfast., Disp: 90 tablet, Rfl: 3 .  Omega-3 Fatty Acids (FISH OIL) 1000 MG CAPS, , Disp: , Rfl:  .   valsartan (DIOVAN) 160 MG tablet, Take 0.5 tablets (80 mg total) by mouth daily. (Patient not taking: Reported on 06/01/2020), Disp: 90 tablet, Rfl: 0  Allergies  Allergen Reactions  . Morphine And Related Itching  . Lisinopril Cough     ROS: Review of Systems Pertinent items noted in HPI and remainder of comprehensive ROS otherwise negative.    Physical exam BP 132/81 Comment: his machine  Pulse 81   Temp (!) 96.7 F (35.9 C)   Ht 5' 7" (1.702 m)   Wt 206 lb (93.4 kg)   SpO2 97%   BMI 32.26 kg/m  General appearance: alert, cooperative, appears stated age and no distress Head: Normocephalic, without obvious abnormality, atraumatic Eyes: negative findings: lids and lashes normal, conjunctivae and sclerae normal, corneas clear and pupils equal, round, reactive to light and accomodation Ears: normal TM's and external ear canals both ears Nose: Nares normal. Septum midline. Mucosa normal. No drainage or sinus tenderness. Throat: Lips, mucosa normal.  He has many caps.  Oropharynx clear.  No sublingual masses Neck: no adenopathy, no carotid bruit, supple, symmetrical, trachea midline and thyroid not enlarged, symmetric, no tenderness/mass/nodules Back: symmetric, no curvature. ROM normal. No CVA tenderness. Lungs: clear to auscultation bilaterally Chest wall: no tenderness Heart: regular rate and rhythm, S1, S2 normal, no murmur, click, rub or gallop Abdomen: soft, non-tender; bowel sounds normal; no masses,  no organomegaly Extremities: extremities normal, atraumatic, no cyanosis or edema ; well-defined mobile, nontender soft tissue mass noted along the radial aspect of the left wrist.  Lesion is nonpulsatile and does not appear to involve any surrounding vasculature.  He has +2 radial pulses bilaterally Pulses: 2+ and symmetric Skin: Skin color, texture, turgor normal. No rashes or lesions Lymph nodes: Cervical, supraclavicular, and axillary nodes normal. Neurologic: Alert and  oriented X 3, normal strength and tone. Normal symmetric reflexes. Normal coordination and gait; no tremor Foot: Third digit of right foot with decreased extension and apparent hammer toeing  Assessment/ Plan: Daniel Macias here for annual physical exam.   Annual physical exam  Primary hypertension - Plan: CMP14+EGFR, Lipid Panel  Hypothyroidism (acquired) - Plan: TSH, T4, Free  Screening, anemia, deficiency, iron - Plan: CBC  Screening for malignant neoplasm of prostate - Plan: PSA  Ganglion cyst of volar aspect of left wrist  Hammer toe of right foot  Okay to discontinue Diovan.  His blood pressure is totally normal and has been off this medicine for over 3 weeks now.  No need to resume.  Encouraged him to get a reliable blood pressure monitor so that he may continue to monitor at home.  Asymptomatic from a thyroid standpoint.  Continue current dose of thyroid medication.  Check  TSH and free T4  Check CBC, PSA  I agree that lesion on the left wrist is consistent with a ganglion cyst.  Since he is asymptomatic, no need for intervention.  Watchful waiting  Hammertoe of the right foot noted.  Handout provided.  Again protect the dorsal surface of the skin so as to reduce risk of skin breakdown but otherwise no intervention needed if not painful or bothersome  Shingrix dose #1 administered today.  He is up-to-date on all other preventative care measures  Patient to follow up in 1 year for annual exam or in 71mfor thyroid  Ashly M. GLajuana Ripple DO

## 2020-06-02 LAB — CMP14+EGFR
ALT: 25 IU/L (ref 0–44)
AST: 20 IU/L (ref 0–40)
Albumin/Globulin Ratio: 1.4 (ref 1.2–2.2)
Albumin: 4.2 g/dL (ref 3.7–4.7)
Alkaline Phosphatase: 86 IU/L (ref 44–121)
BUN/Creatinine Ratio: 17 (ref 10–24)
BUN: 15 mg/dL (ref 8–27)
Bilirubin Total: 0.4 mg/dL (ref 0.0–1.2)
CO2: 22 mmol/L (ref 20–29)
Calcium: 9.2 mg/dL (ref 8.6–10.2)
Chloride: 100 mmol/L (ref 96–106)
Creatinine, Ser: 0.86 mg/dL (ref 0.76–1.27)
Globulin, Total: 3.1 g/dL (ref 1.5–4.5)
Glucose: 118 mg/dL — ABNORMAL HIGH (ref 65–99)
Potassium: 4.9 mmol/L (ref 3.5–5.2)
Sodium: 137 mmol/L (ref 134–144)
Total Protein: 7.3 g/dL (ref 6.0–8.5)
eGFR: 93 mL/min/{1.73_m2} (ref >59)

## 2020-06-02 LAB — T4, FREE: Free T4: 0.93 ng/dL (ref 0.82–1.77)

## 2020-06-02 LAB — CBC
Hematocrit: 43.8 % (ref 37.5–51.0)
Hemoglobin: 14.5 g/dL (ref 13.0–17.7)
MCH: 29.1 pg (ref 26.6–33.0)
MCHC: 33.1 g/dL (ref 31.5–35.7)
MCV: 88 fL (ref 79–97)
Platelets: 266 10*3/uL (ref 150–450)
RBC: 4.99 x10E6/uL (ref 4.14–5.80)
RDW: 12.7 % (ref 11.6–15.4)
WBC: 6 10*3/uL (ref 3.4–10.8)

## 2020-06-02 LAB — LIPID PANEL
Chol/HDL Ratio: 3.5 ratio (ref 0.0–5.0)
Cholesterol, Total: 194 mg/dL (ref 100–199)
HDL: 56 mg/dL (ref >39)
LDL Chol Calc (NIH): 121 mg/dL — ABNORMAL HIGH (ref 0–99)
Triglycerides: 97 mg/dL (ref 0–149)
VLDL Cholesterol Cal: 17 mg/dL (ref 5–40)

## 2020-06-02 LAB — TSH: TSH: 4.41 u[IU]/mL (ref 0.450–4.500)

## 2020-06-02 LAB — PSA: Prostate Specific Ag, Serum: 1 ng/mL (ref 0.0–4.0)

## 2020-06-07 ENCOUNTER — Other Ambulatory Visit: Payer: Self-pay | Admitting: *Deleted

## 2020-06-07 DIAGNOSIS — E782 Mixed hyperlipidemia: Secondary | ICD-10-CM

## 2020-06-07 MED ORDER — ROSUVASTATIN CALCIUM 10 MG PO TABS: 10.0000 mg | ORAL_TABLET | Freq: Every day | ORAL | 1 refills | Status: DC

## 2020-06-15 ENCOUNTER — Ambulatory Visit (INDEPENDENT_AMBULATORY_CARE_PROVIDER_SITE_OTHER): Payer: Medicare HMO

## 2020-06-15 VITALS — Wt 200.0 lb

## 2020-06-15 DIAGNOSIS — Z0000 Encounter for general adult medical examination without abnormal findings: Secondary | ICD-10-CM

## 2020-06-15 NOTE — Progress Notes (Signed)
Subjective:   Daniel Macias is a 70 y.o. male who presents for Medicare Annual/Subsequent preventive examination.  Virtual Visit via Telephone Note  I connected with  Daniel Macias on 06/15/20 at  3:30 PM EDT by telephone and verified that I am speaking with the correct person using two identifiers.  Location: Patient: Home Provider: [facility] Persons participating in the virtual visit: patient/Nurse Health Advisor   I discussed the limitations, risks, security and privacy concerns of performing an evaluation and management service by telephone and the availability of in person appointments. The patient expressed understanding and agreed to proceed.  Interactive audio and video telecommunications were attempted between this nurse and patient, however failed, due to patient having technical difficulties OR patient did not have access to video capability.  We continued and completed visit with audio only.  Some vital signs may be absent or patient reported.   Daniel Macias E Daniel Sakuma, LPN   Review of Systems     Cardiac Risk Factors include: advanced age (>55men, >43 women);male gender;obesity (BMI >30kg/m2);hypertension     Objective:    Today's Vitals   06/15/20 0916  Weight: 200 lb (90.7 kg)   Body mass index is 31.32 kg/m.  Advanced Directives 06/15/2020 12/11/2018 06/12/2018 06/11/2017 06/05/2016 06/02/2015  Does Patient Have a Medical Advance Directive? Yes Yes Yes Yes Yes No  Type of Paramedic of [place];Living will - [place];Living will Living will [place];Living will -  Does patient want to make changes to medical advance directive? - - No - Patient declined No - Patient declined No - Patient declined -  Copy of [place] in Chart? No - copy requested - No - copy requested - No - copy requested -  Would patient like information on creating a medical advance directive? - - - - - Yes - Educational  materials given    Current Medications (verified) Outpatient Encounter Medications as of 06/15/2020  Medication Sig  . aspirin EC 81 MG tablet Take 81 mg by mouth daily.  Marland Kitchen azelastine (ASTELIN) 0.1 % nasal spray Place 2 sprays into both nostrils at bedtime. Use in each nostril as directed  . Ergocalciferol (VITAMIN D2) 10 MCG (400 UNIT) TABS   . fluticasone (FLONASE) 50 MCG/ACT nasal spray PLACE 2 SPRAYS INTO BOTH NOSTRILS DAILY AS NEEDED.  Marland Kitchen levothyroxine (SYNTHROID) 25 MCG tablet Take 1 tablet (25 mcg total) by mouth daily before breakfast.  . Omega-3 Fatty Acids (FISH OIL) 1000 MG CAPS   . rosuvastatin (CRESTOR) 10 MG tablet Take 1 tablet (10 mg total) by mouth daily.   No facility-administered encounter medications on file as of 06/15/2020.    Allergies (verified) Morphine and related and Lisinopril   History: Past Medical History:  Diagnosis Date  . Allergy   . Carpal tunnel syndrome, bilateral   . Hx of colonic polyps   . Hyperlipidemia   . Hypertension   . Lower leg fracture   . Sleep apnea    CPAP  . Vitamin D deficiency    Past Surgical History:  Procedure Laterality Date  . CYST REMOVAL HAND Right    wrist   . LEG SURGERY Left 1971  . SPINE SURGERY  1999   DDD   Family History  Problem Relation Age of Onset  . Hypertension Mother   . Cancer Mother        uterine -METS to lungs  . Diabetes Mother   . Emphysema  Father   . Heart disease Father   . Heart attack Brother 57       x 2   . Heart disease Brother   . Hypertension Brother   . Diabetes Brother   . Crohn's disease Son   . Heart attack Paternal Grandfather    Social History   Socioeconomic History  . Marital status: Married    Spouse name: Daniel Macias  . Number of children: 2  . Years of education: Not on file  . Highest education level: Not on file  Occupational History  . Occupation: retired     Comment: Architect   Tobacco Use  . Smoking status: Never Smoker  . Smokeless tobacco: Never  Used  Vaping Use  . Vaping Use: Never used  Substance and Sexual Activity  . Alcohol use: Yes    Comment: occasional  . Drug use: No  . Sexual activity: Yes  Other Topics Concern  . 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 son is Dr Wynetta Emery, optometrist at [facility] in [place] Strain: Low Risk   . Difficulty of Paying Living Expenses: Not hard at all  Food Insecurity: No Food Insecurity  . Worried About Charity fundraiser in the Last Year: Never true  . Ran Out of Food in the Last Year: Never true  Transportation Needs: No Transportation Needs  . Lack of Transportation (Medical): No  . Lack of Transportation (Non-Medical): No  Physical Activity: Not on file  Stress: No Stress Concern Present  . Feeling of Stress : Not at all  Social Connections: Socially Integrated  . Frequency of Communication with Friends and Family: More than three times a week  . Frequency of Social Gatherings with Friends and Family: More than three times a week  . Attends Religious Services: More than 4 times per year  . Active Member of Clubs or Organizations: Yes  . Attends Archivist Meetings: More than 4 times per year  . Marital Status: Married    Tobacco Counseling Counseling given: Not Answered   Clinical Intake:  Pre-visit preparation completed: Yes  Pain : No/denies pain     BMI - recorded: 31.32 Nutritional Status: BMI > 30  Obese Nutritional Risks: None Diabetes: No  How often do you need to have someone help you when you read instructions, pamphlets, or other written materials from your doctor or pharmacy?: 1 - Never  Diabetic? No  Interpreter Needed?: No  Information entered by :: Daniel Ringwald, LPN   Activities of Daily Living In your present state of health, do you have any difficulty performing the following activities: 06/15/2020  Hearing? N  Vision? N  Difficulty  concentrating or making decisions? N  Walking or climbing stairs? Y  Comment ankle hurts  Dressing or bathing? N  Doing errands, shopping? N  Preparing Food and eating ? N  Using the Toilet? N  In the past six months, have you accidently leaked urine? N  Do you have problems with loss of bowel control? N  Managing your Medications? N  Managing your Finances? N  Housekeeping or managing your Housekeeping? N  Some recent data might be hidden    Patient Care Team: Janora Norlander, DO as PCP - General (Family Medicine) Juanita Craver, MD (Gastroenterology) Wylene Simmer, MD as Consulting Physician (Orthopedic Surgery) Celestia Khat, [place] (Optometry) Deneise Lever, MD as Consulting Physician (Pulmonary Disease)  Gaynelle Arabian, MD as Consulting Physician (Orthopedic Surgery) Celestia Khat, [place] (Optometry)  Indicate any recent Medical Services you may have received from other than [facility] providers in the past year (date may be approximate).     Assessment:   This is a routine wellness examination for Shawnta.  Hearing/Vision screen  Hearing Screening   125Hz  250Hz  500Hz  1000Hz  2000Hz  3000Hz  4000Hz  6000Hz  8000Hz   Right ear:           Left ear:           Comments: Denies hearing difficulties  Vision Screening Comments: Denies vision difficulties - Annual eye exams with Dr Wynetta Emery at [facility] in [place] (Dr Wynetta Emery is his son) - up to date with eye exams  Dietary issues and exercise activities discussed: Current Exercise Habits: The patient has a physically strenuous job, but has no regular exercise apart from work., Exercise limited by: orthopedic condition(s)  Goals Addressed            This Visit's Progress   . LIFESTYLE - DECREASE FALLS RISK       Goals Addressed             This Visit's Progress   . LIFESTYLE - DECREASE FALLS RISK                Depression Screen PHQ 2/9 Scores 06/15/2020 06/01/2020 04/01/2020 07/09/2019 12/25/2018 11/11/2018 07/01/2018  PHQ - 2  Score 0 0 0 0 0 0 0  PHQ- 9 Score - - 0 - 0 - -    Fall Risk Fall Risk  06/15/2020 06/01/2020 04/01/2020 07/09/2019 12/25/2018  Falls in the past year? 1 1 0 0 1  Number falls in past yr: 1 1 - - -  Injury with Fall? 1 0 - - 1  Risk for fall due to : History of fall(s);Orthopedic patient History of fall(s) - - -  Follow up Education provided;Falls prevention discussed Falls evaluation completed - - -    FALL RISK PREVENTION PERTAINING TO THE HOME:  Any stairs in or around the home? Yes  If so, are there any without handrails? No  Home free of loose throw rugs in walkways, pet beds, electrical cords, etc? Yes  Adequate lighting in your home to reduce risk of falls? Yes   ASSISTIVE DEVICES UTILIZED TO PREVENT FALLS:  Life alert? No  Use of a cane, walker or w/c? No  Grab bars in the bathroom? No  Shower chair or bench in shower? No  Elevated toilet seat or a handicapped toilet? No   TIMED UP AND GO:  Was the test performed? No .  Telephonic visit.  Cognitive Function: Normal cognitive status assessed by direct observation by this Nurse Health Advisor. No abnormalities found.   MMSE - Mini Mental State Exam 06/11/2017 06/05/2016 06/02/2015  Orientation to time 5 5 5   Orientation to Place 5 5 5   Registration 3 3 3   Attention/ Calculation 5 5 5   Recall 3 3 3   Language- name 2 objects 2 2 2   Language- repeat 1 1 1   Language- follow 3 step command 3 3 1   Language- read & follow direction 1 1 1   Write a sentence 1 1 1   Copy design 1 1 1   Total score 30 30 28      6CIT Screen 06/12/2018  What Year? 0 points  What month? 0 points  What time? 0 points  Count back from 20 0 points  Months in reverse 0 points  Repeat  phrase 0 points  Total Score 0    Immunizations Immunization History  Administered Date(s) Administered  . Fluad Quad(high Dose 65+) 12/25/2018, 12/26/2019  . Influenza, High Dose Seasonal PF 11/28/2017  . Influenza,inj,Quad PF,6+ Mos 12/01/2014  .  Influenza-Unspecified 11/12/2012, 12/15/2015  . PFIZER(Purple Top)SARS-COV-2 Vaccination 04/05/2019, 04/29/2019, 01/22/2020  . Pneumococcal Conjugate-13 06/01/2014, 11/15/2014  . Pneumococcal Polysaccharide-23 06/02/2015, 11/14/2015  . Tdap 11/25/2012  . Zoster 05/28/2012  . Zoster Recombinat (Shingrix) 06/01/2020    TDAP status: Up to date  Flu Vaccine status: Up to date  Pneumococcal vaccine status: Up to date  Covid-19 vaccine status: Completed vaccines  Qualifies for Shingles Vaccine? Yes   Zostavax completed No   Shingrix Completed?: No.    Education has been provided regarding the importance of this vaccine. Patient has been advised to call insurance company to determine out of pocket expense if they have not yet received this vaccine. Advised may also receive vaccine at local pharmacy or Health Dept. Verbalized acceptance and understanding.  Screening Tests Health Maintenance  Topic Date Due  . COLON CANCER SCREENING ANNUAL FOBT  09/29/2020 (Originally 01/01/2019)  . Hepatitis C Screening  06/05/2021 (Originally November 29, 1950)  . INFLUENZA VACCINE  09/12/2020  . TETANUS/TDAP  11/26/2022  . COLONOSCOPY (Pts 45-67yrs Insurance coverage will need to be confirmed)  12/20/2029  . COVID-19 Vaccine  Completed  . PNA vac Low Risk Adult  Completed  . HPV VACCINES  Aged Out    Health Maintenance  There are no preventive care reminders to display for this patient.  Colorectal cancer screening: No longer required.   Lung Cancer Screening: (Low Dose CT Chest recommended if Age 43-80 years, 30 pack-year currently smoking OR have quit w/in 15years.) does not qualify.   Additional Screening:  Hepatitis C Screening: does qualify; Needs with next labs  Vision Screening: Recommended annual ophthalmology exams for early detection of glaucoma and other disorders of the eye. Is the patient up to date with their annual eye exam?  Yes  Who is the provider or what is the name of the office  in which the patient attends annual eye exams? Dr Wynetta Emery  If pt is not established with a provider, would they like to be referred to a provider to establish care? No .   Dental Screening: Recommended annual dental exams for proper oral hygiene  Community Resource Referral / Chronic Care Management: CRR required this visit?  No   CCM required this visit?  No      Plan:     I have personally reviewed and noted the following in the patient's chart:   . Medical and social history . Use of alcohol, tobacco or illicit drugs  . Current medications and supplements including opioid prescriptions. Patient is not currently taking opioid prescriptions. . Functional ability and status . Nutritional status . Physical activity . Advanced directives . List of other physicians . Hospitalizations, surgeries, and ER visits in previous 12 months . Vitals . Screenings to include cognitive, depression, and falls . Referrals and appointments  In addition, I have reviewed and discussed with patient certain preventive protocols, quality metrics, and best practice recommendations. A written personalized care plan for preventive services as well as general preventive health recommendations were provided to patient.     Sandrea Hammond, LPN   [id number]   Nurse Notes: None

## 2020-06-15 NOTE — Patient Instructions (Signed)
Daniel Macias , Thank you for taking time to come for your Medicare Wellness Visit. I appreciate your ongoing commitment to your health goals. Please review the following plan we discussed and let me know if I can assist you in the future.   Screening recommendations/referrals: Colonoscopy: Done 12/21/2019 - No repeat required Recommended yearly ophthalmology/optometry visit for glaucoma screening and checkup Recommended yearly dental visit for hygiene and checkup  Vaccinations: Influenza vaccine: Done 12/26/2019 - Repeat annually Pneumococcal vaccine: Done 11/15/2014 & 11/14/2015 Tdap vaccine: Done 11/25/2012 - Repeat every 10 years Shingles vaccine: first dose done 06/01/2020 - Get second dose in 2-6 months   Covid-19: Done 04/05/19, 04/29/19, & 01/22/2020  Advanced directives: Please bring a copy of your health care power of attorney and living will to the office to be added to your chart at your convenience.  Conditions/risks identified: Aim for 30 minutes of exercise or brisk walking each day, drink 6-8 glasses of water and eat lots of fruits and vegetables.  Next appointment: Follow up in one year for your annual wellness visit.   Preventive Care 60 Years and Older, Male  Preventive care refers to lifestyle choices and visits with your health care provider that can promote health and wellness. What does preventive care include?  A yearly physical exam. This is also called an annual well check.  Dental exams once or twice a year.  Routine eye exams. Ask your health care provider how often you should have your eyes checked.  Personal lifestyle choices, including:  Daily care of your teeth and gums.  Regular physical activity.  Eating a healthy diet.  Avoiding tobacco and drug use.  Limiting alcohol use.  Practicing safe sex.  Taking low doses of aspirin every day.  Taking vitamin and mineral supplements as recommended by your health care provider. What happens during an  annual well check? The services and screenings done by your health care provider during your annual well check will depend on your age, overall health, lifestyle risk factors, and family history of disease. Counseling  Your health care provider may ask you questions about your:  Alcohol use.  Tobacco use.  Drug use.  Emotional well-being.  Home and relationship well-being.  Sexual activity.  Eating habits.  History of falls.  Memory and ability to understand (cognition).  Work and work Statistician. Screening  You may have the following tests or measurements:  Height, weight, and BMI.  Blood pressure.  Lipid and cholesterol levels. These may be checked every 5 years, or more frequently if you are over 60 years old.  Skin check.  Lung cancer screening. You may have this screening every year starting at age 49 if you have a 30-pack-year history of smoking and currently smoke or have quit within the past 15 years.  Fecal occult blood test (FOBT) of the stool. You may have this test every year starting at age 41.  Flexible sigmoidoscopy or colonoscopy. You may have a sigmoidoscopy every 5 years or a colonoscopy every 10 years starting at age 39.  Prostate cancer screening. Recommendations will vary depending on your family history and other risks.  Hepatitis C blood test.  Hepatitis B blood test.  Sexually transmitted disease (STD) testing.  Diabetes screening. This is done by checking your blood sugar (glucose) after you have not eaten for a while (fasting). You may have this done every 1-3 years.  Abdominal aortic aneurysm (AAA) screening. You may need this if you are a current or former smoker.  Osteoporosis. You may be screened starting at age 45 if you are at high risk. Talk with your health care provider about your test results, treatment options, and if necessary, the need for more tests. Vaccines  Your health care provider may recommend certain vaccines,  such as:  Influenza vaccine. This is recommended every year.  Tetanus, diphtheria, and acellular pertussis (Tdap, Td) vaccine. You may need a Td booster every 10 years.  Zoster vaccine. You may need this after age 35.  Pneumococcal 13-valent conjugate (PCV13) vaccine. One dose is recommended after age 58.  Pneumococcal polysaccharide (PPSV23) vaccine. One dose is recommended after age 65. Talk to your health care provider about which screenings and vaccines you need and how often you need them. This information is not intended to replace advice given to you by your health care provider. Make sure you discuss any questions you have with your health care provider. Document Released: 02/25/2015 Document Revised: 10/19/2015 Document Reviewed: 11/30/2014 Elsevier Interactive Patient Education  2017 [address] Prevention in the Home Falls can cause injuries. They can happen to people of all ages. There are many things you can do to make your home safe and to help prevent falls. What can I do on the outside of my home?  Regularly fix the edges of walkways and driveways and fix any cracks.  Remove anything that might make you trip as you walk through a door, such as a raised step or threshold.  Trim any bushes or trees on the path to your home.  Use bright outdoor lighting.  Clear any walking paths of anything that might make someone trip, such as rocks or tools.  Regularly check to see if handrails are loose or broken. Make sure that both sides of any steps have handrails.  Any raised decks and porches should have guardrails on the edges.  Have any leaves, snow, or ice cleared regularly.  Use sand or salt on walking paths during winter.  Clean up any spills in your garage right away. This includes oil or grease spills. What can I do in the bathroom?  Use night lights.  Install grab bars by the toilet and in the tub and shower. Do not use towel bars as grab bars.  Use  non-skid mats or decals in the tub or shower.  If you need to sit down in the shower, use a plastic, non-slip stool.  Keep the floor dry. Clean up any water that spills on the floor as soon as it happens.  Remove soap buildup in the tub or shower regularly.  Attach bath mats securely with double-sided non-slip rug tape.  Do not have throw rugs and other things on the floor that can make you trip. What can I do in the bedroom?  Use night lights.  Make sure that you have a light by your bed that is easy to reach.  Do not use any sheets or blankets that are too big for your bed. They should not hang down onto the floor.  Have a firm chair that has side arms. You can use this for support while you get dressed.  Do not have throw rugs and other things on the floor that can make you trip. What can I do in the kitchen?  Clean up any spills right away.  Avoid walking on wet floors.  Keep items that you use a lot in easy-to-reach places.  If you need to reach something above you, use a strong step  stool that has a grab bar.  Keep electrical cords out of the way.  Do not use floor polish or wax that makes floors slippery. If you must use wax, use non-skid floor wax.  Do not have throw rugs and other things on the floor that can make you trip. What can I do with my stairs?  Do not leave any items on the stairs.  Make sure that there are handrails on both sides of the stairs and use them. Fix handrails that are broken or loose. Make sure that handrails are as long as the stairways.  Check any carpeting to make sure that it is firmly attached to the stairs. Fix any carpet that is loose or worn.  Avoid having throw rugs at the top or bottom of the stairs. If you do have throw rugs, attach them to the floor with carpet tape.  Make sure that you have a light switch at the top of the stairs and the bottom of the stairs. If you do not have them, ask someone to add them for you. What  else can I do to help prevent falls?  Wear shoes that:  Do not have high heels.  Have rubber bottoms.  Are comfortable and fit you well.  Are closed at the toe. Do not wear sandals.  If you use a stepladder:  Make sure that it is fully opened. Do not climb a closed stepladder.  Make sure that both sides of the stepladder are locked into place.  Ask someone to hold it for you, if possible.  Clearly mark and make sure that you can see:  Any grab bars or handrails.  First and last steps.  Where the edge of each step is.  Use tools that help you move around (mobility aids) if they are needed. These include:  Canes.  Walkers.  Scooters.  Crutches.  Turn on the lights when you go into a dark area. Replace any light bulbs as soon as they burn out.  Set up your furniture so you have a clear path. Avoid moving your furniture around.  If any of your floors are uneven, fix them.  If there are any pets around you, be aware of where they are.  Review your medicines with your doctor. Some medicines can make you feel dizzy. This can increase your chance of falling. Ask your doctor what other things that you can do to help prevent falls. This information is not intended to replace advice given to you by your health care provider. Make sure you discuss any questions you have with your health care provider. Document Released: 11/25/2008 Document Revised: 07/07/2015 Document Reviewed: 03/05/2014 Elsevier Interactive Patient Education  2017 [employer].

## 2020-06-20 ENCOUNTER — Other Ambulatory Visit: Payer: Self-pay | Admitting: Family Medicine

## 2020-07-14 ENCOUNTER — Other Ambulatory Visit: Payer: Self-pay | Admitting: Family Medicine

## 2020-07-25 ENCOUNTER — Other Ambulatory Visit: Payer: Self-pay | Admitting: *Deleted

## 2020-07-25 MED ORDER — FLUTICASONE PROPIONATE 50 MCG/ACT NA SUSP: 2.0000 | Freq: Every day | NASAL | 1 refills | Status: AC | PRN

## 2020-07-30 ENCOUNTER — Other Ambulatory Visit: Payer: Self-pay | Admitting: Family Medicine

## 2020-08-26 ENCOUNTER — Other Ambulatory Visit: Payer: Self-pay | Admitting: Family Medicine

## 2020-09-13 ENCOUNTER — Other Ambulatory Visit: Payer: Self-pay

## 2020-09-13 ENCOUNTER — Other Ambulatory Visit: Payer: Medicare HMO

## 2020-09-13 DIAGNOSIS — E039 Hypothyroidism, unspecified: Secondary | ICD-10-CM | POA: Diagnosis not present

## 2020-09-13 DIAGNOSIS — Z125 Encounter for screening for malignant neoplasm of prostate: Secondary | ICD-10-CM

## 2020-09-13 DIAGNOSIS — E782 Mixed hyperlipidemia: Secondary | ICD-10-CM

## 2020-09-13 DIAGNOSIS — R002 Palpitations: Secondary | ICD-10-CM | POA: Diagnosis not present

## 2020-09-14 LAB — CMP14+EGFR
ALT: 17 IU/L (ref 0–44)
AST: 19 IU/L (ref 0–40)
Albumin/Globulin Ratio: 1.8 (ref 1.2–2.2)
Albumin: 4.6 g/dL (ref 3.7–4.7)
Alkaline Phosphatase: 75 IU/L (ref 44–121)
BUN/Creatinine Ratio: 16 (ref 10–24)
BUN: 14 mg/dL (ref 8–27)
Bilirubin Total: 0.5 mg/dL (ref 0.0–1.2)
CO2: 22 mmol/L (ref 20–29)
Calcium: 9.2 mg/dL (ref 8.6–10.2)
Chloride: 102 mmol/L (ref 96–106)
Creatinine, Ser: 0.86 mg/dL (ref 0.76–1.27)
Globulin, Total: 2.6 g/dL (ref 1.5–4.5)
Glucose: 115 mg/dL — ABNORMAL HIGH (ref 65–99)
Potassium: 4.5 mmol/L (ref 3.5–5.2)
Sodium: 138 mmol/L (ref 134–144)
Total Protein: 7.2 g/dL (ref 6.0–8.5)
eGFR: 93 mL/min/{1.73_m2} (ref >59)

## 2020-09-14 LAB — LIPID PANEL
Chol/HDL Ratio: 2.3 ratio (ref 0.0–5.0)
Cholesterol, Total: 135 mg/dL (ref 100–199)
HDL: 60 mg/dL (ref >39)
LDL Chol Calc (NIH): 60 mg/dL (ref 0–99)
Triglycerides: 79 mg/dL (ref 0–149)
VLDL Cholesterol Cal: 15 mg/dL (ref 5–40)

## 2020-09-14 LAB — THYROID PANEL WITH TSH
Free Thyroxine Index: 1.6 (ref 1.2–4.9)
T3 Uptake Ratio: 25 % (ref 24–39)
T4, Total: 6.2 ug/dL (ref 4.5–12.0)
TSH: 3.41 u[IU]/mL (ref 0.450–4.500)

## 2020-09-14 LAB — PSA: Prostate Specific Ag, Serum: 0.8 ng/mL (ref 0.0–4.0)

## 2020-09-14 LAB — HEPATIC FUNCTION PANEL: Bilirubin, Direct: 0.17 mg/dL (ref 0.00–0.40)

## 2020-09-14 LAB — MAGNESIUM: Magnesium: 2.1 mg/dL (ref 1.6–2.3)

## 2020-09-14 NOTE — Progress Notes (Signed)
Pt called and aware by VM and aware to check mychart

## 2020-09-15 DIAGNOSIS — M19072 Primary osteoarthritis, left ankle and foot: Secondary | ICD-10-CM | POA: Diagnosis not present

## 2020-09-22 DIAGNOSIS — H35363 Drusen (degenerative) of macula, bilateral: Secondary | ICD-10-CM | POA: Diagnosis not present

## 2020-09-22 DIAGNOSIS — Z0100 Encounter for examination of eyes and vision without abnormal findings: Secondary | ICD-10-CM | POA: Diagnosis not present

## 2020-09-22 DIAGNOSIS — E7800 Pure hypercholesterolemia, unspecified: Secondary | ICD-10-CM | POA: Diagnosis not present

## 2020-09-22 DIAGNOSIS — H52229 Regular astigmatism, unspecified eye: Secondary | ICD-10-CM | POA: Diagnosis not present

## 2020-09-28 DIAGNOSIS — G4733 Obstructive sleep apnea (adult) (pediatric): Secondary | ICD-10-CM | POA: Diagnosis not present

## 2020-10-07 DIAGNOSIS — H43823 Vitreomacular adhesion, bilateral: Secondary | ICD-10-CM | POA: Diagnosis not present

## 2020-10-07 DIAGNOSIS — H3540 Unspecified peripheral retinal degeneration: Secondary | ICD-10-CM | POA: Diagnosis not present

## 2020-10-07 DIAGNOSIS — H35033 Hypertensive retinopathy, bilateral: Secondary | ICD-10-CM | POA: Diagnosis not present

## 2020-10-07 DIAGNOSIS — H3561 Retinal hemorrhage, right eye: Secondary | ICD-10-CM | POA: Diagnosis not present

## 2020-10-29 DIAGNOSIS — G4733 Obstructive sleep apnea (adult) (pediatric): Secondary | ICD-10-CM | POA: Diagnosis not present

## 2020-11-09 DIAGNOSIS — L821 Other seborrheic keratosis: Secondary | ICD-10-CM | POA: Diagnosis not present

## 2020-11-09 DIAGNOSIS — L812 Freckles: Secondary | ICD-10-CM | POA: Diagnosis not present

## 2020-11-09 DIAGNOSIS — L814 Other melanin hyperpigmentation: Secondary | ICD-10-CM | POA: Diagnosis not present

## 2020-11-09 DIAGNOSIS — L570 Actinic keratosis: Secondary | ICD-10-CM | POA: Diagnosis not present

## 2020-11-09 DIAGNOSIS — D485 Neoplasm of uncertain behavior of skin: Secondary | ICD-10-CM | POA: Diagnosis not present

## 2020-11-09 DIAGNOSIS — Z85828 Personal history of other malignant neoplasm of skin: Secondary | ICD-10-CM | POA: Diagnosis not present

## 2020-11-09 DIAGNOSIS — L918 Other hypertrophic disorders of the skin: Secondary | ICD-10-CM | POA: Diagnosis not present

## 2020-11-09 DIAGNOSIS — L820 Inflamed seborrheic keratosis: Secondary | ICD-10-CM | POA: Diagnosis not present

## 2020-11-22 DIAGNOSIS — H35033 Hypertensive retinopathy, bilateral: Secondary | ICD-10-CM | POA: Diagnosis not present

## 2020-11-22 DIAGNOSIS — H43823 Vitreomacular adhesion, bilateral: Secondary | ICD-10-CM | POA: Diagnosis not present

## 2020-11-22 DIAGNOSIS — H3540 Unspecified peripheral retinal degeneration: Secondary | ICD-10-CM | POA: Diagnosis not present

## 2020-11-22 DIAGNOSIS — H3561 Retinal hemorrhage, right eye: Secondary | ICD-10-CM | POA: Diagnosis not present

## 2020-11-28 DIAGNOSIS — G4733 Obstructive sleep apnea (adult) (pediatric): Secondary | ICD-10-CM | POA: Diagnosis not present

## 2020-12-02 DIAGNOSIS — M19079 Primary osteoarthritis, unspecified ankle and foot: Secondary | ICD-10-CM | POA: Diagnosis not present

## 2020-12-02 DIAGNOSIS — M19072 Primary osteoarthritis, left ankle and foot: Secondary | ICD-10-CM | POA: Diagnosis not present

## 2020-12-02 DIAGNOSIS — G8929 Other chronic pain: Secondary | ICD-10-CM | POA: Diagnosis not present

## 2020-12-02 DIAGNOSIS — M25572 Pain in left ankle and joints of left foot: Secondary | ICD-10-CM | POA: Diagnosis not present

## 2020-12-02 DIAGNOSIS — M79672 Pain in left foot: Secondary | ICD-10-CM | POA: Diagnosis not present

## 2020-12-02 DIAGNOSIS — M19071 Primary osteoarthritis, right ankle and foot: Secondary | ICD-10-CM | POA: Diagnosis not present

## 2020-12-06 ENCOUNTER — Ambulatory Visit (INDEPENDENT_AMBULATORY_CARE_PROVIDER_SITE_OTHER): Payer: Medicare HMO | Admitting: Family Medicine

## 2020-12-06 ENCOUNTER — Encounter: Payer: Self-pay | Admitting: Family Medicine

## 2020-12-06 ENCOUNTER — Other Ambulatory Visit: Payer: Self-pay

## 2020-12-06 VITALS — BP 126/73 | HR 92 | Temp 97.5°F | Ht 67.0 in | Wt 204.8 lb

## 2020-12-06 DIAGNOSIS — E039 Hypothyroidism, unspecified: Secondary | ICD-10-CM

## 2020-12-06 DIAGNOSIS — Z23 Encounter for immunization: Secondary | ICD-10-CM | POA: Diagnosis not present

## 2020-12-06 DIAGNOSIS — K5909 Other constipation: Secondary | ICD-10-CM

## 2020-12-06 DIAGNOSIS — I10 Essential (primary) hypertension: Secondary | ICD-10-CM

## 2020-12-06 MED ORDER — LINACLOTIDE 290 MCG PO CAPS: 290.0000 ug | ORAL_CAPSULE | Freq: Every day | ORAL | 0 refills | Status: DC

## 2020-12-06 NOTE — Progress Notes (Signed)
Subjective: CC: hypothyroidism, htn PCP: Janora Norlander, DO Daniel Macias is a 70 y.o. male presenting to clinic today for:  1. Hypothyroidism Came in august and had labs done.  These were normal.  He did have an episode of constipation that was severe and lasted several days before being relieved by an enema.  He otherwise has remained asymptomatic.  No change in energy, weight, tremors, heart palpitations etc.  2. HTN Has been off of blood pressure medication and is doing well.  No chest pain, shortness of breath or dizziness reported   ROS: Per HPI  Allergies  Allergen Reactions   Morphine And Related Itching   Lisinopril Cough   Past Medical History:  Diagnosis Date   Allergy    Carpal tunnel syndrome, bilateral    Hx of colonic polyps    Hyperlipidemia    Hypertension    Lower leg fracture    Sleep apnea    CPAP   Vitamin D deficiency     Current Outpatient Medications:    aspirin EC 81 MG tablet, Take 81 mg by mouth daily., Disp: , Rfl:    azelastine (ASTELIN) 0.1 % nasal spray, Place 2 sprays into both nostrils at bedtime. Use in each nostril as directed, Disp: 30 mL, Rfl: 2   Ergocalciferol (VITAMIN D2) 10 MCG (400 UNIT) TABS, , Disp: , Rfl:    fluticasone (FLONASE) 50 MCG/ACT nasal spray, Place 2 sprays into both nostrils daily as needed., Disp: 48 mL, Rfl: 1   levothyroxine (SYNTHROID) 25 MCG tablet, Take 1 tablet (25 mcg total) by mouth daily before breakfast., Disp: 90 tablet, Rfl: 3   Omega-3 Fatty Acids (FISH OIL) 1000 MG CAPS, , Disp: , Rfl:    rosuvastatin (CRESTOR) 10 MG tablet, Take 1 tablet (10 mg total) by mouth daily., Disp: 90 tablet, Rfl: 1 Social History   Socioeconomic History   Marital status: Married    Spouse name: Daniel Macias   Number of children: 2   Years of education: Not on file   Highest education level: Not on file  Occupational History   Occupation: retired     Comment: Architect   Tobacco Use   Smoking status: Never    Smokeless tobacco: Never  Vaping Use   Vaping Use: Never used  Substance and Sexual Activity   Alcohol use: Yes    Comment: occasional   Drug use: No   Sexual activity: Yes  Other Topics Concern   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 son is Dr Wynetta Emery, optometrist at [facility] in [place] Strain: Low Risk    Difficulty of Paying Living Expenses: Not hard at all  Food Insecurity: No Food Insecurity   Worried About Charity fundraiser in the Last Year: Never true   Arboriculturist in the Last Year: Never true  Transportation Needs: No Transportation Needs   Lack of Transportation (Medical): No   Lack of Transportation (Non-Medical): No  Physical Activity: Not on file  Stress: No Stress Concern Present   Feeling of Stress : Not at all  Social Connections: Socially Integrated   Frequency of Communication with Friends and Family: More than three times a week   Frequency of Social Gatherings with Friends and Family: More than three times a week   Attends Religious Services: More than 4 times per year   Active Member of [employer]  or Organizations: Yes   Attends Music therapist: More than 4 times per year   Marital Status: Married  Human resources officer Violence: Not At Risk   Fear of Current or Ex-Partner: No   Emotionally Abused: No   Physically Abused: No   Sexually Abused: No   Family History  Problem Relation Age of Onset   Hypertension Mother    Cancer Mother        uterine -METS to lungs   Diabetes Mother    Emphysema Father    Heart disease Father    Heart attack Brother 58       x 2    Heart disease Brother    Hypertension Brother    Diabetes Brother    Crohn's disease Son    Heart attack Paternal Grandfather     Objective: Office vital signs reviewed. BP 126/73   Pulse 92   Temp (!) 97.5 F (36.4 C)   Ht 5\' 7"  (1.702 m)   Wt 204 lb 12.8 oz (92.9 kg)   SpO2 96%    BMI 32.08 kg/m   Physical Examination:  General: Awake, alert, well nourished, No acute distress HEENT: Normal; Notes of thalmus.  No goiter Cardio: regular rate and rhythm, S1S2 heard, no murmurs appreciated Pulm: clear to auscultation bilaterally, no wheezes, rhonchi or rales; normal work of breathing on room air GI: Protuberant but soft, nontender, nondistended Extremities: warm, well perfused, No edema, cyanosis or clubbing; +2 pulses bilaterally MSK: normal gait and station   Assessment/ Plan: 70 y.o. male   Hypothyroidism (acquired)  Well-controlled hypertension  Other constipation  Need for immunization against influenza - Plan: Flu Vaccine QUAD High Dose(Fluad)  Asymptomatic from a thyroid standpoint and thyroid levels are normal.  Blood pressure is diet controlled and he looks great.  No changes needed  Samples of Linzess provided should he develop constipation again.  Influenza vac administered  Orders Placed This Encounter  Procedures   Flu Vaccine QUAD High Dose(Fluad)   Meds ordered this encounter  Medications   linaclotide (LINZESS) 290 MCG CAPS capsule    Sig: Take 1 capsule (290 mcg total) by mouth daily before breakfast.    Dispense:  8 capsule    Refill:  0     Tayshawn Purnell Windell Moulding, DO [place] [phone]

## 2020-12-09 ENCOUNTER — Other Ambulatory Visit: Payer: Self-pay | Admitting: Family Medicine

## 2020-12-29 DIAGNOSIS — G4733 Obstructive sleep apnea (adult) (pediatric): Secondary | ICD-10-CM | POA: Diagnosis not present

## 2021-01-17 ENCOUNTER — Other Ambulatory Visit: Payer: Medicare HMO

## 2021-01-17 ENCOUNTER — Other Ambulatory Visit: Payer: Self-pay | Admitting: Family Medicine

## 2021-01-17 DIAGNOSIS — Z01818 Encounter for other preprocedural examination: Secondary | ICD-10-CM

## 2021-01-18 LAB — NOVEL CORONAVIRUS, NAA: SARS-CoV-2, NAA: NOT DETECTED

## 2021-01-20 DIAGNOSIS — E785 Hyperlipidemia, unspecified: Secondary | ICD-10-CM | POA: Diagnosis not present

## 2021-01-20 DIAGNOSIS — G8929 Other chronic pain: Secondary | ICD-10-CM | POA: Diagnosis not present

## 2021-01-20 DIAGNOSIS — G4733 Obstructive sleep apnea (adult) (pediatric): Secondary | ICD-10-CM | POA: Diagnosis not present

## 2021-01-20 DIAGNOSIS — M19172 Post-traumatic osteoarthritis, left ankle and foot: Secondary | ICD-10-CM | POA: Diagnosis not present

## 2021-01-20 DIAGNOSIS — I10 Essential (primary) hypertension: Secondary | ICD-10-CM | POA: Diagnosis not present

## 2021-01-20 DIAGNOSIS — Z888 Allergy status to other drugs, medicaments and biological substances status: Secondary | ICD-10-CM | POA: Diagnosis not present

## 2021-01-20 DIAGNOSIS — M6702 Short Achilles tendon (acquired), left ankle: Secondary | ICD-10-CM | POA: Diagnosis not present

## 2021-01-20 DIAGNOSIS — M19072 Primary osteoarthritis, left ankle and foot: Secondary | ICD-10-CM | POA: Diagnosis not present

## 2021-01-20 DIAGNOSIS — G8918 Other acute postprocedural pain: Secondary | ICD-10-CM | POA: Diagnosis not present

## 2021-01-20 DIAGNOSIS — M25572 Pain in left ankle and joints of left foot: Secondary | ICD-10-CM | POA: Diagnosis not present

## 2021-01-20 DIAGNOSIS — Z96662 Presence of left artificial ankle joint: Secondary | ICD-10-CM | POA: Diagnosis not present

## 2021-01-20 DIAGNOSIS — E039 Hypothyroidism, unspecified: Secondary | ICD-10-CM | POA: Diagnosis not present

## 2021-01-21 DIAGNOSIS — M19079 Primary osteoarthritis, unspecified ankle and foot: Secondary | ICD-10-CM | POA: Diagnosis not present

## 2021-01-21 DIAGNOSIS — M25572 Pain in left ankle and joints of left foot: Secondary | ICD-10-CM | POA: Diagnosis not present

## 2021-01-21 DIAGNOSIS — G8918 Other acute postprocedural pain: Secondary | ICD-10-CM | POA: Diagnosis not present

## 2021-01-26 DIAGNOSIS — Z809 Family history of malignant neoplasm, unspecified: Secondary | ICD-10-CM | POA: Diagnosis not present

## 2021-01-26 DIAGNOSIS — E669 Obesity, unspecified: Secondary | ICD-10-CM | POA: Diagnosis not present

## 2021-01-26 DIAGNOSIS — E785 Hyperlipidemia, unspecified: Secondary | ICD-10-CM | POA: Diagnosis not present

## 2021-01-26 DIAGNOSIS — J309 Allergic rhinitis, unspecified: Secondary | ICD-10-CM | POA: Diagnosis not present

## 2021-01-26 DIAGNOSIS — E039 Hypothyroidism, unspecified: Secondary | ICD-10-CM | POA: Diagnosis not present

## 2021-01-26 DIAGNOSIS — Z7982 Long term (current) use of aspirin: Secondary | ICD-10-CM | POA: Diagnosis not present

## 2021-01-26 DIAGNOSIS — Z885 Allergy status to narcotic agent status: Secondary | ICD-10-CM | POA: Diagnosis not present

## 2021-01-28 DIAGNOSIS — G4733 Obstructive sleep apnea (adult) (pediatric): Secondary | ICD-10-CM | POA: Diagnosis not present

## 2021-02-16 DIAGNOSIS — Z96662 Presence of left artificial ankle joint: Secondary | ICD-10-CM | POA: Diagnosis not present

## 2021-02-20 ENCOUNTER — Ambulatory Visit: Payer: Medicare HMO | Admitting: Nurse Practitioner

## 2021-02-20 DIAGNOSIS — M10031 Idiopathic gout, right wrist: Secondary | ICD-10-CM | POA: Diagnosis not present

## 2021-02-20 DIAGNOSIS — M25531 Pain in right wrist: Secondary | ICD-10-CM | POA: Diagnosis not present

## 2021-02-21 ENCOUNTER — Other Ambulatory Visit: Payer: Self-pay

## 2021-02-21 ENCOUNTER — Ambulatory Visit: Payer: Medicare HMO | Attending: Orthopedic Surgery | Admitting: Physical Therapy

## 2021-02-21 ENCOUNTER — Encounter: Payer: Self-pay | Admitting: Physical Therapy

## 2021-02-21 DIAGNOSIS — M25672 Stiffness of left ankle, not elsewhere classified: Secondary | ICD-10-CM | POA: Insufficient documentation

## 2021-02-21 DIAGNOSIS — R600 Localized edema: Secondary | ICD-10-CM | POA: Insufficient documentation

## 2021-02-21 DIAGNOSIS — M25572 Pain in left ankle and joints of left foot: Secondary | ICD-10-CM | POA: Insufficient documentation

## 2021-02-21 NOTE — Therapy (Signed)
[facility] [place], [place], [address] Phone: [phone]   Fax:  [phone]  Physical Therapy Evaluation  Patient Details  Name: Daniel Macias MRN: [id number] Date of Birth: 10/11/1950 Referring Provider (PT): Rickey Primus MD   Encounter Date: 02/21/2021   PT End of Session - 02/21/21 1615     Visit Number 1    Number of Visits 12    Date for PT Re-Evaluation 05/22/21    Authorization Type FOTO AT LEAST EVERY 5TH VISIT.  PROGRESS NOTE AT 10TH VISIT.  KX MODIFIER AFTER 15 VISITS.    PT Start Time 0326    PT Stop Time 0410    PT Time Calculation (min) 44 min    Activity Tolerance Patient tolerated treatment well    Behavior During Therapy WFL for tasks assessed/performed             Past Medical History:  Diagnosis Date   Allergy    Carpal tunnel syndrome, bilateral    Hx of colonic polyps    Hyperlipidemia    Hypertension    Lower leg fracture    Sleep apnea    CPAP   Vitamin D deficiency     Past Surgical History:  Procedure Laterality Date   CYST REMOVAL HAND Right    wrist    LEG SURGERY Left [place]   DDD    There were no vitals filed for this visit.    Subjective Assessment - 02/21/21 1554     Subjective COVID-19 screen performed prior to patient entering clinic.  The patient presents to the clinic today s/p left total ankle replacement performed on 01/20/21.  He presented ambulating with one axillary crutch and a CAM boot.  He reports no pain.  He gets out of the boot at night an dis wiggling his toes.  The top of his left foot is still numb but improving.    Pertinent History Bilateral CTS, HTN, back surgery, left ankle fracture (1971).    How long can you walk comfortably? Around home and farm with boot.  Has walked short distance in home without boot.    Patient Stated Goals Get around without left ankle hurting.    Currently in Pain? No/denies                 [facility] PT Assessment - 02/21/21 0001       Assessment   Medical Diagnosis Right total ankle replacement.    Referring Provider (PT) Rickey Primus MD    Onset Date/Surgical Date 01/20/21      Precautions   Precaution Comments Please progress per protocol.  Under Media and protocol book.      Restrictions   Other Position/Activity Restrictions PWB in left CAM boot.      Balance Screen   Has the patient fallen in the past 6 months No    Has the patient had a decrease in activity level because of a fear of falling?  No    Is the patient reluctant to leave their home because of a fear of falling?  No      Home Environment   Living Environment Private residence      Prior Function   Level of Independence Independent      Observation/Other Assessments   Focus on Therapeutic Outcomes (FOTO)  Complete.      Observation/Other Assessments-Edema    Edema Circumferential      Circumferential Edema  Circumferential - Left  Left (Bi-malleolar) 1 cm > right.      ROM / Strength   AROM / PROM / Strength AROM      AROM   Overall AROM Comments Active left ankle dorsiflexion to -5 degree and plantarflexion to 15 degrees.      Palpation   Palpation comment Mild tenderness on either side of the left ankle incisional site (dorsum of foot).      Ambulation/Gait   Gait Comments Patient walking with left CAM boot donned and on axillary crutch.                        Objective measurements completed on examination: See above findings.       [place] [facility] PT Treatment/Exercise - 02/21/21 0001       Modalities   Modalities Vasopneumatic      Vasopneumatic   Number Minutes Vasopneumatic  15 minutes    Vasopnuematic Location  --   Left ankle.   Vasopneumatic Pressure Low                     PT Education - 02/21/21 1748     Education Details Active left ankle dorsi and plantarflexion    Person(s) Educated Patient    Methods  Explanation;Demonstration    Comprehension Verbalized understanding;Returned demonstration                 PT Long Term Goals - 02/21/21 1759       PT LONG TERM GOAL #1   Title Patient will be independent with HEP    Time 6    Status New      PT LONG TERM GOAL #2   Title Increase active left ankle dorsiflexion to 6- 8 degrees to normalize the patients gait pattern.    Time 6    Period Weeks    Status New      PT LONG TERM GOAL #3   Title Increase left ankle strength to 4+ to 5/5 to increase stability for functional tasks.    Time 6    Period Weeks    Status New      PT LONG TERM GOAL #4   Title Walk 250 feet without assistive device and no deviation.    Time 6    Period Weeks    Status New      PT LONG TERM GOAL #5   Title Perform ADL's with left ankle pain not > 3/10.    Time 6    Period Weeks    Status New                    Plan - 02/21/21 1730     Clinical Impression Statement The patient presents to OPPT s/p left total ankle replacement performed on 01/20/21.  He is PWBing using one axillary crutch and a CAM boot donned.  He has a loss of left ankle dorsi and plantarflexion as expected and some edema. His FOTO limitation score demonstrated a 67% limitation.  Patient will benefit from skilled physical therapy intervention to address pain and deficits.    Personal Factors and Comorbidities Comorbidity 1;Other    Comorbidities Bilateral CTS, HTN, back surgery, left ankle fracture (1971).    Examination-Activity Limitations Other;Locomotion Level    Examination-Participation Restrictions Other    Stability/Clinical Decision Making Stable/Uncomplicated    Clinical Decision Making Low    Rehab Potential Excellent    PT  Frequency 2x / week    PT Duration 6 weeks    PT Treatment/Interventions ADLs/Self Care Home Management;Cryotherapy;Electrical Stimulation;Moist Heat;Gait training;Stair training;Functional mobility training;Therapeutic  activities;Therapeutic exercise;Neuromuscular re-education;Manual techniques;Patient/family education;Passive range of motion    PT Next Visit Plan Please progress per protocol (under Media tab and protocol book).  Vasopneumatic.    PT Home Exercise Plan Active left ankle dorsi and plantarflexion.    Consulted and Agree with Plan of Care Patient             Patient will benefit from skilled therapeutic intervention in order to improve the following deficits and impairments:  Abnormal gait, Decreased activity tolerance, Increased edema, Pain, Decreased range of motion  Visit Diagnosis: Pain in left ankle and joints of left foot - Plan: PT plan of care cert/re-cert  Stiffness of left ankle, not elsewhere classified - Plan: PT plan of care cert/re-cert  Localized edema - Plan: PT plan of care cert/re-cert     Problem List Patient Active Problem List   Diagnosis Date Noted   Short Achilles tendon, left 07/24/2019   Chronic pain of left ankle 07/24/2019   Arthritis of ankle, left 07/24/2019   Whiplash injury to neck 12/31/2018   Cervical spondylosis with radiculopathy 12/25/2018   Biceps tendon rupture, right, initial encounter 12/25/2018   Hypothyroidism (acquired) 12/01/2017   Palpitation 09/30/2013   Allergic rhinitis 05/27/2013   Hypertension 16/24/4695   Metabolic syndrome 09/02/5748   BPH (benign prostatic hyperplasia) 11/25/2012   Obstructive sleep apnea 11/25/2012   Hyperlipemia 05/28/2012   Vitamin D deficiency disease 05/28/2012    Brayln Duque, [place], PT 02/21/2021, 6:10 PM  [facility] [address] [place], [place], [address] Phone: [phone]   Fax:  [phone]  Name: AVINASH MALTOS MRN: [id number] Date of Birth: 09-29-1950

## 2021-02-24 ENCOUNTER — Ambulatory Visit: Payer: Medicare HMO | Admitting: Physical Therapy

## 2021-02-24 ENCOUNTER — Other Ambulatory Visit: Payer: Self-pay

## 2021-02-24 ENCOUNTER — Encounter: Payer: Self-pay | Admitting: Physical Therapy

## 2021-02-24 DIAGNOSIS — M25672 Stiffness of left ankle, not elsewhere classified: Secondary | ICD-10-CM | POA: Diagnosis not present

## 2021-02-24 DIAGNOSIS — M25572 Pain in left ankle and joints of left foot: Secondary | ICD-10-CM | POA: Diagnosis not present

## 2021-02-24 DIAGNOSIS — R600 Localized edema: Secondary | ICD-10-CM | POA: Diagnosis not present

## 2021-02-24 NOTE — Therapy (Signed)
[facility] [place], [place], [address] Phone: [phone]   Fax:  [phone]  Physical Therapy Treatment  Patient Details  Name: Daniel Macias MRN: [id number] Date of Birth: Jan 08, 1951 Referring Provider (PT): Rickey Primus MD   Encounter Date: 02/24/2021   PT End of Session - 02/24/21 0949     Visit Number 2    Number of Visits 12    Date for PT Re-Evaluation 05/22/21    Authorization Type FOTO AT LEAST EVERY 5TH VISIT.  PROGRESS NOTE AT 10TH VISIT.  KX MODIFIER AFTER 15 VISITS.    PT Start Time [phone]    PT Stop Time 1028    PT Time Calculation (min) 41 min    Equipment Utilized During Treatment Other (comment)   unilateral axillary crutch, CAM boot   Activity Tolerance Patient tolerated treatment well    Behavior During Therapy WFL for tasks assessed/performed             Past Medical History:  Diagnosis Date   Allergy    Carpal tunnel syndrome, bilateral    Hx of colonic polyps    Hyperlipidemia    Hypertension    Lower leg fracture    Sleep apnea    CPAP   Vitamin D deficiency     Past Surgical History:  Procedure Laterality Date   CYST REMOVAL HAND Right    wrist    LEG SURGERY Left [place]   DDD    There were no vitals filed for this visit.   Subjective Assessment - 02/24/21 0945     Subjective COVID-19 screen performed prior to patient entering clinic. No complaints other than swelling after short sitting.    Pertinent History Bilateral CTS, HTN, back surgery, left ankle fracture (1971).    How long can you walk comfortably? Around home and farm with boot.  Has walked short distance in home without boot.    Patient Stated Goals Get around without left ankle hurting.    Currently in Pain? No/denies                [facility] PT Assessment - 02/24/21 0001       Assessment   Medical Diagnosis Right total ankle replacement.    Referring Provider (PT) Rickey Primus  MD    Onset Date/Surgical Date 01/20/21    Hand Dominance Right    Next MD Visit 03/16/2021      Precautions   Precaution Comments Please progress per protocol.  Under Media and protocol book.                           [place] Adult PT Treatment/Exercise - 02/24/21 0001       Exercises   Exercises Ankle      Modalities   Modalities Vasopneumatic      Vasopneumatic   Number Minutes Vasopneumatic  10 minutes    Vasopnuematic Location  Ankle    Vasopneumatic Pressure Low    Vasopneumatic Temperature  34      Ankle Exercises: Aerobic   Nustep L2, seat 11 x14 min   with boot donned     Ankle Exercises: Seated   Towel Crunch Limitations   x68min   Other Seated Ankle Exercises Rockerboard Df/Pf x5 min    Other Seated Ankle Exercises Dynadisc Df/PF, Inv/Ev, circles x3 min each  PT Long Term Goals - 02/24/21 0951       PT LONG TERM GOAL #1   Title Patient will be independent with HEP    Time 6    Status On-going      PT LONG TERM GOAL #2   Title Increase active left ankle dorsiflexion to 6- 8 degrees to normalize the patients gait pattern.    Time 6    Period Weeks    Status On-going      PT LONG TERM GOAL #3   Title Increase left ankle strength to 4+ to 5/5 to increase stability for functional tasks.    Time 6    Period Weeks    Status On-going      PT LONG TERM GOAL #4   Title Walk 250 feet without assistive device and no deviation.    Time 6    Period Weeks    Status On-going      PT LONG TERM GOAL #5   Title Perform ADL's with left ankle pain not > 3/10.    Time 6    Period Weeks    Status On-going                   Plan - 02/24/21 1027     Clinical Impression Statement Patient presented in clinic with reports with no pain. Patient had CAM boot donned and use of unilateral axillary crutch. Patient states that he has just been released from constant wear of the CAM boot and can sleep without  the boot. Patient reports deficient sensation of the L foot which was advised for sensation training using a pillowcase or hand towel. Patient guided through seated L ankle ROM exercises which were all fairly limited by the stiffness of the L ankle. No reports of pain during AROM therex. Normal vasopneumatic response noted following removal of the modality.    Personal Factors and Comorbidities Comorbidity 1;Other    Comorbidities Bilateral CTS, HTN, back surgery, left ankle fracture (1971).    Examination-Activity Limitations Other;Locomotion Level    Examination-Participation Restrictions Other    Stability/Clinical Decision Making Stable/Uncomplicated    Rehab Potential Excellent    PT Frequency 2x / week    PT Duration 6 weeks    PT Treatment/Interventions ADLs/Self Care Home Management;Cryotherapy;Electrical Stimulation;Moist Heat;Gait training;Stair training;Functional mobility training;Therapeutic activities;Therapeutic exercise;Neuromuscular re-education;Manual techniques;Patient/family education;Passive range of motion    PT Next Visit Plan Please progress per protocol (under Media tab and protocol book).  Vasopneumatic.    PT Home Exercise Plan Active left ankle dorsi and plantarflexion.    Consulted and Agree with Plan of Care Patient             Patient will benefit from skilled therapeutic intervention in order to improve the following deficits and impairments:  Abnormal gait, Decreased activity tolerance, Increased edema, Pain, Decreased range of motion  Visit Diagnosis: Pain in left ankle and joints of left foot  Stiffness of left ankle, not elsewhere classified  Localized edema     Problem List Patient Active Problem List   Diagnosis Date Noted   Short Achilles tendon, left 07/24/2019   Chronic pain of left ankle 07/24/2019   Arthritis of ankle, left 07/24/2019   Whiplash injury to neck 12/31/2018   Cervical spondylosis with radiculopathy 12/25/2018   Biceps  tendon rupture, right, initial encounter 12/25/2018   Hypothyroidism (acquired) 12/01/2017   Palpitation 09/30/2013   Allergic rhinitis 05/27/2013   Hypertension 28/76/8115   Metabolic syndrome 72/62/0355  BPH (benign prostatic hyperplasia) 11/25/2012   Obstructive sleep apnea 11/25/2012   Hyperlipemia 05/28/2012   Vitamin D deficiency disease 05/28/2012    Standley Brooking, PTA 02/24/2021, 12:33 PM  [facility] [place], [place], [address] Phone: [phone]   Fax:  [phone]  Name: Daniel Macias MRN: [id number] Date of Birth: Nov 19, 1950

## 2021-02-28 ENCOUNTER — Encounter: Payer: Self-pay | Admitting: Physical Therapy

## 2021-02-28 ENCOUNTER — Other Ambulatory Visit: Payer: Self-pay

## 2021-02-28 ENCOUNTER — Ambulatory Visit: Payer: Medicare HMO | Admitting: Physical Therapy

## 2021-02-28 DIAGNOSIS — M25572 Pain in left ankle and joints of left foot: Secondary | ICD-10-CM | POA: Diagnosis not present

## 2021-02-28 DIAGNOSIS — R600 Localized edema: Secondary | ICD-10-CM | POA: Diagnosis not present

## 2021-02-28 DIAGNOSIS — G4733 Obstructive sleep apnea (adult) (pediatric): Secondary | ICD-10-CM | POA: Diagnosis not present

## 2021-02-28 DIAGNOSIS — M25672 Stiffness of left ankle, not elsewhere classified: Secondary | ICD-10-CM | POA: Diagnosis not present

## 2021-02-28 NOTE — Therapy (Signed)
[facility] [place], [place], [address] Phone: [phone]   Fax:  [phone]  Physical Therapy Treatment  Patient Details  Name: Daniel Macias MRN: [id number] Date of Birth: January 17, 1951 Referring Provider (PT): Rickey Primus MD   Encounter Date: 02/28/2021   PT End of Session - 02/28/21 0949     Visit Number 3    Number of Visits 12    Date for PT Re-Evaluation 05/22/21    Authorization Type FOTO AT LEAST EVERY 5TH VISIT.  PROGRESS NOTE AT 10TH VISIT.  KX MODIFIER AFTER 15 VISITS.    PT Start Time [phone]    PT Stop Time 1032    PT Time Calculation (min) 45 min    Equipment Utilized During Treatment Other (comment)   CAM boot   Activity Tolerance Patient tolerated treatment well    Behavior During Therapy WFL for tasks assessed/performed             Past Medical History:  Diagnosis Date   Allergy    Carpal tunnel syndrome, bilateral    Hx of colonic polyps    Hyperlipidemia    Hypertension    Lower leg fracture    Sleep apnea    CPAP   Vitamin D deficiency     Past Surgical History:  Procedure Laterality Date   CYST REMOVAL HAND Right    wrist    LEG SURGERY Left [place]   DDD    There were no vitals filed for this visit.   Subjective Assessment - 02/28/21 0948     Subjective COVID-19 screen performed prior to patient entering clinic. Was up on his feet a lot yesterday as his wife had an appointment. Has also been busy this morning.    Pertinent History Bilateral CTS, HTN, back surgery, left ankle fracture (1971).    How long can you walk comfortably? Around home and farm with boot.  Has walked short distance in home without boot.    Patient Stated Goals Get around without left ankle hurting.    Currently in Pain? No/denies                [facility] PT Assessment - 02/28/21 0001       Assessment   Medical Diagnosis Right total ankle replacement.    Referring Provider  (PT) Rickey Primus MD    Onset Date/Surgical Date 01/20/21    Hand Dominance Right    Next MD Visit 03/16/2021      Precautions   Precaution Comments Please progress per protocol.  Under Media and protocol book.                           [place] Adult PT Treatment/Exercise - 02/28/21 0001       Modalities   Modalities Vasopneumatic      Vasopneumatic   Number Minutes Vasopneumatic  10 minutes    Vasopnuematic Location  Ankle    Vasopneumatic Pressure Low    Vasopneumatic Temperature  34      Ankle Exercises: Aerobic   Nustep L3 x18 min      Ankle Exercises: Seated   Heel Raises Left;20 reps    Toe Raise 20 reps    Heel Slides Left;10 reps    Other Seated Ankle Exercises Rockerboard Df/Pf x3 min, x3 min Inv/Ev    Other Seated Ankle Exercises Dynadisc circles x3 min  PT Long Term Goals - 02/24/21 0951       PT LONG TERM GOAL #1   Title Patient will be independent with HEP    Time 6    Status On-going      PT LONG TERM GOAL #2   Title Increase active left ankle dorsiflexion to 6- 8 degrees to normalize the patients gait pattern.    Time 6    Period Weeks    Status On-going      PT LONG TERM GOAL #3   Title Increase left ankle strength to 4+ to 5/5 to increase stability for functional tasks.    Time 6    Period Weeks    Status On-going      PT LONG TERM GOAL #4   Title Walk 250 feet without assistive device and no deviation.    Time 6    Period Weeks    Status On-going      PT LONG TERM GOAL #5   Title Perform ADL's with left ankle pain not > 3/10.    Time 6    Period Weeks    Status On-going                   Plan - 02/28/21 1038     Clinical Impression Statement Patient presented in clinic with more notable edema of the L forefoot and toes as he states he has been up and active more in the last few days. Patient states that at home he does elevate while inside. Patient progressed to more  active stretching and ROM exercises with no complaint of pain. Patient more limited with active DF. Normal vasopnuematic response noted following removal of the modality.    Personal Factors and Comorbidities Comorbidity 1;Other    Comorbidities Bilateral CTS, HTN, back surgery, left ankle fracture (1971).    Examination-Activity Limitations Other;Locomotion Level    Examination-Participation Restrictions Other    Stability/Clinical Decision Making Stable/Uncomplicated    Rehab Potential Excellent    PT Frequency 2x / week    PT Duration 6 weeks    PT Treatment/Interventions ADLs/Self Care Home Management;Cryotherapy;Electrical Stimulation;Moist Heat;Gait training;Stair training;Functional mobility training;Therapeutic activities;Therapeutic exercise;Neuromuscular re-education;Manual techniques;Patient/family education;Passive range of motion    PT Next Visit Plan Please progress per protocol (under Media tab and protocol book).  Vasopneumatic.    PT Home Exercise Plan Active left ankle dorsi and plantarflexion.    Consulted and Agree with Plan of Care Patient             Patient will benefit from skilled therapeutic intervention in order to improve the following deficits and impairments:  Abnormal gait, Decreased activity tolerance, Increased edema, Pain, Decreased range of motion  Visit Diagnosis: Pain in left ankle and joints of left foot  Stiffness of left ankle, not elsewhere classified  Localized edema     Problem List Patient Active Problem List   Diagnosis Date Noted   Short Achilles tendon, left 07/24/2019   Chronic pain of left ankle 07/24/2019   Arthritis of ankle, left 07/24/2019   Whiplash injury to neck 12/31/2018   Cervical spondylosis with radiculopathy 12/25/2018   Biceps tendon rupture, right, initial encounter 12/25/2018   Hypothyroidism (acquired) 12/01/2017   Palpitation 09/30/2013   Allergic rhinitis 05/27/2013   Hypertension 41/66/0630   Metabolic  syndrome 16/02/930   BPH (benign prostatic hyperplasia) 11/25/2012   Obstructive sleep apnea 11/25/2012   Hyperlipemia 05/28/2012   Vitamin D deficiency disease 05/28/2012    Merleen Nicely P  Laveda Abbe, PTA 02/28/2021, 11:11 AM  [facility] [place], [place], [address] Phone: [phone]   Fax:  [phone]  Name: Daniel Macias MRN: [id number] Date of Birth: 1950-07-28

## 2021-03-02 ENCOUNTER — Other Ambulatory Visit: Payer: Self-pay

## 2021-03-02 ENCOUNTER — Encounter: Payer: Self-pay | Admitting: Physical Therapy

## 2021-03-02 ENCOUNTER — Ambulatory Visit: Payer: Medicare HMO | Admitting: Physical Therapy

## 2021-03-02 DIAGNOSIS — M25572 Pain in left ankle and joints of left foot: Secondary | ICD-10-CM

## 2021-03-02 DIAGNOSIS — M25672 Stiffness of left ankle, not elsewhere classified: Secondary | ICD-10-CM | POA: Diagnosis not present

## 2021-03-02 DIAGNOSIS — R600 Localized edema: Secondary | ICD-10-CM | POA: Diagnosis not present

## 2021-03-02 NOTE — Therapy (Signed)
[facility] [place], [place], [address] Phone: [phone]   Fax:  [phone]  Physical Therapy Treatment  Patient Details  Name: Daniel Macias MRN: [id number] Date of Birth: [birth year] Referring Provider (PT): Rickey Primus MD   Encounter Date: 03/02/2021   PT End of Session - 03/02/21 0958     Visit Number 4    Number of Visits 12    Date for PT Re-Evaluation 05/22/21    Authorization Type FOTO AT LEAST EVERY 5TH VISIT.  PROGRESS NOTE AT 10TH VISIT.  KX MODIFIER AFTER 15 VISITS.    PT Start Time [phone]    PT Stop Time 1037    PT Time Calculation (min) 42 min    Equipment Utilized During Treatment Other (comment)   CAM boot, unilateral axillary crutch   Activity Tolerance Patient tolerated treatment well    Behavior During Therapy WFL for tasks assessed/performed             Past Medical History:  Diagnosis Date   Allergy    Carpal tunnel syndrome, bilateral    Hx of colonic polyps    Hyperlipidemia    Hypertension    Lower leg fracture    Sleep apnea    CPAP   Vitamin D deficiency     Past Surgical History:  Procedure Laterality Date   CYST REMOVAL HAND Right    wrist    LEG SURGERY Left [place]   DDD    There were no vitals filed for this visit.   Subjective Assessment - 03/02/21 0956     Subjective COVID-19 screen performed prior to patient entering clinic. Reports more pain in the original fracture site from [place] especially at night and reported as sharp.    Pertinent History Bilateral CTS, HTN, back surgery, left ankle fracture (1971).    How long can you walk comfortably? Around home and farm with boot.  Has walked short distance in home without boot.    Patient Stated Goals Get around without left ankle hurting.    Currently in Pain? Yes    Pain Score 1     Pain Location Ankle    Pain Orientation Left    Pain Descriptors / Indicators Discomfort    Pain Type  Surgical pain    Pain Onset In the past 7 days    Pain Frequency Intermittent                [facility] PT Assessment - 03/02/21 0001       Assessment   Medical Diagnosis Right total ankle replacement.    Referring Provider (PT) Rickey Primus MD    Onset Date/Surgical Date 01/20/21    Hand Dominance Right    Next MD Visit 03/16/2021      Precautions   Precaution Comments Please progress per protocol.  Under Media and protocol book.                           [place] Adult PT Treatment/Exercise - 03/02/21 0001       Modalities   Modalities Vasopneumatic      Vasopneumatic   Number Minutes Vasopneumatic  10 minutes    Vasopnuematic Location  Ankle    Vasopneumatic Pressure Low      Ankle Exercises: Aerobic   Nustep L3 x10 min      Ankle Exercises: Seated   Towel Crunch Limitations  x5 min   Heel Raises Left;20 reps;10 reps    Toe Raise 20 reps;10 reps    Heel Slides Left;15 reps    Other Seated Ankle Exercises Rockerboard Df/Pf x5 min,                          PT Long Term Goals - 02/24/21 0951       PT LONG TERM GOAL #1   Title Patient will be independent with HEP    Time 6    Status On-going      PT LONG TERM GOAL #2   Title Increase active left ankle dorsiflexion to 6- 8 degrees to normalize the patients gait pattern.    Time 6    Period Weeks    Status On-going      PT LONG TERM GOAL #3   Title Increase left ankle strength to 4+ to 5/5 to increase stability for functional tasks.    Time 6    Period Weeks    Status On-going      PT LONG TERM GOAL #4   Title Walk 250 feet without assistive device and no deviation.    Time 6    Period Weeks    Status On-going      PT LONG TERM GOAL #5   Title Perform ADL's with left ankle pain not > 3/10.    Time 6    Period Weeks    Status On-going                   Plan - 03/02/21 1339     Clinical Impression Statement Patient presented in clinic with reports of  some sharp pain in lateral ankle along distal fibula. Less inversion and eversion ROM exercises completed to avoid further pain and flare of symptoms. Patient noted he felt as if he had more ROM today with all exercises. Normal vasopneumatic response noted following removal of the modality.    Personal Factors and Comorbidities Comorbidity 1;Other    Comorbidities Bilateral CTS, HTN, back surgery, left ankle fracture (1971).    Examination-Activity Limitations Other;Locomotion Level    Examination-Participation Restrictions Other    Stability/Clinical Decision Making Stable/Uncomplicated    Rehab Potential Excellent    PT Frequency 2x / week    PT Duration 6 weeks    PT Treatment/Interventions ADLs/Self Care Home Management;Cryotherapy;Electrical Stimulation;Moist Heat;Gait training;Stair training;Functional mobility training;Therapeutic activities;Therapeutic exercise;Neuromuscular re-education;Manual techniques;Patient/family education;Passive range of motion    PT Next Visit Plan Please progress per protocol (under Media tab and protocol book).  Vasopneumatic.    PT Home Exercise Plan Active left ankle dorsi and plantarflexion.    Consulted and Agree with Plan of Care Patient             Patient will benefit from skilled therapeutic intervention in order to improve the following deficits and impairments:  Abnormal gait, Decreased activity tolerance, Increased edema, Pain, Decreased range of motion  Visit Diagnosis: Pain in left ankle and joints of left foot  Stiffness of left ankle, not elsewhere classified  Localized edema     Problem List Patient Active Problem List   Diagnosis Date Noted   Short Achilles tendon, left 07/24/2019   Chronic pain of left ankle 07/24/2019   Arthritis of ankle, left 07/24/2019   Whiplash injury to neck 12/31/2018   Cervical spondylosis with radiculopathy 12/25/2018   Biceps tendon rupture, right, initial encounter 12/25/2018   Hypothyroidism  (acquired) 12/01/2017  Palpitation 09/30/2013   Allergic rhinitis 05/27/2013   Hypertension 70/62/3762   Metabolic syndrome 83/15/1761   BPH (benign prostatic hyperplasia) 11/25/2012   Obstructive sleep apnea 11/25/2012   Hyperlipemia 05/28/2012   Vitamin D deficiency disease 05/28/2012    Standley Brooking, PTA 03/02/2021, 1:43 PM  [facility] [address] [place], [place], [address] Phone: [phone]   Fax:  [phone]  Name: FORNEY KLEINPETER MRN: [id number] Date of Birth: 1950-03-21

## 2021-03-07 ENCOUNTER — Ambulatory Visit: Payer: Medicare HMO | Admitting: Physical Therapy

## 2021-03-07 ENCOUNTER — Encounter: Payer: Self-pay | Admitting: Physical Therapy

## 2021-03-07 ENCOUNTER — Other Ambulatory Visit: Payer: Self-pay

## 2021-03-07 DIAGNOSIS — M25672 Stiffness of left ankle, not elsewhere classified: Secondary | ICD-10-CM | POA: Diagnosis not present

## 2021-03-07 DIAGNOSIS — M25572 Pain in left ankle and joints of left foot: Secondary | ICD-10-CM | POA: Diagnosis not present

## 2021-03-07 DIAGNOSIS — R600 Localized edema: Secondary | ICD-10-CM | POA: Diagnosis not present

## 2021-03-07 NOTE — Therapy (Signed)
[facility] [place], [place], [address] Phone: [phone]   Fax:  [phone]  Physical Therapy Treatment  Patient Details  Name: Daniel Macias MRN: [id number] Date of Birth: [birth year] Referring Provider (PT): Rickey Primus MD   Encounter Date: 03/07/2021   PT End of Session - 03/07/21 0949     Visit Number 5    Number of Visits 12    Date for PT Re-Evaluation 05/22/21    Authorization Type FOTO AT LEAST EVERY 5TH VISIT.  PROGRESS NOTE AT 10TH VISIT.  KX MODIFIER AFTER 15 VISITS.    PT Start Time [phone]    PT Stop Time 1038    PT Time Calculation (min) 51 min    Equipment Utilized During Treatment Other (comment)   CAM boot   Activity Tolerance Patient tolerated treatment well    Behavior During Therapy WFL for tasks assessed/performed             Past Medical History:  Diagnosis Date   Allergy    Carpal tunnel syndrome, bilateral    Hx of colonic polyps    Hyperlipidemia    Hypertension    Lower leg fracture    Sleep apnea    CPAP   Vitamin D deficiency     Past Surgical History:  Procedure Laterality Date   CYST REMOVAL HAND Right    wrist    LEG SURGERY Left [place]   DDD    There were no vitals filed for this visit.   Subjective Assessment - 03/07/21 0948     Subjective COVID-19 screen performed prior to patient entering clinic. Reports soreness and discomfort still at the old fracture site.    Pertinent History Bilateral CTS, HTN, back surgery, left ankle fracture (1971).    How long can you walk comfortably? Around home and farm with boot.  Has walked short distance in home without boot.    Patient Stated Goals Get around without left ankle hurting.    Currently in Pain? No/denies                [facility] PT Assessment - 03/07/21 0001       Assessment   Medical Diagnosis Right total ankle replacement.    Referring Provider (PT) Rickey Primus MD    Onset  Date/Surgical Date 01/20/21    Hand Dominance Right    Next MD Visit 03/16/2021      Precautions   Precaution Comments Please progress per protocol.  Under Media and protocol book.                           [place] Adult PT Treatment/Exercise - 03/07/21 0001       Modalities   Modalities Vasopneumatic      Vasopneumatic   Number Minutes Vasopneumatic  10 minutes    Vasopnuematic Location  Ankle    Vasopneumatic Pressure Low    Vasopneumatic Temperature  34      Ankle Exercises: Aerobic   Nustep L5 x15 min      Ankle Exercises: Seated   Towel Crunch Limitations   x2 min   Heel Raises Left;20 reps;10 reps    Toe Raise 20 reps;10 reps    BAPS Sitting;Level 3;15 reps    Other Seated Ankle Exercises Rockerboard Df/Pf x5 min,  PT Long Term Goals - 02/24/21 0951       PT LONG TERM GOAL #1   Title Patient will be independent with HEP    Time 6    Status On-going      PT LONG TERM GOAL #2   Title Increase active left ankle dorsiflexion to 6- 8 degrees to normalize the patients gait pattern.    Time 6    Period Weeks    Status On-going      PT LONG TERM GOAL #3   Title Increase left ankle strength to 4+ to 5/5 to increase stability for functional tasks.    Time 6    Period Weeks    Status On-going      PT LONG TERM GOAL #4   Title Walk 250 feet without assistive device and no deviation.    Time 6    Period Weeks    Status On-going      PT LONG TERM GOAL #5   Title Perform ADL's with left ankle pain not > 3/10.    Time 6    Period Weeks    Status On-going                   Plan - 03/07/21 1043     Clinical Impression Statement Patient continues to report pain along lateral L ankle around region of the old fracture. Patient progressed through only DF and PF exercises to avoid extra added stress to lateral ankle. No crutches were used while in clinic today but still using CAM boot. Patient did not  report any increased pain with therex. Normal vasopnuematic response noted following removal of the modality.    Personal Factors and Comorbidities Comorbidity 1;Other    Comorbidities Bilateral CTS, HTN, back surgery, left ankle fracture (1971).    Examination-Activity Limitations Other;Locomotion Level    Examination-Participation Restrictions Other    Stability/Clinical Decision Making Stable/Uncomplicated    Rehab Potential Excellent    PT Frequency 2x / week    PT Duration 6 weeks    PT Treatment/Interventions ADLs/Self Care Home Management;Cryotherapy;Electrical Stimulation;Moist Heat;Gait training;Stair training;Functional mobility training;Therapeutic activities;Therapeutic exercise;Neuromuscular re-education;Manual techniques;Patient/family education;Passive range of motion    PT Next Visit Plan Please progress per protocol (under Media tab and protocol book).  Vasopneumatic.    PT Home Exercise Plan Active left ankle dorsi and plantarflexion.    Consulted and Agree with Plan of Care Patient             Patient will benefit from skilled therapeutic intervention in order to improve the following deficits and impairments:  Abnormal gait, Decreased activity tolerance, Increased edema, Pain, Decreased range of motion  Visit Diagnosis: Pain in left ankle and joints of left foot  Stiffness of left ankle, not elsewhere classified  Localized edema     Problem List Patient Active Problem List   Diagnosis Date Noted   Short Achilles tendon, left 07/24/2019   Chronic pain of left ankle 07/24/2019   Arthritis of ankle, left 07/24/2019   Whiplash injury to neck 12/31/2018   Cervical spondylosis with radiculopathy 12/25/2018   Biceps tendon rupture, right, initial encounter 12/25/2018   Hypothyroidism (acquired) 12/01/2017   Palpitation 09/30/2013   Allergic rhinitis 05/27/2013   Hypertension 23/53/6144   Metabolic syndrome 31/54/0086   BPH (benign prostatic hyperplasia)  11/25/2012   Obstructive sleep apnea 11/25/2012   Hyperlipemia 05/28/2012   Vitamin D deficiency disease 05/28/2012    Standley Brooking, PTA 03/07/2021, 10:46 AM  [facility] [place], [place], [address] Phone: [phone]   Fax:  [phone]  Name: Daniel Macias MRN: [id number] Date of Birth: [birth year]

## 2021-03-09 ENCOUNTER — Encounter: Payer: Self-pay | Admitting: Physical Therapy

## 2021-03-09 ENCOUNTER — Ambulatory Visit: Payer: Medicare HMO | Admitting: Physical Therapy

## 2021-03-09 ENCOUNTER — Other Ambulatory Visit: Payer: Self-pay

## 2021-03-09 DIAGNOSIS — M25672 Stiffness of left ankle, not elsewhere classified: Secondary | ICD-10-CM | POA: Diagnosis not present

## 2021-03-09 DIAGNOSIS — M25572 Pain in left ankle and joints of left foot: Secondary | ICD-10-CM

## 2021-03-09 DIAGNOSIS — R600 Localized edema: Secondary | ICD-10-CM | POA: Diagnosis not present

## 2021-03-09 NOTE — Therapy (Signed)
[facility] [place], [place], [address] Phone: [phone]   Fax:  [phone]  Physical Therapy Treatment  Patient Details  Name: Daniel Macias MRN: [id number] Date of Birth: 08-11-1950 Referring Provider (PT): Rickey Primus MD   Encounter Date: 03/09/2021   PT End of Session - 03/09/21 0959     Visit Number 6    Number of Visits 12    Date for PT Re-Evaluation 05/22/21    Authorization Type FOTO AT LEAST EVERY 5TH VISIT.  PROGRESS NOTE AT 10TH VISIT.  KX MODIFIER AFTER 15 VISITS.    PT Start Time [phone]    PT Stop Time 1041    PT Time Calculation (min) 47 min    Equipment Utilized During Treatment Other (comment)   CAM boot, unilateral axillary crutch   Activity Tolerance Patient tolerated treatment well    Behavior During Therapy WFL for tasks assessed/performed             Past Medical History:  Diagnosis Date   Allergy    Carpal tunnel syndrome, bilateral    Hx of colonic polyps    Hyperlipidemia    Hypertension    Lower leg fracture    Sleep apnea    CPAP   Vitamin D deficiency     Past Surgical History:  Procedure Laterality Date   CYST REMOVAL HAND Right    wrist    LEG SURGERY Left [place]   DDD    There were no vitals filed for this visit.   Subjective Assessment - 03/09/21 0955     Subjective COVID-19 screen performed prior to patient entering clinic. Reports soreness and discomfort still at the old fracture site.    Pertinent History Bilateral CTS, HTN, back surgery, left ankle fracture (1971).    How long can you walk comfortably? Around home and farm with boot.  Has walked short distance in home without boot.    Patient Stated Goals Get around without left ankle hurting.    Currently in Pain? No/denies                [facility] PT Assessment - 03/09/21 0001       Assessment   Medical Diagnosis Right total ankle replacement.    Referring Provider (PT)  Rickey Primus MD    Onset Date/Surgical Date 01/20/21    Hand Dominance Right    Next MD Visit 03/16/2021      Precautions   Precaution Comments Please progress per protocol.  Under Media and protocol book.                           [facility] Adult PT Treatment/Exercise - 03/09/21 0001       Modalities   Modalities Vasopneumatic      Vasopneumatic   Number Minutes Vasopneumatic  10 minutes    Vasopnuematic Location  Ankle    Vasopneumatic Pressure Low    Vasopneumatic Temperature  34      Ankle Exercises: Aerobic   Nustep L5 x11 min      Ankle Exercises: Seated   Towel Crunch Limitations   x2 min   Heel Raises Left;20 reps;10 reps    Toe Raise 20 reps;10 reps    BAPS Sitting;Level 3;15 reps    Other Seated Ankle Exercises Rockerboard Df/Pf x5 min,    Other Seated Ankle Exercises Prostretch x3 min  PT Long Term Goals - 02/24/21 0951       PT LONG TERM GOAL #1   Title Patient will be independent with HEP    Time 6    Status On-going      PT LONG TERM GOAL #2   Title Increase active left ankle dorsiflexion to 6- 8 degrees to normalize the patients gait pattern.    Time 6    Period Weeks    Status On-going      PT LONG TERM GOAL #3   Title Increase left ankle strength to 4+ to 5/5 to increase stability for functional tasks.    Time 6    Period Weeks    Status On-going      PT LONG TERM GOAL #4   Title Walk 250 feet without assistive device and no deviation.    Time 6    Period Weeks    Status On-going      PT LONG TERM GOAL #5   Title Perform ADL's with left ankle pain not > 3/10.    Time 6    Period Weeks    Status On-going                   Plan - 03/09/21 1040     Clinical Impression Statement Patient presented in clinic with reports of improvement regarding mobility and stretching of L ankle. Patient did report swelling in calf just superior to the L ankle ace bandage. Upon assessment,  no excessive redness present an ace bandage is not overly tight. 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 Patient progressed to prostretch to enhance the flexibility of the L achilles. Limiting L ankle PF notable. Normal vasopneumatic response noted following removal of the modality.    Personal Factors and Comorbidities Comorbidity 1;Other    Comorbidities Bilateral CTS, HTN, back surgery, left ankle fracture (1971).    Examination-Activity Limitations Other;Locomotion Level    Examination-Participation Restrictions Other    Stability/Clinical Decision Making Stable/Uncomplicated    Rehab Potential Excellent    PT Frequency 2x / week    PT Duration 6 weeks    PT Treatment/Interventions ADLs/Self Care Home Management;Cryotherapy;Electrical Stimulation;Moist Heat;Gait training;Stair training;Functional mobility training;Therapeutic activities;Therapeutic exercise;Neuromuscular re-education;Manual techniques;Patient/family education;Passive range of motion    PT Next Visit Plan Please progress per protocol (under Media tab and protocol book).  Vasopneumatic.    PT Home Exercise Plan Active left ankle dorsi and plantarflexion.    Consulted and Agree with Plan of Care Patient             Patient will benefit from skilled therapeutic intervention in order to improve the following deficits and impairments:  Abnormal gait, Decreased activity tolerance, Increased edema, Pain, Decreased range of motion  Visit Diagnosis: Pain in left ankle and joints of left foot  Stiffness of left ankle, not elsewhere classified  Localized edema     Problem List Patient Active Problem List   Diagnosis Date Noted   Short Achilles tendon, left 07/24/2019   Chronic pain of left ankle 07/24/2019   Arthritis of ankle, left 07/24/2019   Whiplash injury to neck 12/31/2018   Cervical spondylosis with radiculopathy 12/25/2018   Biceps tendon rupture, right, initial encounter  12/25/2018   Hypothyroidism (acquired) 12/01/2017   Palpitation 09/30/2013   Allergic rhinitis 05/27/2013   Hypertension 30/86/5784   Metabolic syndrome 69/62/9528   BPH (benign prostatic hyperplasia) 11/25/2012   Obstructive sleep apnea 11/25/2012   Hyperlipemia  05/28/2012   Vitamin D deficiency disease 05/28/2012    Standley Brooking, PTA 03/09/2021, 10:48 AM  [facility] [address] [place], [place], [address] Phone: [phone]   Fax:  [phone]  Name: Daniel Macias MRN: [id number] Date of Birth: [birth year]

## 2021-03-14 ENCOUNTER — Other Ambulatory Visit: Payer: Self-pay

## 2021-03-14 ENCOUNTER — Encounter: Payer: Self-pay | Admitting: Physical Therapy

## 2021-03-14 ENCOUNTER — Ambulatory Visit: Payer: Medicare HMO | Admitting: Physical Therapy

## 2021-03-14 DIAGNOSIS — M25672 Stiffness of left ankle, not elsewhere classified: Secondary | ICD-10-CM | POA: Diagnosis not present

## 2021-03-14 DIAGNOSIS — R600 Localized edema: Secondary | ICD-10-CM | POA: Diagnosis not present

## 2021-03-14 DIAGNOSIS — M25572 Pain in left ankle and joints of left foot: Secondary | ICD-10-CM | POA: Diagnosis not present

## 2021-03-14 NOTE — Therapy (Addendum)
[facility] [place], [place], [address] Phone: [phone]   Fax:  [phone]  Physical Therapy Treatment  Patient Details  Name: Daniel Macias MRN: [id number] Date of Birth: 1950-12-09 Referring Provider (PT): Rickey Primus MD   Encounter Date: 03/14/2021   PT End of Session - 03/14/21 0951     Visit Number 7    Number of Visits 12    Date for PT Re-Evaluation 05/22/21    Authorization Type FOTO AT LEAST EVERY 5TH VISIT.  PROGRESS NOTE AT 10TH VISIT.  KX MODIFIER AFTER 15 VISITS.    PT Start Time [phone]    PT Stop Time 1035    PT Time Calculation (min) 46 min    Equipment Utilized During Treatment Other (comment)   CAM boot, unilateral crutch   Activity Tolerance Patient tolerated treatment well    Behavior During Therapy WFL for tasks assessed/performed             Past Medical History:  Diagnosis Date   Allergy    Carpal tunnel syndrome, bilateral    Hx of colonic polyps    Hyperlipidemia    Hypertension    Lower leg fracture    Sleep apnea    CPAP   Vitamin D deficiency     Past Surgical History:  Procedure Laterality Date   CYST REMOVAL HAND Right    wrist    LEG SURGERY Left [place]   DDD    There were no vitals filed for this visit.   Subjective Assessment - 03/14/21 0949     Subjective COVID-19 screen performed prior to patient entering clinic. Patient still having that lateral ankle pain. Still doing farm chores.    Pertinent History Bilateral CTS, HTN, back surgery, left ankle fracture (1971).    How long can you walk comfortably? Around home and farm with boot.  Has walked short distance in home without boot.    Patient Stated Goals Get around without left ankle hurting.    Currently in Pain? No/denies                [facility] PT Assessment - 03/14/21 0001       Assessment   Medical Diagnosis Right total ankle replacement.    Referring Provider (PT) Rickey Primus MD    Onset Date/Surgical Date 01/20/21    Hand Dominance Right    Next MD Visit 03/16/2021      Precautions   Precaution Comments Please progress per protocol.  Under Media and protocol book.      ROM / Strength   AROM / PROM / Strength AROM      AROM   Overall AROM  Deficits    AROM Assessment Site Ankle    Right/Left Ankle Left    Left Ankle Dorsiflexion 1    Left Ankle Plantar Flexion 24                           [facility] Adult PT Treatment/Exercise - 03/14/21 0001       Modalities   Modalities Vasopneumatic      Vasopneumatic   Number Minutes Vasopneumatic  10 minutes    Vasopnuematic Location  Ankle    Vasopneumatic Pressure Low    Vasopneumatic Temperature  34      Ankle Exercises: Aerobic   Nustep L5 x10 min      Ankle Exercises: Seated  BAPS Sitting;Level 3;15 reps      Ankle Exercises: Standing   Rocker Board 3 minutes    Other Standing Ankle Exercises mini L lunges x20 reps    Other Standing Ankle Exercises L heel/toe sequence x15 reps to improve gait                          PT Long Term Goals - 03/14/21 1019       PT LONG TERM GOAL #1   Title Patient will be independent with HEP    Time 6    Status Achieved      PT LONG TERM GOAL #2   Title Increase active left ankle dorsiflexion to 6- 8 degrees to normalize the patient's gait pattern.    Time 6    Period Weeks    Status On-going      PT LONG TERM GOAL #3   Title Increase left ankle strength to 4+ to 5/5 to increase stability for functional tasks.    Time 6    Period Weeks    Status On-going      PT LONG TERM GOAL #4   Title Walk 250 feet without assistive device and no deviation.    Time 6    Period Weeks    Status On-going      PT LONG TERM GOAL #5   Title Perform ADL's with left ankle pain not > 3/10.    Time 6    Period Weeks    Status Achieved   sharp pain if crossing his legs                  Plan - 03/14/21 1027     Clinical  Impression Statement Patient presented in clinic with reports of some lateral ankle pain. Patient progressed through more standing tolerance activities and gait simulating activities to prepare for progression out of CAM boot. Patient able to tolerate exercises well. Continues ROM limitation and tightness of the ankle notable. 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 Normal vasopneumatic response noted following removal of the modality.    Personal Factors and Comorbidities Comorbidity 1;Other    Comorbidities Bilateral CTS, HTN, back surgery, left ankle fracture (1971).    Examination-Activity Limitations Other;Locomotion Level    Examination-Participation Restrictions Other    Stability/Clinical Decision Making Stable/Uncomplicated    Rehab Potential Excellent    PT Frequency 2x / week    PT Duration 6 weeks    PT Treatment/Interventions ADLs/Self Care Home Management;Cryotherapy;Electrical Stimulation;Moist Heat;Gait training;Stair training;Functional mobility training;Therapeutic activities;Therapeutic exercise;Neuromuscular re-education;Manual techniques;Patient/family education;Passive range of motion    PT Next Visit Plan Please progress per protocol (under Media tab and protocol book).  Vasopneumatic.    PT Home Exercise Plan Active left ankle dorsi and plantarflexion.    Consulted and Agree with Plan of Care Patient             Patient will benefit from skilled therapeutic intervention in order to improve the following deficits and impairments:  Abnormal gait, Decreased activity tolerance, Increased edema, Pain, Decreased range of motion  Visit Diagnosis: Pain in left ankle and joints of left foot  Stiffness of left ankle, not elsewhere classified  Localized edema     Problem List Patient Active Problem List   Diagnosis Date Noted   Short Achilles tendon, left 07/24/2019   Chronic pain of left ankle 07/24/2019   Arthritis of ankle, left  07/24/2019  Whiplash injury to neck 12/31/2018   Cervical spondylosis with radiculopathy 12/25/2018   Biceps tendon rupture, right, initial encounter 12/25/2018   Hypothyroidism (acquired) 12/01/2017   Palpitation 09/30/2013   Allergic rhinitis 05/27/2013   Hypertension 53/39/1792   Metabolic syndrome 17/83/7542   BPH (benign prostatic hyperplasia) 11/25/2012   Obstructive sleep apnea 11/25/2012   Hyperlipemia 05/28/2012   Vitamin D deficiency disease 05/28/2012    Standley Brooking, PTA 03/14/2021, 10:42 AM  [facility] [place], [place], [address] Phone: [phone]   Fax:  [phone]  Name: Daniel Macias MRN: [id number] Date of Birth: 1950/08/20  PHYSICAL THERAPY DISCHARGE SUMMARY  Visits from Start of Care: 7.  Current functional level related to goals / functional outcomes: See above.   Remaining deficits: See goal section.   Education / Equipment: HEP.   Patient agrees to discharge. Patient goals were partially met. Patient is being discharged due to being pleased with the current functional level.    Mali Applegate MPT

## 2021-03-16 DIAGNOSIS — M25572 Pain in left ankle and joints of left foot: Secondary | ICD-10-CM | POA: Diagnosis not present

## 2021-03-16 DIAGNOSIS — Z96662 Presence of left artificial ankle joint: Secondary | ICD-10-CM | POA: Diagnosis not present

## 2021-03-16 DIAGNOSIS — G8929 Other chronic pain: Secondary | ICD-10-CM | POA: Diagnosis not present

## 2021-03-21 ENCOUNTER — Ambulatory Visit: Payer: Medicare HMO | Admitting: Physical Therapy

## 2021-03-21 DIAGNOSIS — L820 Inflamed seborrheic keratosis: Secondary | ICD-10-CM | POA: Diagnosis not present

## 2021-03-21 DIAGNOSIS — L821 Other seborrheic keratosis: Secondary | ICD-10-CM | POA: Diagnosis not present

## 2021-03-21 DIAGNOSIS — L570 Actinic keratosis: Secondary | ICD-10-CM | POA: Diagnosis not present

## 2021-03-23 ENCOUNTER — Encounter: Payer: PRIVATE HEALTH INSURANCE | Admitting: *Deleted

## 2021-03-31 DIAGNOSIS — G4733 Obstructive sleep apnea (adult) (pediatric): Secondary | ICD-10-CM | POA: Diagnosis not present

## 2021-04-20 DIAGNOSIS — Z96662 Presence of left artificial ankle joint: Secondary | ICD-10-CM | POA: Diagnosis not present

## 2021-04-20 DIAGNOSIS — M19072 Primary osteoarthritis, left ankle and foot: Secondary | ICD-10-CM | POA: Diagnosis not present

## 2021-04-20 DIAGNOSIS — G8929 Other chronic pain: Secondary | ICD-10-CM | POA: Diagnosis not present

## 2021-04-26 NOTE — Progress Notes (Signed)
HPI ?M never smoker followed for OSA, complicated by HTN, Allergic Rhinitis, Hypothyroid, Cervical Spondylosis, BPH, Hyperlipidemia, Obesity,  ?NPSG 06/03/12  AHI 13, desaturation to 74%, body weight 201 lbs ? ?=============================================================== ? ?04/27/20- 71 yoM never smoker for sleep evaluation courtesy of Dr Ronnie Doss with concern of OSA ?Medical problem list includes HTN, Allergic Rhinitis, Hypothyroid, Cervical Spondylosis, BPH, Hyperlipidemia, Obesity,  ?NPSG 06/03/12  AHI 13, desaturation to 74%, body weight 201 lbs ?Was seen by Dr Halford Chessman in 2018, compliant then with CPAP auto 5-15  [employer] ?Epworth score-14 ?Body weight today-207 lbs                            [name] is our CPAP patient ?Download-requested                                     He is retired Agricultural consultant, now farmer ?Covid vax- 3 Phizer ?Flu vax-had ?------Patient currently wears CPAP, needs to establish care, patient wakes up 2-3 times a night but overall sleeps ok and machine is working good. ?Says he was never called back for f/u after last seen in 2018. Machine is working well, but about 55-56 years old. Discussed replacement and supply chain problems. Discussed treatment alternatives,He is not interested in [place] surgery and has had a lot of dental repair which I think rules out an oral appliance.  ?No ENT surgery. Snores loudly w/o CPAP. He has been very compliant and is satisfied to continue CPAP. ? ?04/27/21- 71 yoM never smoker followed for OSA, complicated by HTN, Allergic Rhinitis, Hypothyroid, Cervical Spondylosis, BPH, Hyperlipidemia, Obesity,  ?CPAP auto 5-20/ [employer]   replaced 3/22      AirSense 11 AutoSet ?Download compliance-93%, AHI 0.5/ hr ?Body weight today-216 lbs ?Covid vax-3 Phizer ?Flu vax-had ?Download reviewed.  He is comfortable with his CPAP and mask-no concerns. ?Breathing is comfortable with no acute events. ?Had total left ankle replacement ? ?ROS-see HPI   + = positive ?Constitutional:     weight loss, night sweats, fevers, chills, fatigue, lassitude. ?HEENT:    headaches, difficulty swallowing, tooth/dental problems, sore throat,  ?     sneezing, itching, ear ache, nasal congestion, post nasal drip, snoring ?CV:    chest pain, orthopnea, PND, swelling in lower extremities, anasarca,                                   ?dizziness, palpitations ?Resp:   shortness of breath with exertion or at rest.   ?             productive cough,   non-productive cough, coughing up of blood.   ?           change in color of mucus.  wheezing.   ?Skin:    rash or lesions. ?GI:  No-   heartburn, indigestion, abdominal pain, nausea, vomiting, diarrhea,  ?               change in bowel habits, loss of appetite ?GU: dysuria, change in color of urine, no urgency or frequency.   flank pain. ?MS:  + joint pain, stiffness, decreased range of motion, back pain. ?Neuro-     nothing unusual ?Psych:  change in mood or affect.  depression or anxiety.   memory loss. ? ?OBJ- Physical  Exam ?General- Alert, Oriented, Affect-appropriate, Distress- none acute, +overweight/ stocky ?Skin- rash-none, lesions- none, excoriation- none ?Lymphadenopathy- none ?Head- atraumatic ?           Eyes- Gross vision intact, PERRLA, conjunctivae and secretions clear ?           Ears- Hearing, canals-normal ?           Nose- Clear, no-Septal dev, mucus, polyps, erosion, perforation  ?           Throat- Mallampati IV , mucosa clear , drainage- none, tonsils- atrophic, + extensive bridgework/ repair ?Neck- flexible , trachea midline, no stridor , thyroid nl, carotid no bruit ?Chest - symmetrical excursion , unlabored ?          Heart/CV- RRR , no murmur , no gallop  , no rub, nl s1 s2 ?                          - JVD- none , edema- none, stasis changes- none, varices- none ?          Lung- clear to P&A, wheeze- none, cough- none , dullness-none, rub- none ?          Chest wall-  ?Abd-  ?Br/ Gen/ Rectal- Not done, not indicated ?Extrem- cyanosis- none,  clubbing, none, atrophy- none, strength- nl ?Neuro- grossly intact to observation ? ? ?  ?

## 2021-04-27 ENCOUNTER — Other Ambulatory Visit: Payer: Self-pay

## 2021-04-27 ENCOUNTER — Encounter: Payer: Self-pay | Admitting: Internal Medicine

## 2021-04-27 ENCOUNTER — Ambulatory Visit: Payer: Medicare HMO | Admitting: Internal Medicine

## 2021-04-27 DIAGNOSIS — G4733 Obstructive sleep apnea (adult) (pediatric): Secondary | ICD-10-CM

## 2021-04-27 DIAGNOSIS — I10 Essential (primary) hypertension: Secondary | ICD-10-CM

## 2021-04-27 NOTE — Assessment & Plan Note (Signed)
We have discussed the association between hypertension and untreated OSA previously ?

## 2021-04-27 NOTE — Assessment & Plan Note (Signed)
Good compliance and control.  Benefits from CPAP. ?Plan-continue auto 5-20 ?

## 2021-04-27 NOTE — Patient Instructions (Signed)
We can continue CPAP 5-20, mask of choice, humidifier, supplies, Airview/ card ? ?Please call if we can help ?

## 2021-04-28 DIAGNOSIS — G4733 Obstructive sleep apnea (adult) (pediatric): Secondary | ICD-10-CM | POA: Diagnosis not present

## 2021-04-29 ENCOUNTER — Other Ambulatory Visit: Payer: Self-pay | Admitting: Family Medicine

## 2021-04-29 DIAGNOSIS — E039 Hypothyroidism, unspecified: Secondary | ICD-10-CM

## 2021-05-02 ENCOUNTER — Other Ambulatory Visit: Payer: Self-pay | Admitting: Family Medicine

## 2021-05-29 DIAGNOSIS — G4733 Obstructive sleep apnea (adult) (pediatric): Secondary | ICD-10-CM | POA: Diagnosis not present

## 2021-06-07 ENCOUNTER — Ambulatory Visit (INDEPENDENT_AMBULATORY_CARE_PROVIDER_SITE_OTHER): Payer: Medicare HMO | Admitting: Family Medicine

## 2021-06-07 ENCOUNTER — Encounter: Payer: Self-pay | Admitting: Family Medicine

## 2021-06-07 VITALS — BP 115/69 | HR 70 | Temp 98.3°F | Ht 68.0 in | Wt 213.4 lb

## 2021-06-07 DIAGNOSIS — Z6832 Body mass index (BMI) 32.0-32.9, adult: Secondary | ICD-10-CM

## 2021-06-07 DIAGNOSIS — E039 Hypothyroidism, unspecified: Secondary | ICD-10-CM

## 2021-06-07 DIAGNOSIS — R739 Hyperglycemia, unspecified: Secondary | ICD-10-CM | POA: Diagnosis not present

## 2021-06-07 DIAGNOSIS — Z0001 Encounter for general adult medical examination with abnormal findings: Secondary | ICD-10-CM | POA: Diagnosis not present

## 2021-06-07 DIAGNOSIS — E559 Vitamin D deficiency, unspecified: Secondary | ICD-10-CM

## 2021-06-07 DIAGNOSIS — Z125 Encounter for screening for malignant neoplasm of prostate: Secondary | ICD-10-CM

## 2021-06-07 DIAGNOSIS — E6609 Other obesity due to excess calories: Secondary | ICD-10-CM

## 2021-06-07 DIAGNOSIS — I10 Essential (primary) hypertension: Secondary | ICD-10-CM | POA: Diagnosis not present

## 2021-06-07 DIAGNOSIS — E782 Mixed hyperlipidemia: Secondary | ICD-10-CM

## 2021-06-07 DIAGNOSIS — Z0000 Encounter for general adult medical examination without abnormal findings: Secondary | ICD-10-CM

## 2021-06-07 DIAGNOSIS — E66811 Obesity, class 1: Secondary | ICD-10-CM

## 2021-06-07 DIAGNOSIS — L720 Epidermal cyst: Secondary | ICD-10-CM

## 2021-06-07 LAB — BAYER DCA HB A1C WAIVED: HB A1C (BAYER DCA - WAIVED): 6.3 % — ABNORMAL HIGH (ref 4.8–5.6)

## 2021-06-07 MED ORDER — DOXYCYCLINE HYCLATE 100 MG PO TABS: 100.0000 mg | ORAL_TABLET | Freq: Two times a day (BID) | ORAL | 0 refills | Status: AC

## 2021-06-07 MED ORDER — LEVOTHYROXINE SODIUM 25 MCG PO TABS: 25.0000 ug | ORAL_TABLET | Freq: Every day | ORAL | 3 refills | Status: DC

## 2021-06-07 MED ORDER — ROSUVASTATIN CALCIUM 10 MG PO TABS: 10.0000 mg | ORAL_TABLET | Freq: Every day | ORAL | 3 refills | Status: DC

## 2021-06-07 NOTE — Patient Instructions (Signed)
Epidermoid Cyst  An epidermoid cyst, also called an epidermal cyst, is a small lump under your skin. The cyst contains a substance called keratin. Do not try to pop or open the cyst yourself. What are the causes? A blocked hair follicle. A hair that curls and re-enters the skin instead of growing straight out of the skin. A blocked pore. Irritated skin. An injury to the skin. Certain conditions that are passed along from parent to child. Human papillomavirus (HPV). This happens rarely when cysts occur on the bottom of the feet. Long-term sun damage to the skin. What increases the risk? Having acne. Being male. Having an injury to the skin. Being past puberty. Having certain conditions caused by genes (genetic disorder) What are the signs or symptoms? These cysts are usually harmless, but they can get infected. Symptoms of infection may include: Redness. Inflammation. Tenderness. Warmth. Fever. A bad-smelling substance that drains from the cyst. Pus that drains from the cyst. How is this treated? In many cases, epidermoid cysts go away on their own without treatment. If a cyst becomes infected, treatment may include: Opening and draining the cyst, done by a doctor. After draining, you may need minor surgery to remove the rest of the cyst. Antibiotic medicine. Shots of medicines (steroids) that help to reduce inflammation. Surgery to remove the cyst. Surgery may be done if the cyst: Becomes large. Bothers you. Has a chance of turning into cancer. Do not try to open a cyst yourself. Follow these instructions at home: Medicines Take over-the-counter and prescription medicines as told by your doctor. If you were prescribed an antibiotic medicine, take it as told by your doctor. Do not stop taking it even if you start to feel better. General instructions Keep the area around your cyst clean and dry. Wear loose, dry clothing. Avoid touching your cyst. Check your cyst every day  for signs of infection. Check for: Redness, swelling, or pain. Fluid or blood. Warmth. Pus or a bad smell. Keep all follow-up visits. How is this prevented? Wear clean, dry, clothing. Avoid wearing tight clothing. Keep your skin clean and dry. Take showers or baths every day. Contact a doctor if: Your cyst has symptoms of infection. Your condition does not improve or gets worse. You have a cyst that looks different from other cysts you have had. You have a fever. Get help right away if: Redness spreads from the cyst into the area close by. Summary An epidermoid cyst is a small lump under your skin. If a cyst becomes infected, treatment may include surgery to open and drain the cyst, or to remove it. Take over-the-counter and prescription medicines only as told by your doctor. Contact a doctor if your condition is not improving or is getting worse. Keep all follow-up visits. This information is not intended to replace advice given to you by your health care provider. Make sure you discuss any questions you have with your health care provider. Document Revised: 05/06/2019 Document Reviewed: 05/06/2019 Elsevier Patient Education  2023 [employer]  

## 2021-06-07 NOTE — Progress Notes (Signed)
? ?Daniel Macias is a 72 y.o. male presents to office today for annual physical exam examination.   ? ?Concerns today include: ?1.  Right thigh lesion ?Patient reports that he had a spot on the right that seemed like an ingrown hair but has been present for about a month and still remains somewhat tender to touch.  Does not report any discharge, fever.  No significant treatments thus far.  Has had similar in the past but this typically resolved. ? ?Occupation: Retired Substance use: None ?Diet: High fiber, water, Exercise: Walks several miles daily ?Last eye exam: Up-to-date.  Will be seeing an eye doctor again in the next few months to follow-up on a vascular change in one of his eyes ?Last dental exam: Up-to-date ?Last colonoscopy: Up-to-date ?Refills needed today: None ?Immunizations needed: ?Immunization History  ?Administered Date(s) Administered  ? Fluad Quad(high Dose 65+) 12/25/2018, 12/26/2019, 12/06/2020  ? Influenza, High Dose Seasonal PF 11/28/2017  ? Influenza,inj,Quad PF,6+ Mos 12/01/2014  ? Influenza-Unspecified 11/12/2012, 12/15/2015  ? PFIZER(Purple Top)SARS-COV-2 Vaccination 04/05/2019, 04/29/2019, 01/22/2020  ? Pneumococcal Conjugate-13 06/01/2014, 11/15/2014  ? Pneumococcal Polysaccharide-23 06/02/2015, 11/14/2015  ? Tdap 11/25/2012  ? Zoster Recombinat (Shingrix) 06/01/2020  ? Zoster, Live 05/28/2012  ? ? ? ?Past Medical History:  ?Diagnosis Date  ? Allergy   ? Carpal tunnel syndrome, bilateral   ? Hx of colonic polyps   ? Hyperlipidemia   ? Hypertension   ? Lower leg fracture   ? Sleep apnea   ? CPAP  ? Vitamin D deficiency   ? ?Social History  ? ?Socioeconomic History  ? Marital status: Married  ?  Spouse name: linda  ? Number of children: 2  ? Years of education: Not on file  ? Highest education level: Not on file  ?Occupational History  ? Occupation: retired   ?  Comment: construction   ?Tobacco Use  ? Smoking status: Never  ? Smokeless tobacco: Never  ?Vaping Use  ? Vaping Use: Never  used  ?Substance and Sexual Activity  ? Alcohol use: Yes  ?  Comment: occasional  ? Drug use: No  ? Sexual activity: Yes  ?Other Topics Concern  ? Not on file  ?Social History Narrative  ? 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 son is Dr Wynetta Emery, optometrist at [facility] in [place]  ? ?Social Determinants of Health  ? ?Financial Resource Strain: Low Risk   ? Difficulty of Paying Living Expenses: Not hard at all  ?Food Insecurity: No Food Insecurity  ? Worried About Charity fundraiser in the Last Year: Never true  ? Ran Out of Food in the Last Year: Never true  ?Transportation Needs: No Transportation Needs  ? Lack of Transportation (Medical): No  ? Lack of Transportation (Non-Medical): No  ?Physical Activity: Not on file  ?Stress: No Stress Concern Present  ? Feeling of Stress : Not at all  ?Social Connections: Socially Integrated  ? Frequency of Communication with Friends and Family: More than three times a week  ? Frequency of Social Gatherings with Friends and Family: More than three times a week  ? Attends Religious Services: More than 4 times per year  ? Active Member of Clubs or Organizations: Yes  ? Attends Archivist Meetings: More than 4 times per year  ? Marital Status: Married  ?Intimate Partner Violence: Not At Risk  ? Fear of Current or Ex-Partner: No  ? Emotionally Abused: No  ? Physically Abused: No  ? Sexually Abused: No  ? ?  Past Surgical History:  ?Procedure Laterality Date  ? CYST REMOVAL HAND Right   ? wrist   ? LEG SURGERY Left 1971  ? [place]  ? DDD  ? TOTAL ANKLE REPLACEMENT Left   ? ?Family History  ?Problem Relation Age of Onset  ? Hypertension Mother   ? Cancer Mother   ?     uterine -METS to lungs  ? Diabetes Mother   ? Emphysema Father   ? Heart disease Father   ? Heart attack Brother 24  ?     x 2   ? Heart disease Brother   ? Hypertension Brother   ? Diabetes Brother   ? Crohn's disease Son   ? Heart attack Paternal Grandfather   ? ? ?Current Outpatient Medications:  ?   aspirin EC 81 MG tablet, Take 81 mg by mouth daily., Disp: , Rfl:  ?  azelastine (ASTELIN) 0.1 % nasal spray, Place 2 sprays into both nostrils at bedtime. Use in each nostril as directed, Disp: 30 mL, Rfl: 2 ?  Ergocalciferol (VITAMIN D2) 10 MCG (400 UNIT) TABS, , Disp: , Rfl:  ?  fluticasone (FLONASE) 50 MCG/ACT nasal spray, Place 2 sprays into both nostrils daily as needed., Disp: 48 mL, Rfl: 1 ?  levothyroxine (SYNTHROID) 25 MCG tablet, TAKE 1 TABLET BY MOUTH DAILY BEFORE BREAKFAST., Disp: 90 tablet, Rfl: 0 ?  Omega-3 Fatty Acids (FISH OIL) 1000 MG CAPS, , Disp: , Rfl:  ?  rosuvastatin (CRESTOR) 10 MG tablet, TAKE 1 TABLET BY MOUTH EVERY DAY, Disp: 90 tablet, Rfl: 0 ? ?Allergies  ?Allergen Reactions  ? Morphine And Related Itching  ? Lisinopril Cough  ?  ? ?ROS: ?Review of Systems ?Pertinent items noted in HPI and remainder of comprehensive ROS otherwise negative.   ? ?Physical exam ?BP 115/69   Pulse 70   Temp 98.3 ?F (36.8 ?C)   Ht _0  (1.727 m)   Wt 213 lb 6.4 oz (96.8 kg)   SpO2 96%   BMI 32.45 kg/m?  ?General appearance: alert, cooperative, appears stated age, no distress, and mildly obese ?Head: Normocephalic, without obvious abnormality, atraumatic ?Eyes: negative findings: lids and lashes normal, conjunctivae and sclerae normal, corneas clear, and pupils equal, round, reactive to light and accomodation ?Ears: normal TM's and external ear canals both ears ?Nose: Nares normal. Septum midline. Mucosa normal. No drainage or sinus tenderness. ?Throat: lips, mucosa, and tongue normal; teeth and gums normal ?Neck: no adenopathy, no carotid bruit, supple, symmetrical, trachea midline, and thyroid not enlarged, symmetric, no tenderness/mass/nodules ?Back: symmetric, no curvature. ROM normal. No CVA tenderness. ?Lungs: clear to auscultation bilaterally ?Chest wall: no tenderness ?Heart: regular rate and rhythm, S1, S2 normal, no murmur, click, rub or gallop ?Abdomen: soft, non-tender; bowel sounds normal;  no masses,  no organomegaly ?Extremities: extremities normal, atraumatic, no cyanosis or edema ?Pulses: 2+ and symmetric ?Skin:  Less than dime sized soft tissue lesion consistent with AN epithelial or dermoid cyst.  Mild erythema and tenderness palpation but no exudate.  Central punctum is plugged and nondraining ?Lymph nodes: Cervical, supraclavicular, and axillary nodes normal. ?Neurologic: Grossly normal ?Psych: Mood stable, speech normal, affect appropriate ? ?[place] Office Visit from 06/07/2021 in [place]  ?PHQ-2 Total Score 0  ? ?  ? ?Assessment/ Plan: ?[name] here for annual physical exam.  ? ?Annual physical exam ? ?Class 1 obesity due to excess calories with serious comorbidity and body mass index (BMI) of  32.0 to 32.9 in adult - Plan: Lipid Panel, CMP14+EGFR, TSH, Bayer DCA Hb A1c Waived ? ?Mixed hyperlipidemia - Plan: Lipid Panel, CMP14+EGFR, TSH ? ?Primary hypertension - Plan: CMP14+EGFR ? ?Acquired hypothyroidism - Plan: TSH, T4, Free, levothyroxine (SYNTHROID) 25 MCG tablet ? ?Epithelial inclusion cyst - Plan: doxycycline (VIBRA-TABS) 100 MG tablet, CBC ? ?Vitamin D deficiency - Plan: VITAMIN D 25 Hydroxy (Vit-D Deficiency, Fractures) ? ?Screening for malignant neoplasm of prostate - Plan: PSA ? ?Needs shingles vaccination. ? ?Check fasting lipid, A1c, CMP and TSH given obesity, hyperlipidemia, hypertension and hypothyroidism.  He seems to be asymptomatic from a thyroid standpoint except for some energy fluctuations ? ?The lesion on the right thigh appears to be consistent with an inclusion cyst.  Its not actively draining but it does have mild surrounding erythema and so we will empirically treat with doxycycline.  We discussed that if he does not have any significant improvement with this treatment regimen then we can place a referral to Dr. Constance Haw for excision.  Check CBC ? ?Vitamin D level and PSA also ordered in addition to his other fasting labs  today ? ?Counseled on healthy lifestyle choices, including diet (rich in fruits, vegetables and lean meats and low in salt and simple carbohydrates) and exercise (at least 30 minutes of moderate physical activity dail

## 2021-06-08 LAB — CMP14+EGFR
ALT: 23 IU/L (ref 0–44)
AST: 25 IU/L (ref 0–40)
Albumin/Globulin Ratio: 1.6 (ref 1.2–2.2)
Albumin: 4.4 g/dL (ref 3.7–4.7)
Alkaline Phosphatase: 87 IU/L (ref 44–121)
BUN/Creatinine Ratio: 14 (ref 10–24)
BUN: 12 mg/dL (ref 8–27)
Bilirubin Total: 0.4 mg/dL (ref 0.0–1.2)
CO2: 23 mmol/L (ref 20–29)
Calcium: 9.2 mg/dL (ref 8.6–10.2)
Chloride: 102 mmol/L (ref 96–106)
Creatinine, Ser: 0.88 mg/dL (ref 0.76–1.27)
Globulin, Total: 2.8 g/dL (ref 1.5–4.5)
Glucose: 107 mg/dL — ABNORMAL HIGH (ref 70–99)
Potassium: 5.1 mmol/L (ref 3.5–5.2)
Sodium: 137 mmol/L (ref 134–144)
Total Protein: 7.2 g/dL (ref 6.0–8.5)
eGFR: 91 mL/min/{1.73_m2} (ref >59)

## 2021-06-08 LAB — TSH: TSH: 3.61 u[IU]/mL (ref 0.450–4.500)

## 2021-06-08 LAB — LIPID PANEL
Chol/HDL Ratio: 2.2 ratio (ref 0.0–5.0)
Cholesterol, Total: 130 mg/dL (ref 100–199)
HDL: 59 mg/dL (ref >39)
LDL Chol Calc (NIH): 59 mg/dL (ref 0–99)
Triglycerides: 54 mg/dL (ref 0–149)
VLDL Cholesterol Cal: 12 mg/dL (ref 5–40)

## 2021-06-08 LAB — CBC
Hematocrit: 44.6 % (ref 37.5–51.0)
Hemoglobin: 15 g/dL (ref 13.0–17.7)
MCH: 29.1 pg (ref 26.6–33.0)
MCHC: 33.6 g/dL (ref 31.5–35.7)
MCV: 86 fL (ref 79–97)
Platelets: 248 10*3/uL (ref 150–450)
RBC: 5.16 x10E6/uL (ref 4.14–5.80)
RDW: 13.2 % (ref 11.6–15.4)
WBC: 5.8 10*3/uL (ref 3.4–10.8)

## 2021-06-08 LAB — PSA: Prostate Specific Ag, Serum: 0.7 ng/mL (ref 0.0–4.0)

## 2021-06-08 LAB — T4, FREE: Free T4: 1.14 ng/dL (ref 0.82–1.77)

## 2021-06-08 LAB — VITAMIN D 25 HYDROXY (VIT D DEFICIENCY, FRACTURES): Vit D, 25-Hydroxy: 40.3 ng/mL (ref 30.0–100.0)

## 2021-06-16 ENCOUNTER — Ambulatory Visit: Payer: Medicare HMO

## 2021-06-26 DIAGNOSIS — H3540 Unspecified peripheral retinal degeneration: Secondary | ICD-10-CM | POA: Diagnosis not present

## 2021-06-26 DIAGNOSIS — H3561 Retinal hemorrhage, right eye: Secondary | ICD-10-CM | POA: Diagnosis not present

## 2021-06-26 DIAGNOSIS — H35033 Hypertensive retinopathy, bilateral: Secondary | ICD-10-CM | POA: Diagnosis not present

## 2021-06-26 DIAGNOSIS — H43823 Vitreomacular adhesion, bilateral: Secondary | ICD-10-CM | POA: Diagnosis not present

## 2021-06-28 DIAGNOSIS — G4733 Obstructive sleep apnea (adult) (pediatric): Secondary | ICD-10-CM | POA: Diagnosis not present

## 2021-07-11 DIAGNOSIS — D485 Neoplasm of uncertain behavior of skin: Secondary | ICD-10-CM | POA: Diagnosis not present

## 2021-07-11 DIAGNOSIS — L570 Actinic keratosis: Secondary | ICD-10-CM | POA: Diagnosis not present

## 2021-07-11 DIAGNOSIS — C44722 Squamous cell carcinoma of skin of right lower limb, including hip: Secondary | ICD-10-CM | POA: Diagnosis not present

## 2021-07-19 ENCOUNTER — Ambulatory Visit: Payer: Medicare HMO

## 2021-07-20 DIAGNOSIS — G8929 Other chronic pain: Secondary | ICD-10-CM | POA: Diagnosis not present

## 2021-07-20 DIAGNOSIS — M19079 Primary osteoarthritis, unspecified ankle and foot: Secondary | ICD-10-CM | POA: Diagnosis not present

## 2021-07-20 DIAGNOSIS — M25572 Pain in left ankle and joints of left foot: Secondary | ICD-10-CM | POA: Diagnosis not present

## 2021-07-20 DIAGNOSIS — Z96662 Presence of left artificial ankle joint: Secondary | ICD-10-CM | POA: Diagnosis not present

## 2021-07-20 DIAGNOSIS — M19072 Primary osteoarthritis, left ankle and foot: Secondary | ICD-10-CM | POA: Diagnosis not present

## 2021-07-20 DIAGNOSIS — Z09 Encounter for follow-up examination after completed treatment for conditions other than malignant neoplasm: Secondary | ICD-10-CM | POA: Diagnosis not present

## 2021-07-26 ENCOUNTER — Ambulatory Visit: Payer: Medicare HMO

## 2021-09-28 DIAGNOSIS — E7800 Pure hypercholesterolemia, unspecified: Secondary | ICD-10-CM | POA: Diagnosis not present

## 2021-09-28 DIAGNOSIS — H35363 Drusen (degenerative) of macula, bilateral: Secondary | ICD-10-CM | POA: Diagnosis not present

## 2021-09-28 DIAGNOSIS — H25813 Combined forms of age-related cataract, bilateral: Secondary | ICD-10-CM | POA: Diagnosis not present

## 2021-09-28 DIAGNOSIS — H52229 Regular astigmatism, unspecified eye: Secondary | ICD-10-CM | POA: Diagnosis not present

## 2021-11-13 DIAGNOSIS — L821 Other seborrheic keratosis: Secondary | ICD-10-CM | POA: Diagnosis not present

## 2021-11-13 DIAGNOSIS — L570 Actinic keratosis: Secondary | ICD-10-CM | POA: Diagnosis not present

## 2021-11-13 DIAGNOSIS — Z85828 Personal history of other malignant neoplasm of skin: Secondary | ICD-10-CM | POA: Diagnosis not present

## 2021-11-13 DIAGNOSIS — L812 Freckles: Secondary | ICD-10-CM | POA: Diagnosis not present

## 2021-11-13 DIAGNOSIS — L814 Other melanin hyperpigmentation: Secondary | ICD-10-CM | POA: Diagnosis not present

## 2021-12-06 ENCOUNTER — Encounter: Payer: Self-pay | Admitting: Family Medicine

## 2021-12-06 ENCOUNTER — Ambulatory Visit (INDEPENDENT_AMBULATORY_CARE_PROVIDER_SITE_OTHER): Payer: Medicare HMO | Admitting: Family Medicine

## 2021-12-06 VITALS — BP 121/80 | HR 79 | Temp 97.6°F | Ht 68.0 in | Wt 202.4 lb

## 2021-12-06 DIAGNOSIS — R69 Illness, unspecified: Secondary | ICD-10-CM | POA: Diagnosis not present

## 2021-12-06 DIAGNOSIS — E039 Hypothyroidism, unspecified: Secondary | ICD-10-CM | POA: Diagnosis not present

## 2021-12-06 DIAGNOSIS — R7303 Prediabetes: Secondary | ICD-10-CM

## 2021-12-06 DIAGNOSIS — M25531 Pain in right wrist: Secondary | ICD-10-CM | POA: Diagnosis not present

## 2021-12-06 DIAGNOSIS — M25532 Pain in left wrist: Secondary | ICD-10-CM | POA: Diagnosis not present

## 2021-12-06 DIAGNOSIS — F4321 Adjustment disorder with depressed mood: Secondary | ICD-10-CM | POA: Diagnosis not present

## 2021-12-06 LAB — BAYER DCA HB A1C WAIVED: HB A1C (BAYER DCA - WAIVED): 6.4 % — ABNORMAL HIGH (ref 4.8–5.6)

## 2021-12-06 MED ORDER — DICLOFENAC SODIUM 1 % EX GEL: 2.0000 g | Freq: Three times a day (TID) | CUTANEOUS | 1 refills | Status: DC | PRN

## 2021-12-06 NOTE — Progress Notes (Signed)
Subjective: CC: Follow-up hypothyroidism, prediabetes PCP: Janora Norlander, DO BJS:EGBTDV Daniel Macias is a 71 y.o. male presenting to clinic today for:  1.  Hypothyroidism Patient is compliant with thyroid replacement.  No tremor.  2.  Grief Patient lost his sister recently to cancer.  He of course is grieving her loss but feels very supported by his family.  He likely did get to see her before she passed away  3.  Prediabetes Continues to stay physically active.  He recently showed goats.  He has bilateral wrist pain and notes that he pretty much has arthritis in those or wrists.  Wondering if there is anything that he can do to help.   ROS: Per HPI  Allergies  Allergen Reactions   Morphine And Related Itching   Lisinopril Cough   Past Medical History:  Diagnosis Date   Allergy    Carpal tunnel syndrome, bilateral    Hx of colonic polyps    Hyperlipidemia    Hypertension    Lower leg fracture    Sleep apnea    CPAP   Vitamin D deficiency     Current Outpatient Medications:    azelastine (ASTELIN) 0.1 % nasal spray, Place 2 sprays into both nostrils at bedtime. Use in each nostril as directed, Disp: 30 mL, Rfl: 2   Ergocalciferol (VITAMIN D2) 10 MCG (400 UNIT) TABS, , Disp: , Rfl:    fluticasone (FLONASE) 50 MCG/ACT nasal spray, Place 2 sprays into both nostrils daily as needed., Disp: 48 mL, Rfl: 1   levothyroxine (SYNTHROID) 25 MCG tablet, Take 1 tablet (25 mcg total) by mouth daily before breakfast., Disp: 90 tablet, Rfl: 3   Omega-3 Fatty Acids (FISH OIL) 1000 MG CAPS, , Disp: , Rfl:    rosuvastatin (CRESTOR) 10 MG tablet, Take 1 tablet (10 mg total) by mouth daily., Disp: 90 tablet, Rfl: 3   aspirin EC 81 MG tablet, Take 81 mg by mouth daily. 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 son is Dr Wynetta Emery, optometrist at [facility] in [place] Strain: Low Risk  (06/15/2020)   Overall Financial Resource Strain (CARDIA)    Difficulty of Paying Living Expenses: Not hard at all  Food Insecurity: No Food Insecurity (06/15/2020)   Hunger Vital Sign    Worried About Running Out of Food in the Last Year: Never true    [place] in the Last Year: Never true  Transportation Needs: No Transportation Needs (06/15/2020)   PRAPARE - Hydrologist (Medical): No    Lack of Transportation (Non-Medical): No  Physical Activity: Not on file  Stress: No Stress Concern Present (06/15/2020)   [place]  Stress Questionnaire    Feeling of Stress : Not at all  Social Connections: Socially Integrated (06/15/2020)   Social Connection and Isolation Panel [NHANES]    Frequency of Communication with Friends and Family: More than three times a week    Frequency of Social Gatherings with Friends and Family: More than three times a week    Attends Religious Services: More than 4 times per year    Active Member of [employer] or Organizations: Yes    Attends Music therapist: More than 4 times per year    Marital Status: Married  Human resources officer Violence: Not At Risk (06/15/2020)   Humiliation, Afraid, Rape, and Kick questionnaire    Fear of Current or Ex-Partner: No    Emotionally Abused: No    Physically Abused: No    Sexually Abused: No   Family History   Problem Relation Age of Onset   Hypertension Mother    Cancer Mother        uterine -METS to lungs   Diabetes Mother    Emphysema Father    Heart disease Father    Heart attack Brother 60       x 2    Heart disease Brother    Hypertension Brother    Diabetes Brother    Crohn's disease Son    Heart attack Paternal Grandfather     Objective: Office vital signs reviewed. BP 121/80   Pulse 79   Temp 97.6 F (36.4 C) (Temporal)   Ht '5\' 8"'$  (1.727 m)   Wt 202 lb 6.4 oz (91.8 kg)   SpO2 95%   BMI 30.77 kg/m   Physical Examination:  General: Awake, alert, well nourished, No acute distress HEENT: sclera white, MMM.  No exophthalmos Cardio: regular rate and rhythm, S1S2 heard, no murmurs appreciated Pulm: clear to auscultation bilaterally, no wheezes, rhonchi or rales; normal work of breathing on room air Extremities: warm, well perfused, No edema, cyanosis or clubbing; +2 pulses bilaterally MSK: Arthritic changes to the bilateral wrist but no gross swelling, erythema or warmth.  No joint effusions Neuro: No tremor Psych: Becomes tearful when talking about [facility] Office Visit from 12/06/2021 in [place]  PHQ-2 Total Score 0        Assessment/ Plan: 71 y.o. male   Hypothyroidism (acquired) - Plan: TSH, T4, Free  Pre-diabetes - Plan: Bayer DCA Hb A1c Waived  Grief reaction  Bilateral wrist pain - Plan: diclofenac Sodium (VOLTAREN) 1 % GEL  Asymptomatic from a thyroid standpoint.  Remains physically active.  Check A1c  He is grieving the loss of his sister.  Has excellent support by his family  For his bilateral wrist pain this is likely osteoarthritic in nature.  Use Tylenol arthritis orally but have also given him samples of diclofenac to apply 2-3 times daily to the affected areas if needed for pain and swelling.  Orders Placed This Encounter  Procedures   Bayer DCA Hb A1c Waived   TSH   T4, Free   No orders of  the defined types were placed in this encounter.    Janora Norlander, DO [place] [phone]

## 2021-12-07 LAB — TSH: TSH: 4.35 u[IU]/mL (ref 0.450–4.500)

## 2021-12-07 LAB — T4, FREE: Free T4: 0.98 ng/dL (ref 0.82–1.77)

## 2022-01-11 ENCOUNTER — Encounter: Payer: Self-pay | Admitting: *Deleted

## 2022-04-12 DIAGNOSIS — M25572 Pain in left ankle and joints of left foot: Secondary | ICD-10-CM | POA: Diagnosis not present

## 2022-04-12 DIAGNOSIS — Z96662 Presence of left artificial ankle joint: Secondary | ICD-10-CM | POA: Diagnosis not present

## 2022-04-12 DIAGNOSIS — M19072 Primary osteoarthritis, left ankle and foot: Secondary | ICD-10-CM | POA: Diagnosis not present

## 2022-04-12 DIAGNOSIS — G8929 Other chronic pain: Secondary | ICD-10-CM | POA: Diagnosis not present

## 2022-04-12 DIAGNOSIS — Z09 Encounter for follow-up examination after completed treatment for conditions other than malignant neoplasm: Secondary | ICD-10-CM | POA: Diagnosis not present

## 2022-04-12 DIAGNOSIS — M19079 Primary osteoarthritis, unspecified ankle and foot: Secondary | ICD-10-CM | POA: Diagnosis not present

## 2022-04-27 NOTE — Progress Notes (Signed)
HPI M never smoker followed for OSA, complicated by HTN, Allergic Rhinitis, Hypothyroid, Cervical Spondylosis, BPH, Hyperlipidemia, Obesity,  NPSG 06/03/12  AHI 13, desaturation to 74%, body weight 201 lbs  ===============================================================  04/27/21- 70 yoM never smoker followed for OSA, complicated by HTN, Allergic Rhinitis, Hypothyroid, Cervical Spondylosis, BPH, Hyperlipidemia, Obesity,  CPAP auto 5-20/ [facility]   replaced 3/22      AirSense 11 AutoSet Download compliance-93%, AHI 0.5/ hr Body weight today-216 lbs Covid vax-3 Phizer Flu vax-had Download reviewed.  He is comfortable with his CPAP and mask-no concerns. Breathing is comfortable with no acute events. Had total left ankle replacement  04/30/22- 71 yoM never smoker followed for OSA, complicated by HTN, Allergic Rhinitis, Hypothyroid, Cervical Spondylosis, BPH, Hyperlipidemia, Obesity,  CPAP auto 5-20/ [facility]   replaced 3/22      AirSense 11 AutoSet Download compliance-100%, AHI 0.3/ hr Body weight today-206 lbs Covid vax-3 Phizer Flu vax- ---Doing great Download reviewed.  Feels he is doing very well with his CPAP.  Has been able to lose some weight.  Fullface mask is bothersome on his nose and we discussed alternatives.  ROS-see HPI   + = positive Constitutional:    weight loss, night sweats, fevers, chills, fatigue, lassitude. HEENT:    headaches, difficulty swallowing, tooth/dental problems, sore throat,       sneezing, itching, ear ache, nasal congestion, post nasal drip, snoring CV:    chest pain, orthopnea, PND, swelling in lower extremities, anasarca,                                   dizziness, palpitations Resp:   shortness of breath with exertion or at rest.                productive cough,   non-productive cough, coughing up of blood.              change in color of mucus.  wheezing.   Skin:    rash or lesions. GI:  No-   heartburn, indigestion, abdominal pain, nausea, vomiting,  diarrhea,                 change in bowel habits, loss of appetite GU: dysuria, change in color of urine, no urgency or frequency.   flank pain. MS:  + joint pain, stiffness, decreased range of motion, back pain. Neuro-     nothing unusual Psych:  change in mood or affect.  depression or anxiety.   memory loss.  OBJ- Physical Exam General- Alert, Oriented, Affect-appropriate, Distress- none acute, +overweight/ stocky Skin- rash-none, lesions- none, excoriation- none Lymphadenopathy- none Head- atraumatic            Eyes- Gross vision intact, PERRLA, conjunctivae and secretions clear            Ears- Hearing, canals-normal            Nose- Clear, no-Septal dev, mucus, polyps, erosion, perforation             Throat- Mallampati IV , mucosa clear , drainage- none, tonsils- atrophic, + extensive bridgework/ repair Neck- flexible , trachea midline, no stridor , thyroid nl, carotid no bruit Chest - symmetrical excursion , unlabored           Heart/CV- RRR , no murmur , no gallop  , no rub, nl s1 s2                           -  JVD- none , edema- none, stasis changes- none, varices- none           Lung- clear to P&A, wheeze- none, cough- none , dullness-none, rub- none           Chest wall-  Abd-  Br/ Gen/ Rectal- Not done, not indicated Extrem- cyanosis- none, clubbing, none, atrophy- none, strength- nl Neuro- grossly intact to observation

## 2022-04-30 ENCOUNTER — Encounter: Payer: Self-pay | Admitting: Internal Medicine

## 2022-04-30 ENCOUNTER — Ambulatory Visit: Payer: Medicare HMO | Admitting: Internal Medicine

## 2022-04-30 VITALS — BP 126/70 | HR 80 | Temp 97.9°F | Ht 68.0 in | Wt 206.2 lb

## 2022-04-30 DIAGNOSIS — G4733 Obstructive sleep apnea (adult) (pediatric): Secondary | ICD-10-CM | POA: Diagnosis not present

## 2022-04-30 DIAGNOSIS — E8881 Metabolic syndrome: Secondary | ICD-10-CM

## 2022-04-30 NOTE — Patient Instructions (Signed)
Order- DME [facility]- Please refit mask for nasal or nasal pillows with chin strap Ccontinue auto 5-20, supplies, air Vieww

## 2022-05-23 NOTE — Assessment & Plan Note (Signed)
CPAP with good compliance and control Plan-continue auto 5-20

## 2022-05-23 NOTE — Assessment & Plan Note (Signed)
Followed by his PCP.  I encouraged continued efforts at weight control.

## 2022-05-29 DIAGNOSIS — G4733 Obstructive sleep apnea (adult) (pediatric): Secondary | ICD-10-CM | POA: Diagnosis not present

## 2022-06-06 ENCOUNTER — Encounter: Payer: Medicare HMO | Admitting: Family Medicine

## 2022-06-11 ENCOUNTER — Ambulatory Visit (INDEPENDENT_AMBULATORY_CARE_PROVIDER_SITE_OTHER): Payer: Medicare HMO | Admitting: Family Medicine

## 2022-06-11 ENCOUNTER — Encounter: Payer: Self-pay | Admitting: Family Medicine

## 2022-06-11 VITALS — BP 100/69 | HR 82 | Temp 98.3°F | Ht 68.0 in | Wt 201.0 lb

## 2022-06-11 DIAGNOSIS — Z1159 Encounter for screening for other viral diseases: Secondary | ICD-10-CM

## 2022-06-11 DIAGNOSIS — Z125 Encounter for screening for malignant neoplasm of prostate: Secondary | ICD-10-CM | POA: Diagnosis not present

## 2022-06-11 DIAGNOSIS — E782 Mixed hyperlipidemia: Secondary | ICD-10-CM

## 2022-06-11 DIAGNOSIS — Z6830 Body mass index (BMI) 30.0-30.9, adult: Secondary | ICD-10-CM

## 2022-06-11 DIAGNOSIS — I10 Essential (primary) hypertension: Secondary | ICD-10-CM

## 2022-06-11 DIAGNOSIS — R202 Paresthesia of skin: Secondary | ICD-10-CM

## 2022-06-11 DIAGNOSIS — R7303 Prediabetes: Secondary | ICD-10-CM

## 2022-06-11 DIAGNOSIS — Z0001 Encounter for general adult medical examination with abnormal findings: Secondary | ICD-10-CM | POA: Diagnosis not present

## 2022-06-11 DIAGNOSIS — E669 Obesity, unspecified: Secondary | ICD-10-CM

## 2022-06-11 DIAGNOSIS — R200 Anesthesia of skin: Secondary | ICD-10-CM | POA: Diagnosis not present

## 2022-06-11 DIAGNOSIS — E039 Hypothyroidism, unspecified: Secondary | ICD-10-CM | POA: Diagnosis not present

## 2022-06-11 DIAGNOSIS — Z0000 Encounter for general adult medical examination without abnormal findings: Secondary | ICD-10-CM

## 2022-06-11 LAB — BAYER DCA HB A1C WAIVED: HB A1C (BAYER DCA - WAIVED): 6.5 % — ABNORMAL HIGH (ref 4.8–5.6)

## 2022-06-11 LAB — LIPID PANEL

## 2022-06-11 LAB — HEPATITIS C ANTIBODY

## 2022-06-11 NOTE — Progress Notes (Signed)
Daniel Macias is a 72 y.o. male presents to office today for annual physical exam examination.    Concerns today include: 1. None. Doing well.  Occupation: has a Licensed conveyancer farm, Marital status: married, Substance use: none Diet: typical [place], Exercise: VERY active, over 12k steps per day Last eye exam: UTD, son does regular eye exams.  Saw an ophthalmologist concerning a spot at the back of the eye which was benign and needs no further follow up Last dental exam: UTD Last colonoscopy: UTD Refills needed today: none Immunizations needed:  wants to hold off on Shingrix #2 Immunization History  Administered Date(s) Administered   Fluad Quad(high Dose 65+) 12/25/2018, 12/26/2019, 12/06/2020   Influenza, High Dose Seasonal PF 11/28/2017   Influenza,inj,Quad PF,6+ Mos 12/01/2014   Influenza-Unspecified 11/12/2012, 12/15/2015   PFIZER(Purple Top)SARS-COV-2 Vaccination 04/05/2019, 04/29/2019, 01/22/2020   Pneumococcal Conjugate-13 06/01/2014, 11/15/2014   Pneumococcal Polysaccharide-23 06/02/2015, 11/14/2015   Tdap 11/25/2012   Zoster Recombinat (Shingrix) 06/01/2020   Zoster, Live 05/28/2012     Past Medical History:  Diagnosis Date   Allergy    Carpal tunnel syndrome, bilateral    Hx of colonic polyps    Hyperlipidemia    Hypertension    Lower leg fracture    Sleep apnea    CPAP   Vitamin D deficiency    Social History   Socioeconomic History   Marital status: Married    Spouse name: linda   Number of children: 2   Years of education: Not on file   Highest education level: Not on file  Occupational History   Occupation: retired     Comment: Holiday representative   Tobacco Use   Smoking status: Never   Smokeless tobacco: Never  Vaping Use   Vaping Use: Never used  Substance and Sexual Activity   Alcohol use: Yes    Comment: occasional   Drug use: No   Sexual activity: Yes  Other Topics Concern   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  His son is Dr Laural Benes, optometrist at [facility] in [place]   Social Determinants of Health   Financial Resource Strain: Low Risk  (06/15/2020)   Overall Financial Resource Strain (CARDIA)    Difficulty of Paying Living Expenses: Not hard at all  Food Insecurity: No Food Insecurity (06/15/2020)   Hunger Vital Sign    Worried About Running Out of Food in the Last Year: Never true    Ran Out of Food in the Last Year: Never true  Transportation Needs: No Transportation Needs (06/15/2020)   PRAPARE - Administrator, Civil Service (Medical): No    Lack of Transportation (Non-Medical): No  Physical Activity: Not on file  Stress: No Stress Concern Present (06/15/2020)   [employer] of Occupational Health - Occupational Stress Questionnaire    Feeling of Stress : Not at all  Social Connections: Socially Integrated (06/15/2020)   Social Connection and Isolation Panel [NHANES]    Frequency of Communication with Friends and Family: More than three times a week    Frequency of Social Gatherings with Friends and Family: More than three times a week    Attends Religious Services: More than 4 times per year    Active Member of [employer] or Organizations: Yes    Attends Banker Meetings: More than 4 times per year    Marital Status: Married  Catering manager Violence: Not At Risk (06/15/2020)   Humiliation, Afraid, Rape, and Kick  questionnaire    Fear of Current or Ex-Partner: No    Emotionally Abused: No    Physically Abused: No    Sexually Abused: No   Past Surgical History:  Procedure Laterality Date   CYST REMOVAL HAND Right    wrist    LEG SURGERY Left 1971   SPINE SURGERY  1999   DDD   TOTAL ANKLE REPLACEMENT Left    Family History  Problem Relation Age of Onset   Hypertension Mother    Cancer Mother        uterine -METS to lungs   Diabetes Mother    Emphysema Father    Heart disease Father    Heart attack Brother 34       x 2    Heart disease Brother     Hypertension Brother    Diabetes Brother    Crohn's disease Son    Heart attack Paternal Grandfather     Current Outpatient Medications:    aspirin EC 81 MG tablet, Take 81 mg by mouth daily., Disp: , Rfl:    azelastine (ASTELIN) 0.1 % nasal spray, Place 2 sprays into both nostrils at bedtime. Use in each nostril as directed, Disp: 30 mL, Rfl: 2   diclofenac Sodium (VOLTAREN) 1 % GEL, Apply 2 g topically 3 (three) times daily as needed., Disp: 200 g, Rfl: 1   Ergocalciferol (VITAMIN D2) 10 MCG (400 UNIT) TABS, , Disp: , Rfl:    fluticasone (FLONASE) 50 MCG/ACT nasal spray, Place 2 sprays into both nostrils daily as needed., Disp: 48 mL, Rfl: 1   levothyroxine (SYNTHROID) 25 MCG tablet, Take 1 tablet (25 mcg total) by mouth daily before breakfast., Disp: 90 tablet, Rfl: 3   Omega-3 Fatty Acids (FISH OIL) 1000 MG CAPS, , Disp: , Rfl:    rosuvastatin (CRESTOR) 10 MG tablet, Take 1 tablet (10 mg total) by mouth daily., Disp: 90 tablet, Rfl: 3  Allergies  Allergen Reactions   Morphine And Related Itching    Other Reaction(s): Other (See Comments)   Lisinopril Cough     ROS: Review of Systems Neurological: some intermittent numbness/ tingling of feet L>R.    Physical exam BP 100/69   Pulse 82   Temp 98.3 F (36.8 C)   Ht 5\' 8"  (1.727 m)   Wt 201 lb (91.2 kg)   SpO2 94%   BMI 30.56 kg/m  General appearance: alert, cooperative, appears stated age, no distress, and mildly obese Head: Normocephalic, without obvious abnormality, atraumatic Eyes: negative findings: lids and lashes normal, conjunctivae and sclerae normal, corneas clear, and pupils equal, round, reactive to light and accomodation.  No exophthalmos Ears: normal TM's and external ear canals both ears Nose: Nares normal. Septum midline. Mucosa normal. No drainage or sinus tenderness. Throat: lips, mucosa, and tongue normal; teeth and gums normal Neck: no adenopathy, no carotid bruit, supple, symmetrical, trachea midline,  and thyroid not enlarged, symmetric, no tenderness/mass/nodules Back: symmetric, no curvature. ROM normal. No CVA tenderness. Lungs: clear to auscultation bilaterally Chest wall: no tenderness Heart: regular rate and rhythm, S1, S2 normal, no murmur, click, rub or gallop Abdomen: soft, non-tender; bowel sounds normal; no masses,  no organomegaly Extremities: extremities normal, atraumatic, no cyanosis or edema Pulses: 2+ and symmetric Skin: Skin color, texture, turgor normal. No rashes or lesions Lymph nodes: Cervical, supraclavicular, and axillary nodes normal. Neurologic: Grossly normal. No tremor Psych: Very pleasant, interactive    06/11/2022    8:10 AM 12/06/2021    9:24  AM 06/07/2021    9:15 AM  Depression screen PHQ 2/9  Decreased Interest 0 0 0  Down, Depressed, Hopeless 0 0 0  PHQ - 2 Score 0 0 0  Altered sleeping 0 0   Tired, decreased energy 0 0   Change in appetite 0 0   Feeling bad or failure about yourself  0 0   Trouble concentrating 0 0   Moving slowly or fidgety/restless 0 0   Suicidal thoughts 0 0   PHQ-9 Score 0 0   Difficult doing work/chores Not difficult at all Not difficult at all       Assessment/ Plan: Jerral Ralph here for annual physical exam.   Annual physical exam  Hypothyroidism (acquired) - Plan: TSH, T4, Free  Pre-diabetes - Plan: Bayer DCA Hb A1c Waived  Mixed hyperlipidemia - Plan: CMP14+EGFR, Lipid Panel  Primary hypertension - Plan: CMP14+EGFR  Screening for malignant neoplasm of prostate - Plan: PSA  Encounter for hepatitis C screening test for low risk patient - Plan: Hepatitis C antibody  Numbness and tingling of both feet - Plan: Vitamin B12  Check fasting labs today.  Has done a great job at abstaining from added sugars.  Hopefully, will see normalized BGs today.  BP well controlled through diet. No changes.  Asymptomatic from a prostate standpoint. Check PSA  Noted some intermittent N/T of feet L>R on ROI.  Check  B12.  Counseled on healthy lifestyle choices, including diet (rich in fruits, vegetables and lean meats and low in salt and simple carbohydrates) and exercise (at least 30 minutes of moderate physical activity daily).  Patient to follow up in 6-12 months for thyroid check  Penina Reisner M. Nadine Counts, DO

## 2022-06-11 NOTE — Patient Instructions (Signed)
You had labs performed today.  You will be contacted with the results of the labs once they are available, usually in the next 3 business days for routine lab work.  If you have an active my chart account, they will be released to your MyChart.  If you prefer to have these labs released to you via telephone, please let us know.   Preventive Care 65 Years and Older, Male Preventive care refers to lifestyle choices and visits with your health care provider that can promote health and wellness. Preventive care visits are also called wellness exams. What can I expect for my preventive care visit? Counseling During your preventive care visit, your health care provider may ask about your: Medical history, including: Past medical problems. Family medical history. History of falls. Current health, including: Emotional well-being. Home life and relationship well-being. Sexual activity. Memory and ability to understand (cognition). Lifestyle, including: Alcohol, nicotine or tobacco, and drug use. Access to firearms. Diet, exercise, and sleep habits. Work and work environment. Sunscreen use. Safety issues such as seatbelt and bike helmet use. Physical exam Your health care provider will check your: Height and weight. These may be used to calculate your BMI (body mass index). BMI is a measurement that tells if you are at a healthy weight. Waist circumference. This measures the distance around your waistline. This measurement also tells if you are at a healthy weight and may help predict your risk of certain diseases, such as type 2 diabetes and high blood pressure. Heart rate and blood pressure. Body temperature. Skin for abnormal spots. What immunizations do I need?  Vaccines are usually given at various ages, according to a schedule. Your health care provider will recommend vaccines for you based on your age, medical history, and lifestyle or other factors, such as travel or where you  work. What tests do I need? Screening Your health care provider may recommend screening tests for certain conditions. This may include: Lipid and cholesterol levels. Diabetes screening. This is done by checking your blood sugar (glucose) after you have not eaten for a while (fasting). Hepatitis C test. Hepatitis B test. HIV (human immunodeficiency virus) test. STI (sexually transmitted infection) testing, if you are at risk. Lung cancer screening. Colorectal cancer screening. Prostate cancer screening. Abdominal aortic aneurysm (AAA) screening. You may need this if you are a current or former smoker. Talk with your health care provider about your test results, treatment options, and if necessary, the need for more tests. Follow these instructions at home: Eating and drinking  Eat a diet that includes fresh fruits and vegetables, whole grains, lean protein, and low-fat dairy products. Limit your intake of foods with high amounts of sugar, saturated fats, and salt. Take vitamin and mineral supplements as recommended by your health care provider. Do not drink alcohol if your health care provider tells you not to drink. If you drink alcohol: Limit how much you have to 0-2 drinks a day. Know how much alcohol is in your drink. In the [place] one drink equals one 12 oz bottle of beer (355 mL), one 5 oz glass of wine (148 mL), or one 1 oz glass of hard liquor (44 mL). Lifestyle Brush your teeth every morning and night with fluoride toothpaste. Floss one time each day. Exercise for at least 30 minutes 5 or more days each week. Do not use any products that contain nicotine or tobacco. These products include cigarettes, chewing tobacco, and vaping devices, such as e-cigarettes. If you need help   quitting, ask your health care provider. Do not use drugs. If you are sexually active, practice safe sex. Use a condom or other form of protection to prevent STIs. Take aspirin only as told by your health  care provider. Make sure that you understand how much to take and what form to take. Work with your health care provider to find out whether it is safe and beneficial for you to take aspirin daily. Ask your health care provider if you need to take a cholesterol-lowering medicine (statin). Find healthy ways to manage stress, such as: Meditation, yoga, or listening to music. Journaling. Talking to a trusted person. Spending time with friends and family. Safety Always wear your seat belt while driving or riding in a vehicle. Do not drive: If you have been drinking alcohol. Do not ride with someone who has been drinking. When you are tired or distracted. While texting. If you have been using any mind-altering substances or drugs. Wear a helmet and other protective equipment during sports activities. If you have firearms in your house, make sure you follow all gun safety procedures. Minimize exposure to UV radiation to reduce your risk of skin cancer. What's next? Visit your health care provider once a year for an annual wellness visit. Ask your health care provider how often you should have your eyes and teeth checked. Stay up to date on all vaccines. This information is not intended to replace advice given to you by your health care provider. Make sure you discuss any questions you have with your health care provider. Document Revised: 07/27/2020 Document Reviewed: 07/27/2020 Elsevier Patient Education  2023 Elsevier Inc.   

## 2022-06-12 LAB — CMP14+EGFR
ALT: 22 IU/L (ref 0–44)
AST: 23 IU/L (ref 0–40)
Albumin/Globulin Ratio: 1.4 (ref 1.2–2.2)
Albumin: 4.3 g/dL (ref 3.8–4.8)
Alkaline Phosphatase: 84 IU/L (ref 44–121)
BUN/Creatinine Ratio: 17 (ref 10–24)
BUN: 15 mg/dL (ref 8–27)
Bilirubin Total: 1 mg/dL (ref 0.0–1.2)
CO2: 22 mmol/L (ref 20–29)
Calcium: 9 mg/dL (ref 8.6–10.2)
Chloride: 102 mmol/L (ref 96–106)
Creatinine, Ser: 0.89 mg/dL (ref 0.76–1.27)
Globulin, Total: 3 g/dL (ref 1.5–4.5)
Glucose: 111 mg/dL — ABNORMAL HIGH (ref 70–99)
Potassium: 4.7 mmol/L (ref 3.5–5.2)
Sodium: 137 mmol/L (ref 134–144)
Total Protein: 7.3 g/dL (ref 6.0–8.5)
eGFR: 90 mL/min/{1.73_m2} (ref >59)

## 2022-06-12 LAB — VITAMIN B12: Vitamin B-12: 558 pg/mL (ref 232–1245)

## 2022-06-12 LAB — LIPID PANEL
Chol/HDL Ratio: 2.1 ratio (ref 0.0–5.0)
Cholesterol, Total: 132 mg/dL (ref 100–199)
HDL: 64 mg/dL (ref >39)
LDL Chol Calc (NIH): 56 mg/dL (ref 0–99)
Triglycerides: 55 mg/dL (ref 0–149)

## 2022-06-12 LAB — TSH: TSH: 2.86 u[IU]/mL (ref 0.450–4.500)

## 2022-06-12 LAB — PSA: Prostate Specific Ag, Serum: 0.7 ng/mL (ref 0.0–4.0)

## 2022-06-12 LAB — T4, FREE: Free T4: 1 ng/dL (ref 0.82–1.77)

## 2022-07-12 DIAGNOSIS — Z96662 Presence of left artificial ankle joint: Secondary | ICD-10-CM | POA: Diagnosis not present

## 2022-07-12 DIAGNOSIS — M19072 Primary osteoarthritis, left ankle and foot: Secondary | ICD-10-CM | POA: Diagnosis not present

## 2022-07-12 DIAGNOSIS — M25572 Pain in left ankle and joints of left foot: Secondary | ICD-10-CM | POA: Diagnosis not present

## 2022-07-12 DIAGNOSIS — Z09 Encounter for follow-up examination after completed treatment for conditions other than malignant neoplasm: Secondary | ICD-10-CM | POA: Diagnosis not present

## 2022-07-12 DIAGNOSIS — G8929 Other chronic pain: Secondary | ICD-10-CM | POA: Diagnosis not present

## 2022-08-09 ENCOUNTER — Other Ambulatory Visit: Payer: Self-pay | Admitting: Family Medicine

## 2022-08-09 DIAGNOSIS — E039 Hypothyroidism, unspecified: Secondary | ICD-10-CM

## 2022-08-24 ENCOUNTER — Other Ambulatory Visit: Payer: Self-pay | Admitting: Family Medicine

## 2022-09-10 ENCOUNTER — Ambulatory Visit: Payer: Medicare HMO | Admitting: Family Medicine

## 2022-09-10 ENCOUNTER — Encounter: Payer: Self-pay | Admitting: Family Medicine

## 2022-09-10 VITALS — BP 123/73 | HR 79 | Temp 97.8°F | Ht 68.0 in | Wt 203.0 lb

## 2022-09-10 DIAGNOSIS — R7303 Prediabetes: Secondary | ICD-10-CM

## 2022-09-10 LAB — BAYER DCA HB A1C WAIVED: HB A1C (BAYER DCA - WAIVED): 6.4 % — ABNORMAL HIGH (ref 4.8–5.6)

## 2022-09-10 NOTE — Progress Notes (Signed)
Subjective: CC: Prediabetes PCP: Raliegh Ip, DO Daniel Macias is a 72 y.o. male presenting to clinic today for:  1.  Prediabetes Patient has been noted to have elevated A1c between 6.4 and lastly 6.5.  At her last visit we discussed diet modification efforts to prevent progression into type 2 diabetes, which is a strong diagnosis in his family.  He admits that he really has not modified diet much since her last visit but he remains very physically active.  He denies any blurred vision, polydipsia, polyurea, chest pain, shortness of breath.  Reports of neuropathy in his feet actually he reports today was simply feeling tight when he curls his toes but no actual numbness and tingling.   ROS: Per HPI  Allergies  Allergen Reactions   Morphine And Codeine Itching    Other Reaction(s): Other (See Comments)   Lisinopril Cough   Past Medical History:  Diagnosis Date   Allergy    Carpal tunnel syndrome, bilateral    Hx of colonic polyps    Hyperlipidemia    Hypertension    Lower leg fracture    Sleep apnea    CPAP   Vitamin D deficiency     Current Outpatient Medications:    azelastine (ASTELIN) 0.1 % nasal spray, Place 2 sprays into both nostrils at bedtime. Use in each nostril as directed, Disp: 30 mL, Rfl: 2   diclofenac Sodium (VOLTAREN) 1 % GEL, Apply 2 g topically 3 (three) times daily as needed., Disp: 200 g, Rfl: 1   Ergocalciferol (VITAMIN D2) 10 MCG (400 UNIT) TABS, , Disp: , Rfl:    fluticasone (FLONASE) 50 MCG/ACT nasal spray, Place 2 sprays into both nostrils daily as needed., Disp: 48 mL, Rfl: 1   levothyroxine (SYNTHROID) 25 MCG tablet, TAKE 1 TABLET BY MOUTH DAILY BEFORE BREAKFAST., Disp: 90 tablet, Rfl: 3   Omega-3 Fatty Acids (FISH OIL) 1000 MG CAPS, , Disp: , Rfl:    rosuvastatin (CRESTOR) 10 MG tablet, TAKE 1 TABLET BY MOUTH EVERY DAY, Disp: 90 tablet, Rfl: 0   aspirin EC 81 MG tablet, Take 81 mg by mouth daily. (Patient not taking: Reported on  09/10/2022), Disp: , Rfl:  Social History   Socioeconomic History   Marital status: Married    Spouse name: linda   Number of children: 2   Years of education: Not on file   Highest education level: Not on file  Occupational History   Occupation: retired     Comment: Holiday representative   Tobacco Use   Smoking status: Never   Smokeless tobacco: Never  Vaping Use   Vaping status: Never Used  Substance and Sexual Activity   Alcohol use: Yes    Comment: occasional   Drug use: No   Sexual activity: Yes  Other Topics Concern   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 Benes, optometrist at [facility] in [place]   Social Determinants of Health   Financial Resource Strain: Low Risk  (01/21/2021)   Received from [facility], [facility]   Overall Financial Resource Strain (CARDIA)    Difficulty of Paying Living Expenses: Not hard at all  Food Insecurity: No Food Insecurity (01/21/2021)   [facility] from [facility], [facility]   Hunger Vital Sign    Worried About Running Out of Food in the Last Year: Never true    Ran Out of Food in the Last Year: Never  true  Transportation Needs: No Transportation Needs (09/10/2022)   PRAPARE - Administrator, Civil Service (Medical): No    Lack of Transportation (Non-Medical): No  Physical Activity: Not on file  Stress: No Stress Concern Present (06/15/2020)   [employer] of Occupational Health - Occupational Stress Questionnaire    Feeling of Stress : Not at all  Social Connections: Socially Integrated (06/15/2020)   Social Connection and Isolation Panel [NHANES]    Frequency of Communication with Friends and Family: More than three times a week    Frequency of Social Gatherings with Friends and Family: More than three times a week    Attends Religious Services: More than 4 times per year    Active Member of [employer] or Organizations: Yes    Attends Engineer, structural:  More than 4 times per year    Marital Status: Married  Catering manager Violence: Not At Risk (06/15/2020)   Humiliation, Afraid, Rape, and Kick questionnaire    Fear of Current or Ex-Partner: No    Emotionally Abused: No    Physically Abused: No    Sexually Abused: No   Family History  Problem Relation Age of Onset   Hypertension Mother    Cancer Mother        uterine -METS to lungs   Diabetes Mother    Emphysema Father    Heart disease Father    Heart attack Brother 33       x 2    Heart disease Brother    Hypertension Brother    Diabetes Brother    Crohn's disease Son    Heart attack Paternal Grandfather     Objective: Office vital signs reviewed. BP 123/73   Pulse 79   Temp 97.8 F (36.6 C) (Temporal)   Ht 5\' 8"  (1.727 m)   Wt 203 lb (92.1 kg)   SpO2 95%   BMI 30.87 kg/m   Physical Examination:  General: Awake, alert, obese, No acute distress HEENT: sclera white, MMM Cardio: RRR  Assessment/ Plan: 72 y.o. male   Pre-diabetes - Plan: Bayer DCA Hb A1c Waived  A1c 6.4 today.  I again had a very long conversation about diet modification and implications of type 2 diabetes diagnosis long-term.  I gave him a handout and reviewed it with him including the my plate method today.  I will reassess him again in 4 to 6 months for blood sugar recheck, sooner if concerns arise  Orders Placed This Encounter  Procedures   Bayer DCA Hb A1c Waived   No orders of the defined types were placed in this encounter.    Raliegh Ip, DO [facility] [phone]

## 2022-09-19 DIAGNOSIS — G4733 Obstructive sleep apnea (adult) (pediatric): Secondary | ICD-10-CM | POA: Diagnosis not present

## 2022-10-04 DIAGNOSIS — H524 Presbyopia: Secondary | ICD-10-CM | POA: Diagnosis not present

## 2022-10-04 DIAGNOSIS — H25813 Combined forms of age-related cataract, bilateral: Secondary | ICD-10-CM | POA: Diagnosis not present

## 2022-10-04 DIAGNOSIS — H52223 Regular astigmatism, bilateral: Secondary | ICD-10-CM | POA: Diagnosis not present

## 2022-10-04 DIAGNOSIS — H5203 Hypermetropia, bilateral: Secondary | ICD-10-CM | POA: Diagnosis not present

## 2022-10-04 DIAGNOSIS — H35363 Drusen (degenerative) of macula, bilateral: Secondary | ICD-10-CM | POA: Diagnosis not present

## 2022-10-20 DIAGNOSIS — G4733 Obstructive sleep apnea (adult) (pediatric): Secondary | ICD-10-CM | POA: Diagnosis not present

## 2022-11-16 ENCOUNTER — Other Ambulatory Visit: Payer: Self-pay | Admitting: Family Medicine

## 2022-11-19 DIAGNOSIS — G4733 Obstructive sleep apnea (adult) (pediatric): Secondary | ICD-10-CM | POA: Diagnosis not present

## 2022-12-10 DIAGNOSIS — D485 Neoplasm of uncertain behavior of skin: Secondary | ICD-10-CM | POA: Diagnosis not present

## 2022-12-10 DIAGNOSIS — L570 Actinic keratosis: Secondary | ICD-10-CM | POA: Diagnosis not present

## 2022-12-10 DIAGNOSIS — L821 Other seborrheic keratosis: Secondary | ICD-10-CM | POA: Diagnosis not present

## 2022-12-10 DIAGNOSIS — C44629 Squamous cell carcinoma of skin of left upper limb, including shoulder: Secondary | ICD-10-CM | POA: Diagnosis not present

## 2022-12-10 DIAGNOSIS — L812 Freckles: Secondary | ICD-10-CM | POA: Diagnosis not present

## 2022-12-10 DIAGNOSIS — Z85828 Personal history of other malignant neoplasm of skin: Secondary | ICD-10-CM | POA: Diagnosis not present

## 2022-12-11 ENCOUNTER — Ambulatory Visit (INDEPENDENT_AMBULATORY_CARE_PROVIDER_SITE_OTHER): Payer: Medicare HMO | Admitting: Family Medicine

## 2022-12-11 ENCOUNTER — Encounter: Payer: Self-pay | Admitting: Family Medicine

## 2022-12-11 VITALS — BP 128/79 | HR 78 | Temp 98.3°F | Ht 68.0 in | Wt 201.0 lb

## 2022-12-11 DIAGNOSIS — Z23 Encounter for immunization: Secondary | ICD-10-CM

## 2022-12-11 DIAGNOSIS — R7303 Prediabetes: Secondary | ICD-10-CM | POA: Diagnosis not present

## 2022-12-11 LAB — BAYER DCA HB A1C WAIVED: HB A1C (BAYER DCA - WAIVED): 6.2 % — ABNORMAL HIGH (ref 4.8–5.6)

## 2022-12-11 NOTE — Progress Notes (Signed)
Subjective: CC: Follow-up prediabetes PCP: Raliegh Ip, DO Daniel Macias is a 72 y.o. male presenting to clinic today for:  1.  Prediabetes Patient reports that he is totally eliminated sweets and bread.  In fact he probably tells me that he went through a cookout drive-through recently and got his wife a milkshake and did not get anything for himself.  He is really been trying to work at avoiding transition over to type 2 diabetes.  He is lost several pounds as a result as well.  Overall he reports feeling good.  No concerns today   ROS: Per HPI  Allergies  Allergen Reactions   Morphine And Codeine Itching    Other Reaction(s): Other (See Comments)   Lisinopril Cough   Past Medical History:  Diagnosis Date   Allergy    Carpal tunnel syndrome, bilateral    Hx of colonic polyps    Hyperlipidemia    Hypertension    Lower leg fracture    Sleep apnea    CPAP   Vitamin D deficiency     Current Outpatient Medications:    aspirin EC 81 MG tablet, Take 81 mg by mouth daily., Disp: , Rfl:    azelastine (ASTELIN) 0.1 % nasal spray, Place 2 sprays into both nostrils at bedtime. Use in each nostril as directed, Disp: 30 mL, Rfl: 2   diclofenac Sodium (VOLTAREN) 1 % GEL, Apply 2 g topically 3 (three) times daily as needed., Disp: 200 g, Rfl: 1   Ergocalciferol (VITAMIN D2) 10 MCG (400 UNIT) TABS, , Disp: , Rfl:    fluticasone (FLONASE) 50 MCG/ACT nasal spray, Place 2 sprays into both nostrils daily as needed., Disp: 48 mL, Rfl: 1   levothyroxine (SYNTHROID) 25 MCG tablet, TAKE 1 TABLET BY MOUTH DAILY BEFORE BREAKFAST., Disp: 90 tablet, Rfl: 3   Omega-3 Fatty Acids (FISH OIL) 1000 MG CAPS, , Disp: , Rfl:    rosuvastatin (CRESTOR) 10 MG tablet, TAKE 1 TABLET BY MOUTH EVERY DAY, Disp: 90 tablet, Rfl: 0 Social History   Socioeconomic History   Marital status: Married    Spouse name: linda   Number of children: 2   Years of education: Not on file   Highest education  level: Not on file  Occupational History   Occupation: retired     Comment: Holiday representative   Tobacco Use   Smoking status: Never   Smokeless tobacco: Never  Vaping Use   Vaping status: Never Used  Substance and Sexual Activity   Alcohol use: Yes    Comment: occasional   Drug use: No   Sexual activity: Yes  Other Topics Concern   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 Benes, optometrist at [facility] in [place]   Social Determinants of Health   Financial Resource Strain: Low Risk  (01/21/2021)   Received from [facility], [facility]   Overall Financial Resource Strain (CARDIA)    Difficulty of Paying Living Expenses: Not hard at all  Food Insecurity: No Food Insecurity (01/21/2021)   [facility], [facility]   Hunger Vital Sign    Worried About Running Out of Food in the Last Year: Never true    Ran Out of Food in the Last Year: Never true  Transportation Needs: No Transportation Needs (09/10/2022)   PRAPARE - Transportation    Lack of Transportation (Medical): No    Lack of Transportation (Non-Medical):  No  Physical Activity: Not on file  Stress: No Stress Concern Present (06/15/2020)   [employer] of Occupational Health - Occupational Stress Questionnaire    Feeling of Stress : Not at all  Social Connections: Socially Integrated (06/15/2020)   Social Connection and Isolation Panel [NHANES]    Frequency of Communication with Friends and Family: More than three times a week    Frequency of Social Gatherings with Friends and Family: More than three times a week    Attends Religious Services: More than 4 times per year    Active Member of [employer] or Organizations: Yes    Attends Engineer, structural: More than 4 times per year    Marital Status: Married  Catering manager Violence: Not At Risk (06/15/2020)   Humiliation, Afraid, Rape, and Kick questionnaire    Fear of Current or Ex-Partner: No     Emotionally Abused: No    Physically Abused: No    Sexually Abused: No   Family History  Problem Relation Age of Onset   Hypertension Mother    Cancer Mother        uterine -METS to lungs   Diabetes Mother    Emphysema Father    Heart disease Father    Heart attack Brother 81       x 2    Heart disease Brother    Hypertension Brother    Diabetes Brother    Crohn's disease Son    Heart attack Paternal Grandfather     Objective: Office vital signs reviewed. BP 128/79   Pulse 78   Temp 98.3 F (36.8 C)   Ht 5\' 8"  (1.727 m)   SpO2 95%   BMI 30.87 kg/m   Physical Examination:  General: Awake, alert, well nourished, No acute distress HEENT: sclera white, MMM Cardio: regular rate and rhythm, S1S2 heard, no murmurs appreciated Pulm: clear to auscultation bilaterally, no wheezes, rhonchi or rales; normal work of breathing on room air   Assessment/ Plan: 72 y.o. male   Pre-diabetes - Plan: Bayer DCA Hb A1c Waived  Need for influenza vaccination  He has done a great job at lifestyle modification with both reduction in weight and A1c, which is 6.2 today.  I congratulated him on this and encouraged him to continue current regimen.  Would like to see him back in 6 months for annual physical with fasting labs, sooner if concerns arise.  Influenza vaccine administered   Raliegh Ip, DO [facility] [phone]

## 2023-01-01 ENCOUNTER — Encounter: Payer: Self-pay | Admitting: Family Medicine

## 2023-01-01 DIAGNOSIS — D049 Carcinoma in situ of skin, unspecified: Secondary | ICD-10-CM | POA: Insufficient documentation

## 2023-01-09 DIAGNOSIS — G4733 Obstructive sleep apnea (adult) (pediatric): Secondary | ICD-10-CM | POA: Diagnosis not present

## 2023-01-24 DIAGNOSIS — Z96662 Presence of left artificial ankle joint: Secondary | ICD-10-CM | POA: Diagnosis not present

## 2023-01-24 DIAGNOSIS — M25572 Pain in left ankle and joints of left foot: Secondary | ICD-10-CM | POA: Diagnosis not present

## 2023-01-24 DIAGNOSIS — Z09 Encounter for follow-up examination after completed treatment for conditions other than malignant neoplasm: Secondary | ICD-10-CM | POA: Diagnosis not present

## 2023-01-24 DIAGNOSIS — G8929 Other chronic pain: Secondary | ICD-10-CM | POA: Diagnosis not present

## 2023-01-24 DIAGNOSIS — M19072 Primary osteoarthritis, left ankle and foot: Secondary | ICD-10-CM | POA: Diagnosis not present

## 2023-02-08 DIAGNOSIS — G4733 Obstructive sleep apnea (adult) (pediatric): Secondary | ICD-10-CM | POA: Diagnosis not present

## 2023-02-12 ENCOUNTER — Other Ambulatory Visit: Payer: Self-pay | Admitting: Family Medicine

## 2023-03-11 DIAGNOSIS — G4733 Obstructive sleep apnea (adult) (pediatric): Secondary | ICD-10-CM | POA: Diagnosis not present

## 2023-03-21 DIAGNOSIS — M1711 Unilateral primary osteoarthritis, right knee: Secondary | ICD-10-CM | POA: Diagnosis not present

## 2023-03-21 DIAGNOSIS — M25561 Pain in right knee: Secondary | ICD-10-CM | POA: Diagnosis not present

## 2023-04-19 DIAGNOSIS — M25832 Other specified joint disorders, left wrist: Secondary | ICD-10-CM | POA: Diagnosis not present

## 2023-04-19 DIAGNOSIS — M19042 Primary osteoarthritis, left hand: Secondary | ICD-10-CM | POA: Diagnosis not present

## 2023-04-19 DIAGNOSIS — M180 Bilateral primary osteoarthritis of first carpometacarpal joints: Secondary | ICD-10-CM | POA: Diagnosis not present

## 2023-04-19 DIAGNOSIS — M19041 Primary osteoarthritis, right hand: Secondary | ICD-10-CM | POA: Diagnosis not present

## 2023-04-19 DIAGNOSIS — M19031 Primary osteoarthritis, right wrist: Secondary | ICD-10-CM | POA: Diagnosis not present

## 2023-04-29 ENCOUNTER — Encounter (INDEPENDENT_AMBULATORY_CARE_PROVIDER_SITE_OTHER): Payer: Self-pay | Admitting: Family Medicine

## 2023-04-29 DIAGNOSIS — I498 Other specified cardiac arrhythmias: Secondary | ICD-10-CM | POA: Diagnosis not present

## 2023-04-29 NOTE — Telephone Encounter (Signed)
 It would be best if you were seen in the office for this first so that the referral could be directed appropriately.

## 2023-04-30 NOTE — Telephone Encounter (Signed)

## 2023-04-30 NOTE — Telephone Encounter (Signed)
 Kelci, please give him an appt slot for labs and let him know when/ what day. Does NOT have to fast

## 2023-05-01 ENCOUNTER — Other Ambulatory Visit

## 2023-05-01 DIAGNOSIS — I498 Other specified cardiac arrhythmias: Secondary | ICD-10-CM

## 2023-05-02 LAB — CMP14+EGFR
ALT: 22 IU/L (ref 0–44)
AST: 22 IU/L (ref 0–40)
Albumin: 4.3 g/dL (ref 3.8–4.8)
Alkaline Phosphatase: 81 IU/L (ref 44–121)
BUN/Creatinine Ratio: 19 (ref 10–24)
BUN: 15 mg/dL (ref 8–27)
Bilirubin Total: 0.8 mg/dL (ref 0.0–1.2)
CO2: 24 mmol/L (ref 20–29)
Calcium: 9.3 mg/dL (ref 8.6–10.2)
Chloride: 99 mmol/L (ref 96–106)
Creatinine, Ser: 0.81 mg/dL (ref 0.76–1.27)
Globulin, Total: 2.8 g/dL (ref 1.5–4.5)
Glucose: 105 mg/dL — ABNORMAL HIGH (ref 70–99)
Potassium: 4.6 mmol/L (ref 3.5–5.2)
Sodium: 136 mmol/L (ref 134–144)
Total Protein: 7.1 g/dL (ref 6.0–8.5)
eGFR: 93 mL/min/{1.73_m2} (ref >59)

## 2023-05-02 LAB — MAGNESIUM: Magnesium: 1.9 mg/dL (ref 1.6–2.3)

## 2023-05-02 LAB — CBC
Hematocrit: 47.8 % (ref 37.5–51.0)
Hemoglobin: 15.8 g/dL (ref 13.0–17.7)
MCH: 29.1 pg (ref 26.6–33.0)
MCHC: 33.1 g/dL (ref 31.5–35.7)
MCV: 88 fL (ref 79–97)
Platelets: 235 10*3/uL (ref 150–450)
RBC: 5.43 x10E6/uL (ref 4.14–5.80)
RDW: 12.8 % (ref 11.6–15.4)
WBC: 6.4 10*3/uL (ref 3.4–10.8)

## 2023-05-02 LAB — TSH: TSH: 3.77 u[IU]/mL (ref 0.450–4.500)

## 2023-05-03 ENCOUNTER — Encounter: Payer: Self-pay | Admitting: Family Medicine

## 2023-05-03 ENCOUNTER — Other Ambulatory Visit: Payer: Self-pay | Admitting: Family Medicine

## 2023-05-03 DIAGNOSIS — E039 Hypothyroidism, unspecified: Secondary | ICD-10-CM

## 2023-05-27 DIAGNOSIS — Z85828 Personal history of other malignant neoplasm of skin: Secondary | ICD-10-CM | POA: Diagnosis not present

## 2023-05-27 DIAGNOSIS — C44722 Squamous cell carcinoma of skin of right lower limb, including hip: Secondary | ICD-10-CM | POA: Diagnosis not present

## 2023-05-27 DIAGNOSIS — L821 Other seborrheic keratosis: Secondary | ICD-10-CM | POA: Diagnosis not present

## 2023-05-27 DIAGNOSIS — D485 Neoplasm of uncertain behavior of skin: Secondary | ICD-10-CM | POA: Diagnosis not present

## 2023-05-27 DIAGNOSIS — D4819 Other specified neoplasm of uncertain behavior of connective and other soft tissue: Secondary | ICD-10-CM | POA: Diagnosis not present

## 2023-05-27 DIAGNOSIS — L570 Actinic keratosis: Secondary | ICD-10-CM | POA: Diagnosis not present

## 2023-06-06 NOTE — Progress Notes (Unsigned)
 Cardiology Office Note   Date:  06/06/2023   ID:  Daniel Macias, Desire [birth year], MRN [id number]  PCP:  Eliodoro Guerin, DO  Cardiologist:   None Referring:  ***  No chief complaint on file.     History of Present Illness: Daniel Macias is a 73 y.o. male who presents for evaluation of palpitations.   ***    I saw him in 2017 for SVT.  This was not frequent and no change in therapy was needed.  .  ***    Past Medical History:  Diagnosis Date   Allergy    Carpal tunnel syndrome, bilateral    Hx of colonic polyps    Hyperlipidemia    Hypertension    Lower leg fracture    Sleep apnea    CPAP   Vitamin D  deficiency     Past Surgical History:  Procedure Laterality Date   CYST REMOVAL HAND Right    wrist    LEG SURGERY Left 1971   SPINE SURGERY  1999   DDD   TOTAL ANKLE REPLACEMENT Left      Current Outpatient Medications  Medication Sig Dispense Refill   aspirin EC 81 MG tablet Take 81 mg by mouth daily.     azelastine  (ASTELIN ) 0.1 % nasal spray Place 2 sprays into both nostrils at bedtime. Use in each nostril as directed 30 mL 2   diclofenac  Sodium (VOLTAREN ) 1 % GEL Apply 2 g topically 3 (three) times daily as needed. 200 g 1   Ergocalciferol  (VITAMIN D2) 10 MCG (400 UNIT) TABS      fluticasone  (FLONASE ) 50 MCG/ACT nasal spray Place 2 sprays into both nostrils daily as needed. 48 mL 1   levothyroxine  (SYNTHROID ) 25 MCG tablet TAKE 1 TABLET BY MOUTH EVERY DAY BEFORE BREAKFAST 90 tablet 0   Omega-3 Fatty Acids (FISH OIL) 1000 MG CAPS      rosuvastatin  (CRESTOR ) 10 MG tablet TAKE 1 TABLET BY MOUTH EVERY DAY 90 tablet 1   No current facility-administered medications for this visit.    Allergies:   Morphine and codeine and Lisinopril     Social History:  The patient  reports that he has never smoked. He has never used smokeless tobacco. He reports current alcohol use. He reports that he does not use drugs.   Family History:  The patient's ***family  history includes Cancer in his mother; Crohn's disease in his son; Diabetes in his brother and mother; Emphysema in his father; Heart attack in his paternal grandfather; Heart attack (age of onset: 38) in his brother; Heart disease in his brother and father; Hypertension in his brother and mother.    ROS:  Please see the history of present illness.   Otherwise, review of systems are positive for {NONE DEFAULTED:18576}.   All other systems are reviewed and negative.    PHYSICAL EXAM: VS:  There were no vitals taken for this visit. , BMI There is no height or weight on file to calculate BMI. GENERAL:  Well appearing HEENT:  Pupils equal round and reactive, fundi not visualized, oral mucosa unremarkable NECK:  No jugular venous distention, waveform within normal limits, carotid upstroke brisk and symmetric, no bruits, no thyromegaly LYMPHATICS:  No cervical, inguinal adenopathy LUNGS:  Clear to auscultation bilaterally BACK:  No CVA tenderness CHEST:  Unremarkable HEART:  PMI not displaced or sustained,S1 and S2 within normal limits, no S3, no S4, no clicks, no rubs, *** murmurs ABD:  Flat, positive  bowel sounds normal in frequency in pitch, no bruits, no rebound, no guarding, no midline pulsatile mass, no hepatomegaly, no splenomegaly EXT:  2 plus pulses throughout, no edema, no cyanosis no clubbing SKIN:  No rashes no nodules NEURO:  Cranial nerves II through XII grossly intact, motor grossly intact throughout PSYCH:  Cognitively intact, oriented to person place and time    EKG:        Recent Labs: 05/01/2023: ALT 22; BUN 15; Creatinine, Ser 0.81; Hemoglobin 15.8; Magnesium 1.9; Platelets 235; Potassium 4.6; Sodium 136; TSH 3.770    Lipid Panel    Component Value Date/Time   CHOL 132 06/11/2022 0840   CHOL 127 05/28/2012 1105   TRIG 55 06/11/2022 0840   TRIG 71 10/18/2016 1246   TRIG 83 05/28/2012 1105   HDL 64 06/11/2022 0840   HDL 54 10/18/2016 1246   HDL 59 05/28/2012 1105    CHOLHDL 2.1 06/11/2022 0840   LDLCALC 56 06/11/2022 0840   LDLCALC 63 11/26/2013 0918   LDLCALC 51 05/28/2012 1105      Wt Readings from Last 3 Encounters:  12/11/22 201 lb (91.2 kg)  09/10/22 203 lb (92.1 kg)  06/11/22 201 lb (91.2 kg)      Other studies Reviewed: Additional studies/ records that were reviewed today include: ***. Review of the above records demonstrates:  Please see elsewhere in the note.  ***   ASSESSMENT AND PLAN:  ***   Current medicines are reviewed at length with the patient today.  The patient {ACTIONS; HAS/DOES NOT HAVE:19233} concerns regarding medicines.  The following changes have been made:  {PLAN; NO CHANGE:13088:s}  Labs/ tests ordered today include: *** No orders of the defined types were placed in this encounter.    Disposition:   FU with ***    Signed, Eilleen Grates, MD  06/06/2023 8:51 PM    [facility]

## 2023-06-07 ENCOUNTER — Ambulatory Visit: Attending: Cardiology

## 2023-06-07 ENCOUNTER — Encounter: Payer: Self-pay | Admitting: Cardiology

## 2023-06-07 ENCOUNTER — Ambulatory Visit: Admitting: Cardiology

## 2023-06-07 VITALS — BP 118/82 | HR 77 | Ht 68.0 in | Wt 195.8 lb

## 2023-06-07 DIAGNOSIS — R002 Palpitations: Secondary | ICD-10-CM

## 2023-06-07 NOTE — Patient Instructions (Signed)
 Medication Instructions:  Your physician recommends that you continue on your current medications as directed. Please refer to the Current Medication list given to you today.  *If you need a refill on your cardiac medications before your next appointment, please call your pharmacy*  Lab Work: NONE   If you have labs (blood work) drawn today and your tests are completely normal, you will receive your results only by: MyChart Message (if you have MyChart) OR A paper copy in the mail If you have any lab test that is abnormal or we need to change your treatment, we will call you to review the results.  Testing/Procedures: ZIO XT- Long Term Monitor Instructions   Your physician has requested you wear your ZIO patch monitor__14___days.   This is a single patch monitor.  [employer] supplies one patch monitor per enrollment.  Additional stickers are not available.   Please do not apply patch if you will be having a Nuclear Stress Test, Echocardiogram, Cardiac CT, MRI, or Chest Xray during the time frame you would be wearing the monitor. The patch cannot be worn during these tests.  You cannot remove and re-apply the ZIO XT patch monitor.   Your ZIO patch monitor will be sent USPS Priority mail from [facility] directly to your home address. The monitor may also be mailed to a PO BOX if home delivery is not available.   It may take 3-5 days to receive your monitor after you have been enrolled.   Once you have received you monitor, please review enclosed instructions.  Your monitor has already been registered assigning a specific monitor serial # to you.   Applying the monitor   Shave hair from upper left chest.   Hold abrader disc by orange tab.  Rub abrader in 40 strokes over left upper chest as indicated in your monitor instructions.   Clean area with 4 enclosed alcohol pads .  Use all pads to assure are is cleaned thoroughly.  Let dry.   Apply patch as indicated in monitor  instructions.  Patch will be place under collarbone on left side of chest with arrow pointing upward.   Rub patch adhesive wings for 2 minutes.Remove white label marked "1".  Remove white label marked "2".  Rub patch adhesive wings for 2 additional minutes.   While looking in a mirror, press and release button in center of patch.  A small green light will flash 3-4 times .  This will be your only indicator the monitor has been turned on.     Do not shower for the first 24 hours.  You may shower after the first 24 hours.   Press button if you feel a symptom. You will hear a small click.  Record Date, Time and Symptom in the Patient Log Book.   When you are ready to remove patch, follow instructions on last 2 pages of Patient Log Book.  Stick patch monitor onto last page of Patient Log Book.   Place Patient Log Book in [place] box.  Use locking tab on box and tape box closed securely.  The Orange and [employer] has [employer] on it.  Please place in mailbox as soon as possible.  Your physician should have your test results approximately 7 days after the monitor has been mailed back to [facility].   Call [facility] Customer Care at [phone] if you have questions regarding your ZIO XT patch monitor.  Call them immediately if you see an orange light blinking  on your monitor.   If your monitor falls off in less than 4 days contact our Monitor department at [phone].  If your monitor becomes loose or falls off after 4 days call [employer] at [phone] for suggestions on securing your monitor.    Follow-Up: At [facility], you and your health needs are our priority.  As part of our continuing mission to provide you with exceptional heart care, our providers are all part of one team.  This team includes your primary Cardiologist (physician) and Advanced Practice Providers or APPs (Physician Assistants and Nurse Practitioners) who all work together to provide you with the care  you need, when you need it.  Your next appointment:    As Needed   Provider:   Eilleen Grates, MD    We recommend signing up for the patient portal called "MyChart".  Sign up information is provided on this After Visit Summary.  MyChart is used to connect with patients for Virtual Visits (Telemedicine).  Patients are able to view lab/test results, encounter notes, upcoming appointments, etc.  Non-urgent messages can be sent to your provider as well.   To learn more about what you can do with MyChart, go to [email or web address].   Other Instructions Thank you for choosing [facility]!

## 2023-06-07 NOTE — Progress Notes (Unsigned)
 Enrolled patient for a 14 day Zio XT  monitor to be mailed to patients home

## 2023-06-11 ENCOUNTER — Encounter: Payer: Self-pay | Admitting: Family Medicine

## 2023-06-11 NOTE — Progress Notes (Unsigned)
 Daniel Macias is a 73 y.o. male presents to office today for annual physical exam examination.    Concerns today include: 1. Tick bite Reports that he has been getting some ticks off of him.  He reports no new onset arthritic changes, fevers or rashes.  He had some old doxycycline  leftover and was not sure if that was something he could still use if needed.  He gets a yearly skin exam and does wear sun protectant  Occupation: Raises goats, Marital status: Married, Substance use: None Health Maintenance Due  Topic Date Due   Zoster Vaccines- Shingrix (2 of 2) 07/27/2020   Medicare Annual Wellness (AWV)  06/15/2021   DTaP/Tdap/Td (2 - Td or Tdap) 11/26/2022   Refills needed today: All  Immunization History  Administered Date(s) Administered   Fluad Quad(high Dose 65+) 12/25/2018, 12/26/2019, 12/06/2020   Fluad Trivalent(High Dose 65+) 12/11/2022   Influenza, High Dose Seasonal PF 11/28/2017   Influenza,inj,Quad PF,6+ Mos 12/01/2014   Influenza-Unspecified 11/12/2012, 12/15/2015   PFIZER(Purple Top)SARS-COV-2 Vaccination 04/05/2019, 04/29/2019, 01/22/2020   Pneumococcal Conjugate-13 06/01/2014, 11/15/2014   Pneumococcal Polysaccharide-23 06/02/2015, 11/14/2015   Tdap 11/25/2012   Zoster Recombinant(Shingrix) 06/01/2020   Zoster, Live 05/28/2012   Past Medical History:  Diagnosis Date   Allergy    Biceps tendon rupture, right, initial encounter 12/25/2018   Carpal tunnel syndrome, bilateral    Hx of colonic polyps    Hyperlipidemia    Hypertension    Lower leg fracture    Sleep apnea    CPAP   Vitamin D  deficiency    Social History   Socioeconomic History   Marital status: Married    Spouse name: linda   Number of children: 2   Years of education: Not on file   Highest education level: 12th grade  Occupational History   Occupation: retired     Comment: Holiday representative   Tobacco Use   Smoking status: Never   Smokeless tobacco: Never  Vaping Use   Vaping status:  Never Used  Substance and Sexual Activity   Alcohol use: Yes    Comment: occasional   Drug use: No   Sexual activity: Yes  Other Topics Concern   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 son is Dr Lincoln Renshaw, optometrist at [facility] in [place]   Social Drivers of Health   Financial Resource Strain: Medium Risk (06/09/2023)   Overall Financial Resource Strain (CARDIA)    Difficulty of Paying Living Expenses: Somewhat hard  Food Insecurity: No Food Insecurity (06/09/2023)   Hunger Vital Sign    Worried About Running Out of Food in the Last Year: Never true    Ran Out of Food in the Last Year: Never true  Transportation Needs: No Transportation Needs (06/09/2023)   PRAPARE - Administrator, Civil Service (Medical): No    Lack of Transportation (Non-Medical): No  Physical Activity: Sufficiently Active (06/09/2023)   Exercise Vital Sign    Days of Exercise per Week: 7 days    Minutes of Exercise per Session: 150+ min  Stress: No Stress Concern Present (06/09/2023)   [employer] of Occupational Health - Occupational Stress Questionnaire    Feeling of Stress : Not at all  Social Connections: Socially Integrated (06/09/2023)   Social Connection and Isolation Panel [NHANES]    Frequency of Communication with Friends and Family: More than three times a week    Frequency of Social Gatherings with Friends and Family:  Twice a week    Attends Religious Services: More than 4 times per year    Active Member of Clubs or Organizations: Yes    Attends Banker Meetings: More than 4 times per year    Marital Status: Married  Catering manager Violence: Not At Risk (06/15/2020)   Humiliation, Afraid, Rape, and Kick questionnaire    Fear of Current or Ex-Partner: No    Emotionally Abused: No    Physically Abused: No    Sexually Abused: No   Past Surgical History:  Procedure Laterality Date   CYST REMOVAL HAND Right    wrist    LEG SURGERY  Left 1971   SPINE SURGERY  1999   DDD   TOTAL ANKLE REPLACEMENT Left    Family History  Problem Relation Age of Onset   Hypertension Mother    Cancer Mother        uterine -METS to lungs   Diabetes Mother    Emphysema Father    Heart disease Father    Heart attack Brother 62       x 2    Heart disease Brother    Hypertension Brother    Diabetes Brother    Crohn's disease Son    Heart attack Paternal Grandfather     Current Outpatient Medications:    aspirin EC 81 MG tablet, Take 81 mg by mouth daily., Disp: , Rfl:    azelastine  (ASTELIN ) 0.1 % nasal spray, Place 2 sprays into both nostrils at bedtime. Use in each nostril as directed, Disp: 30 mL, Rfl: 2   Ergocalciferol  (VITAMIN D2) 10 MCG (400 UNIT) TABS, , Disp: , Rfl:    fluticasone  (FLONASE ) 50 MCG/ACT nasal spray, Place 2 sprays into both nostrils daily as needed., Disp: 48 mL, Rfl: 1   Omega-3 Fatty Acids (FISH OIL) 1000 MG CAPS, , Disp: , Rfl:    levothyroxine  (SYNTHROID ) 25 MCG tablet, Take 1 tablet (25 mcg total) by mouth daily before breakfast., Disp: 90 tablet, Rfl: 3   rosuvastatin  (CRESTOR ) 10 MG tablet, Take 1 tablet (10 mg total) by mouth daily., Disp: 90 tablet, Rfl: 3  Allergies  Allergen Reactions   Morphine And Codeine Itching    Other Reaction(s): Other (See Comments)   Lisinopril  Cough     ROS: Review of Systems Pertinent items noted in HPI and remainder of comprehensive ROS otherwise negative.    Physical exam BP 121/73   Pulse 74   Temp 98.4 F (36.9 C)   Ht 5\' 8"  (1.727 m)   Wt 197 lb (89.4 kg)   SpO2 93%   BMI 29.95 kg/m  General appearance: alert, cooperative, appears stated age, and no distress Head: Normocephalic, without obvious abnormality, atraumatic Eyes: negative findings: lids and lashes normal, conjunctivae and sclerae normal, corneas clear, and pupils equal, round, reactive to light and accomodation Ears: normal TM's and external ear canals both ears Nose: Nares normal.  Septum midline. Mucosa normal. No drainage or sinus tenderness. Throat: lips, mucosa, and tongue normal; teeth and gums normal Neck: no adenopathy, supple, symmetrical, trachea midline, and thyroid  not enlarged, symmetric, no tenderness/mass/nodules Back: symmetric, no curvature. ROM normal. No CVA tenderness. Lungs: clear to auscultation bilaterally Chest wall: no tenderness Heart: regular rate and rhythm, S1, S2 normal, no murmur, click, rub or gallop Abdomen: soft, non-tender; bowel sounds normal; no masses,  no organomegaly Extremities: extremities normal, atraumatic, no cyanosis or edema Pulses: 2+ and symmetric Skin:  Several actinic keratoses noted.  Multiple  areas of hypopigmentation Lymph nodes: Cervical, supraclavicular, and axillary nodes normal. Neurologic: Grossly normal      06/12/2023    8:05 AM 12/11/2022    7:58 AM 09/10/2022    9:45 AM  Depression screen PHQ 2/9  Decreased Interest 0 0 0  Down, Depressed, Hopeless 0 0 0  PHQ - 2 Score 0 0 0  Altered sleeping 0 0 0  Tired, decreased energy 0 0 0  Change in appetite 0 0 0  Feeling bad or failure about yourself  0 0 0  Trouble concentrating 0 0 0  Moving slowly or fidgety/restless 0 0 0  Suicidal thoughts 0 0 0  PHQ-9 Score 0 0 0  Difficult doing work/chores Not difficult at all  Not difficult at all      06/12/2023    8:04 AM 12/11/2022    7:57 AM 09/10/2022    9:45 AM 06/11/2022    8:10 AM  GAD 7 : Generalized Anxiety Score  Nervous, Anxious, on Edge 0 0 0 0  Control/stop worrying 0 0 0 0  Worry too much - different things 0 0 0 0  Trouble relaxing 0 0 0 0  Restless 0 0 0 0  Easily annoyed or irritable 0 0 0 0  Afraid - awful might happen 0 0 0 0  Total GAD 7 Score 0 0 0 0  Anxiety Difficulty Not difficult at all   Not difficult at all     Assessment/ Plan: Daniel Macias here for annual physical exam.   Annual physical exam  Primary hypertension  Hypothyroidism (acquired) - Plan: T4, free,  levothyroxine  (SYNTHROID ) 25 MCG tablet  Obstructive sleep apnea  Mixed hyperlipidemia - Plan: Lipid panel, rosuvastatin  (CRESTOR ) 10 MG tablet  Benign prostatic hyperplasia, unspecified whether lower urinary tract symptoms present - Plan: PSA  Pre-diabetes - Plan: Bayer DCA Hb A1c Waived  Tick bite of right back wall of thorax, initial encounter - Plan: doxycycline  (VIBRA -TABS) 100 MG tablet  Fasting labs collected today.  He will continue all therapies as prescribed.  Refill sent.  Will CC cardiology with lipid panel results  Clinically euthymic.   Prophylactic doxycycline  given for as needed use.  Discussed appropriateness and indication for use.  Wear sun protectant.  Follow-up as needed this issue  Counseled on healthy lifestyle choices, including diet (rich in fruits, vegetables and lean meats and low in salt and simple carbohydrates) and exercise (at least 30 minutes of moderate physical activity daily).  Patient to follow up 1 year  Daniel Macias M. Bonnell Butcher, DO

## 2023-06-12 ENCOUNTER — Encounter: Payer: Self-pay | Admitting: Family Medicine

## 2023-06-12 ENCOUNTER — Ambulatory Visit (INDEPENDENT_AMBULATORY_CARE_PROVIDER_SITE_OTHER): Payer: Medicare HMO | Admitting: Family Medicine

## 2023-06-12 VITALS — BP 121/73 | HR 74 | Temp 98.4°F | Ht 68.0 in | Wt 197.0 lb

## 2023-06-12 DIAGNOSIS — E039 Hypothyroidism, unspecified: Secondary | ICD-10-CM

## 2023-06-12 DIAGNOSIS — I10 Essential (primary) hypertension: Secondary | ICD-10-CM | POA: Diagnosis not present

## 2023-06-12 DIAGNOSIS — S20461A Insect bite (nonvenomous) of right back wall of thorax, initial encounter: Secondary | ICD-10-CM | POA: Diagnosis not present

## 2023-06-12 DIAGNOSIS — N400 Enlarged prostate without lower urinary tract symptoms: Secondary | ICD-10-CM | POA: Diagnosis not present

## 2023-06-12 DIAGNOSIS — R7303 Prediabetes: Secondary | ICD-10-CM | POA: Diagnosis not present

## 2023-06-12 DIAGNOSIS — E782 Mixed hyperlipidemia: Secondary | ICD-10-CM

## 2023-06-12 DIAGNOSIS — W57XXXA Bitten or stung by nonvenomous insect and other nonvenomous arthropods, initial encounter: Secondary | ICD-10-CM

## 2023-06-12 DIAGNOSIS — G4733 Obstructive sleep apnea (adult) (pediatric): Secondary | ICD-10-CM | POA: Diagnosis not present

## 2023-06-12 DIAGNOSIS — Z0001 Encounter for general adult medical examination with abnormal findings: Secondary | ICD-10-CM | POA: Diagnosis not present

## 2023-06-12 DIAGNOSIS — Z0000 Encounter for general adult medical examination without abnormal findings: Secondary | ICD-10-CM

## 2023-06-12 DIAGNOSIS — Z23 Encounter for immunization: Secondary | ICD-10-CM

## 2023-06-12 LAB — BAYER DCA HB A1C WAIVED: HB A1C (BAYER DCA - WAIVED): 6.1 % — ABNORMAL HIGH (ref 4.8–5.6)

## 2023-06-12 MED ORDER — DOXYCYCLINE HYCLATE 100 MG PO TABS: ORAL_TABLET | ORAL | 0 refills | Status: DC

## 2023-06-12 MED ORDER — LEVOTHYROXINE SODIUM 25 MCG PO TABS: 25.0000 ug | ORAL_TABLET | Freq: Every day | ORAL | 3 refills | Status: AC

## 2023-06-12 MED ORDER — ROSUVASTATIN CALCIUM 10 MG PO TABS: 10.0000 mg | ORAL_TABLET | Freq: Every day | ORAL | 3 refills | Status: AC

## 2023-06-12 NOTE — Patient Instructions (Signed)
 Preventive Care 73 Years and Older, Male Preventive care refers to lifestyle choices and visits with your health care provider that can promote health and wellness. Preventive care visits are also called wellness exams. What can I expect for my preventive care visit? Counseling During your preventive care visit, your health care provider may ask about your: Medical history, including: Past medical problems. Family medical history. History of falls. Current health, including: Emotional well-being. Home life and relationship well-being. Sexual activity. Memory and ability to understand (cognition). Lifestyle, including: Alcohol, nicotine or tobacco, and drug use. Access to firearms. Diet, exercise, and sleep habits. Work and work Astronomer. Sunscreen use. Safety issues such as seatbelt and bike helmet use. Physical exam Your health care provider will check your: Height and weight. These may be used to calculate your BMI (body mass index). BMI is a measurement that tells if you are at a healthy weight. Waist circumference. This measures the distance around your waistline. This measurement also tells if you are at a healthy weight and may help predict your risk of certain diseases, such as type 2 diabetes and high blood pressure. Heart rate and blood pressure. Body temperature. Skin for abnormal spots. What immunizations do I need?  Vaccines are usually given at various ages, according to a schedule. Your health care provider will recommend vaccines for you based on your age, medical history, and lifestyle or other factors, such as travel or where you work. What tests do I need? Screening Your health care provider may recommend screening tests for certain conditions. This may include: Lipid and cholesterol levels. Diabetes screening. This is done by checking your blood sugar (glucose) after you have not eaten for a while (fasting). Hepatitis C test. Hepatitis B test. HIV (human  immunodeficiency virus) test. STI (sexually transmitted infection) testing, if you are at risk. Lung cancer screening. Colorectal cancer screening. Prostate cancer screening. Abdominal aortic aneurysm (AAA) screening. You may need this if you are a current or former smoker. Talk with your health care provider about your test results, treatment options, and if necessary, the need for more tests. Follow these instructions at home: Eating and drinking  Eat a diet that includes fresh fruits and vegetables, whole grains, lean protein, and low-fat dairy products. Limit your intake of foods with high amounts of sugar, saturated fats, and salt. Take vitamin and mineral supplements as recommended by your health care provider. Do not drink alcohol if your health care provider tells you not to drink. If you drink alcohol: Limit how much you have to 0-2 drinks a day. Know how much alcohol is in your drink. In the [place] one drink equals one 12 oz bottle of beer (355 mL), one 5 oz glass of wine (148 mL), or one 1 oz glass of hard liquor (44 mL). Lifestyle Brush your teeth every morning and night with fluoride toothpaste. Floss one time each day. Exercise for at least 30 minutes 5 or more days each week. Do not use any products that contain nicotine or tobacco. These products include cigarettes, chewing tobacco, and vaping devices, such as e-cigarettes. If you need help quitting, ask your health care provider. Do not use drugs. If you are sexually active, practice safe sex. Use a condom or other form of protection to prevent STIs. Take aspirin only as told by your health care provider. Make sure that you understand how much to take and what form to take. Work with your health care provider to find out whether it is safe  and beneficial for you to take aspirin daily. Ask your health care provider if you need to take a cholesterol-lowering medicine (statin). Find healthy ways to manage stress, such  as: Meditation, yoga, or listening to music. Journaling. Talking to a trusted person. Spending time with friends and family. Safety Always wear your seat belt while driving or riding in a vehicle. Do not drive: If you have been drinking alcohol. Do not ride with someone who has been drinking. When you are tired or distracted. While texting. If you have been using any mind-altering substances or drugs. Wear a helmet and other protective equipment during sports activities. If you have firearms in your house, make sure you follow all gun safety procedures. Minimize exposure to UV radiation to reduce your risk of skin cancer. What's next? Visit your health care provider once a year for an annual wellness visit. Ask your health care provider how often you should have your eyes and teeth checked. Stay up to date on all vaccines. This information is not intended to replace advice given to you by your health care provider. Make sure you discuss any questions you have with your health care provider. Document Revised: 07/27/2020 Document Reviewed: 07/27/2020 Elsevier Patient Education  2024 [employer].

## 2023-06-13 LAB — LIPID PANEL
Chol/HDL Ratio: 2.2 ratio (ref 0.0–5.0)
Cholesterol, Total: 139 mg/dL (ref 100–199)
HDL: 63 mg/dL (ref >39)
LDL Chol Calc (NIH): 65 mg/dL (ref 0–99)
Triglycerides: 50 mg/dL (ref 0–149)
VLDL Cholesterol Cal: 11 mg/dL (ref 5–40)

## 2023-06-13 LAB — T4, FREE: Free T4: 0.99 ng/dL (ref 0.82–1.77)

## 2023-06-13 LAB — PSA: Prostate Specific Ag, Serum: 0.8 ng/mL (ref 0.0–4.0)

## 2023-07-04 DIAGNOSIS — M25532 Pain in left wrist: Secondary | ICD-10-CM | POA: Diagnosis not present

## 2023-07-04 DIAGNOSIS — M19031 Primary osteoarthritis, right wrist: Secondary | ICD-10-CM | POA: Diagnosis not present

## 2023-07-10 DIAGNOSIS — R002 Palpitations: Secondary | ICD-10-CM | POA: Diagnosis not present

## 2023-07-14 ENCOUNTER — Other Ambulatory Visit: Payer: Self-pay | Admitting: Family Medicine

## 2023-07-14 DIAGNOSIS — R002 Palpitations: Secondary | ICD-10-CM | POA: Diagnosis not present

## 2023-07-14 DIAGNOSIS — S20461A Insect bite (nonvenomous) of right back wall of thorax, initial encounter: Secondary | ICD-10-CM

## 2023-07-18 ENCOUNTER — Ambulatory Visit: Payer: Self-pay | Admitting: Cardiology

## 2023-07-23 ENCOUNTER — Other Ambulatory Visit: Payer: Self-pay

## 2023-07-23 MED ORDER — METOPROLOL SUCCINATE ER 25 MG PO TB24: 25.0000 mg | ORAL_TABLET | Freq: Every day | ORAL | 3 refills | Status: DC

## 2023-08-02 ENCOUNTER — Other Ambulatory Visit: Payer: Self-pay | Admitting: Family Medicine

## 2023-08-02 DIAGNOSIS — W57XXXA Bitten or stung by nonvenomous insect and other nonvenomous arthropods, initial encounter: Secondary | ICD-10-CM

## 2023-08-02 NOTE — Telephone Encounter (Signed)
 Has he used this 10 times already? I just filled it in April

## 2023-08-05 NOTE — Telephone Encounter (Signed)
 Pt states that he has removed about 15 ticks this since his last visit. I instructed pt how he was supposed to take the doxy. He said on the packaging of his script it said to take one tab daily. He thinks he has 2 pills left. Reviewed proper way to take the medication in the future. 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 He would appreciate the refill and will take appropriately next time.

## 2023-08-05 NOTE — Telephone Encounter (Signed)
Please see my previous message

## 2023-08-05 NOTE — Telephone Encounter (Signed)
 Left message on cell vmail to return call  Left message on home hone to return call.  Dr. KANDICE prescribed #20 doxy in April. Pt is to take #2 tabs at once for a tick bite that has been attached for 3 days/ unknown amt of time/ engorged  Please confirm with pt that this has occurred 10x. If so, Dr. KANDICE needs to be made aware for refill.

## 2023-09-05 ENCOUNTER — Other Ambulatory Visit: Payer: Self-pay | Admitting: Family Medicine

## 2023-09-05 DIAGNOSIS — S20461A Insect bite (nonvenomous) of right back wall of thorax, initial encounter: Secondary | ICD-10-CM

## 2023-09-11 NOTE — Progress Notes (Signed)
 Cardiology Office Note:    Date:  09/24/2023   ID:  Daniel Macias, DOB Apr 01, 1950, MRN [id number]  PCP:  Daniel Norene HERO, DO  Cardiologist:  Lynwood Schilling, MD Cardiology APP:  Treveon Bourcier E, PA-C     Referring MD: Daniel Norene HERO, DO   Chief Complaint: follow-up of palpitations  History of Present Illness:    Daniel Macias is a 73 y.o. male with a history of palpitations due to paroxysmal SVT, hypertension, hyperlipidemia, bilateral carpal tunnel syndrome, and sleep apnea who is followed by Dr. Schilling and presents today for follow-up of palpitations.   Patient was initially seen by Dr Schilling in 2015 with rare palpitations. Given how infrequent these episodes were, no monitor was placed. However, he was seen in the ED in 2017 after a long episodes of palpitations and was noted to be in SVT. He converted back to sinus rhythm on his own. Patient did not want to start any AV nodal agents given these episodes were still very infrequent. He was not seen again by Dr. Schilling until recently. He was seen in 05/2023 and reported new onset palpitations. 2 week Zio monitor was ordered and showed infrequent runs of SVT with the longest run lasting 38 seconds. He was started on Toprol -XL 25mg  daily.   Patient presents today for follow-up.  He has done very well since last visit with no recurrent palpitations.  No other cardiac symptoms.  He does report he has felt little more fatigued since starting the Toprol -XL and reports a little leg heaviness as well.  No claudication.  He is wondering whether he needs to stay on the Toprol -XL.  ROS: No chest pain, shortness of breath, orthopnea, PND, lower extremity edema, palpitations, lightheadedness, dizziness, syncope.    EKGs/Labs/Other Studies Reviewed:    The following studies were reviewed:  2 Week Zio Monitor 06/2023: Predominant rhythm was normal sinus. Infrequent runs of SVT with the longest run being 38 seconds. No  sustained arrhythmias.  EKG:  EKG not ordered today.   Recent Labs: 05/01/2023: ALT 22; BUN 15; Creatinine, Ser 0.81; Hemoglobin 15.8; Magnesium 1.9; Platelets 235; Potassium 4.6; Sodium 136; TSH 3.770  Recent Lipid Panel    Component Value Date/Time   CHOL 139 06/12/2023 0834   CHOL 127 05/28/2012 1105   TRIG 50 06/12/2023 0834   TRIG 71 10/18/2016 1246   TRIG 83 05/28/2012 1105   HDL 63 06/12/2023 0834   HDL 54 10/18/2016 1246   HDL 59 05/28/2012 1105   CHOLHDL 2.2 06/12/2023 0834   LDLCALC 65 06/12/2023 0834   LDLCALC 63 11/26/2013 0918   LDLCALC 51 05/28/2012 1105    Physical Exam:    Vital Signs: BP 112/76   Pulse 60   Ht 5' 8 (1.727 m)   Wt 202 lb 6.4 oz (91.8 kg)   SpO2 95%   BMI 30.77 kg/m     Wt Readings from Last 3 Encounters:  09/24/23 202 lb 6.4 oz (91.8 kg)  06/12/23 197 lb (89.4 kg)  06/07/23 195 lb 12.8 oz (88.8 kg)     General: 73 y.o. Caucasian male in no acute distress. HEENT: Normocephalic and atraumatic. Sclera clear.  Neck: Supple. No carotid bruits. No JVD. Heart: RRR. Distinct S1 and S2. No murmurs, gallops, or rubs.  Lungs: No increased work of breathing. Clear to ausculation bilaterally. No wheezes, rhonchi, or rales.  Extremities: No lower extremity edema.  Skin: Warm and dry. Neuro: No focal deficits. Psych: Normal affect.  Responds appropriately.   Assessment:    1. Palpitations   2. Paroxysmal SVT (supraventricular tachycardia) (HCC)   3. Primary hypertension   4. Hyperlipidemia, unspecified hyperlipidemia type     Plan:    Palpitations Paroxysmal SVT Patient has a long history of palpitations due to SVT. Recent 2 week Zio monitor in 06/2023 showed 3 runs of SVT with the longest run lasting 38 seconds but no other significant arrhythmias.  - Well controlled. No recurrent palpitations since starting Toprol -XL. However, he does report feeling a little more fatigued with this. - Currently on Toprol -XL 25mg  daily; however, he is  wondering if needs to continue this.  - Discussed continue Toprol -XL, switching to daily Cardizem to see if that helped his fatigue, or switching to PRN Lopressor . He would like to switch to PRN Lopressor , so will provide Lopressor  25mg  that he can PRN (up to twice a day) for sustained palpitations lasting >10 years.  - Advised him to let us  know if he has any worsening palpitations after switching to PRN dosing.  Hypertension BP well controlled.  - Only on Toprol -XL but this is for palpitations not hypertension. Switching to PRN Lopressor  as above.  Hyperlipidemia Lipid panel in 05/2023: Total Cholesterol 139, Triglycerides 50, HDL 63, LDL 65.  - Continue Crestor  10mg  daily.  - Followed by PCP.  Obstructive Sleep Apnea - Continue CPAP.  Disposition: Follow up in 6 months.   Signed, Aline FORBES Door, PA-C  09/24/2023 10:38 AM    [facility]

## 2023-09-24 ENCOUNTER — Encounter: Payer: Self-pay | Admitting: Student

## 2023-09-24 ENCOUNTER — Ambulatory Visit: Admitting: Nurse Practitioner

## 2023-09-24 ENCOUNTER — Ambulatory Visit: Attending: Student | Admitting: Student

## 2023-09-24 VITALS — BP 112/76 | HR 60 | Ht 68.0 in | Wt 202.4 lb

## 2023-09-24 DIAGNOSIS — I471 Supraventricular tachycardia, unspecified: Secondary | ICD-10-CM

## 2023-09-24 DIAGNOSIS — R002 Palpitations: Secondary | ICD-10-CM

## 2023-09-24 DIAGNOSIS — I10 Essential (primary) hypertension: Secondary | ICD-10-CM

## 2023-09-24 DIAGNOSIS — E785 Hyperlipidemia, unspecified: Secondary | ICD-10-CM

## 2023-09-24 MED ORDER — METOPROLOL TARTRATE 25 MG PO TABS: ORAL_TABLET | ORAL | 3 refills | Status: DC

## 2023-09-24 NOTE — Patient Instructions (Signed)
 Medication Instructions:  Your physician has recommended you make the following change in your medication:   STOP Toprol  XL  START Lopressor  25 mg  TAKE 1 TABLET BY MOUTH DAILY ONLY AS NEEDED FOR SUSTAINED PALPS GREATER THEN 10 MINS, MAY TAKE 1 ADDITIONAL TABLET DAILY AS NEEDED  *If you need a refill on your cardiac medications before your next appointment, please call your pharmacy*  Lab Work: None ordered  If you have labs (blood work) drawn today and your tests are completely normal, you will receive your results only by: MyChart Message (if you have MyChart) OR A paper copy in the mail If you have any lab test that is abnormal or we need to change your treatment, we will call you to review the results.  Testing/Procedures: None ordered  Follow-Up: At [facility], you and your health needs are our priority.  As part of our continuing mission to provide you with exceptional heart care, our providers are all part of one team.  This team includes your primary Cardiologist (physician) and Advanced Practice Providers or APPs (Physician Assistants and Nurse Practitioners) who all work together to provide you with the care you need, when you need it.  Your next appointment:   6 month(s)  Provider:   None or Callie Goodrich, PA-C          We recommend signing up for the patient portal called MyChart.  Sign up information is provided on this After Visit Summary.  MyChart is used to connect with patients for Virtual Visits (Telemedicine).  Patients are able to view lab/test results, encounter notes, upcoming appointments, etc.  Non-urgent messages can be sent to your provider as well.   To learn more about what you can do with MyChart, go to [email or web address].   Other Instructions

## 2023-10-09 DIAGNOSIS — H5203 Hypermetropia, bilateral: Secondary | ICD-10-CM | POA: Diagnosis not present

## 2023-10-09 DIAGNOSIS — H52223 Regular astigmatism, bilateral: Secondary | ICD-10-CM | POA: Diagnosis not present

## 2023-10-09 DIAGNOSIS — H35363 Drusen (degenerative) of macula, bilateral: Secondary | ICD-10-CM | POA: Diagnosis not present

## 2023-10-09 DIAGNOSIS — H25813 Combined forms of age-related cataract, bilateral: Secondary | ICD-10-CM | POA: Diagnosis not present

## 2023-10-09 DIAGNOSIS — H524 Presbyopia: Secondary | ICD-10-CM | POA: Diagnosis not present

## 2023-10-17 DIAGNOSIS — G4733 Obstructive sleep apnea (adult) (pediatric): Secondary | ICD-10-CM | POA: Diagnosis not present

## 2023-11-22 ENCOUNTER — Other Ambulatory Visit: Payer: Self-pay | Admitting: Student

## 2023-12-25 DIAGNOSIS — L821 Other seborrheic keratosis: Secondary | ICD-10-CM | POA: Diagnosis not present

## 2023-12-25 DIAGNOSIS — L570 Actinic keratosis: Secondary | ICD-10-CM | POA: Diagnosis not present

## 2023-12-25 DIAGNOSIS — L578 Other skin changes due to chronic exposure to nonionizing radiation: Secondary | ICD-10-CM | POA: Diagnosis not present

## 2023-12-25 DIAGNOSIS — D225 Melanocytic nevi of trunk: Secondary | ICD-10-CM | POA: Diagnosis not present

## 2023-12-25 DIAGNOSIS — Z85828 Personal history of other malignant neoplasm of skin: Secondary | ICD-10-CM | POA: Diagnosis not present

## 2023-12-25 DIAGNOSIS — D2261 Melanocytic nevi of right upper limb, including shoulder: Secondary | ICD-10-CM | POA: Diagnosis not present

## 2023-12-25 DIAGNOSIS — L812 Freckles: Secondary | ICD-10-CM | POA: Diagnosis not present

## 2024-02-12 ENCOUNTER — Ambulatory Visit: Admitting: Family Medicine

## 2024-06-01 ENCOUNTER — Ambulatory Visit: Admitting: Family Medicine
